# Patient Record
Sex: Female | Born: 1970
Health system: Southern US, Community
[De-identification: ages and names within clinical notes are randomized; demographics above are authoritative.]

## PROBLEM LIST (undated history)

## (undated) DIAGNOSIS — I2510 Atherosclerotic heart disease of native coronary artery without angina pectoris: Secondary | ICD-10-CM

## (undated) DIAGNOSIS — E119 Type 2 diabetes mellitus without complications: Secondary | ICD-10-CM

## (undated) DIAGNOSIS — K219 Gastro-esophageal reflux disease without esophagitis: Secondary | ICD-10-CM

## (undated) DIAGNOSIS — M5136 Other intervertebral disc degeneration, lumbar region: Secondary | ICD-10-CM

## (undated) DIAGNOSIS — I5032 Chronic diastolic (congestive) heart failure: Secondary | ICD-10-CM

## (undated) DIAGNOSIS — M51369 Other intervertebral disc degeneration, lumbar region without mention of lumbar back pain or lower extremity pain: Secondary | ICD-10-CM

## (undated) DIAGNOSIS — F329 Major depressive disorder, single episode, unspecified: Secondary | ICD-10-CM

## (undated) DIAGNOSIS — I10 Essential (primary) hypertension: Secondary | ICD-10-CM

## (undated) DIAGNOSIS — E039 Hypothyroidism, unspecified: Secondary | ICD-10-CM

## (undated) DIAGNOSIS — M5126 Other intervertebral disc displacement, lumbar region: Secondary | ICD-10-CM

## (undated) DIAGNOSIS — E785 Hyperlipidemia, unspecified: Secondary | ICD-10-CM

## (undated) HISTORY — DX: Gastro-esophageal reflux disease without esophagitis: K21.9

## (undated) HISTORY — DX: Atherosclerotic heart disease of native coronary artery without angina pectoris: I25.10

## (undated) HISTORY — DX: Chronic diastolic (congestive) heart failure: I50.32

## (undated) HISTORY — DX: Hypothyroidism, unspecified: E03.9

## (undated) HISTORY — DX: Hyperlipidemia, unspecified: E78.5

## (undated) HISTORY — PX: DILATION AND CURETTAGE OF UTERUS: SHX78

## (undated) HISTORY — DX: Major depressive disorder, single episode, unspecified: F32.9

## (undated) HISTORY — DX: Morbid (severe) obesity due to excess calories: E66.01

---

## 2014-06-21 ENCOUNTER — Encounter (HOSPITAL_COMMUNITY): Admission: EM | Disposition: A | Discharge status: Home / Self Care | Payer: Self-pay | Source: Home / Self Care

## 2014-06-21 ENCOUNTER — Other Ambulatory Visit (HOSPITAL_COMMUNITY): Payer: Self-pay

## 2014-06-21 ENCOUNTER — Emergency Department (HOSPITAL_COMMUNITY): Payer: Self-pay

## 2014-06-21 ENCOUNTER — Emergency Department (HOSPITAL_COMMUNITY): Payer: Self-pay | Admitting: Certified Registered"

## 2014-06-21 ENCOUNTER — Inpatient Hospital Stay (HOSPITAL_COMMUNITY)
Admission: EM | Admit: 2014-06-21 | Discharge: 2014-06-24 | DRG: 345 | Disposition: A | Discharge status: Home / Self Care | Payer: Self-pay | Attending: General Surgery | Admitting: General Surgery

## 2014-06-21 ENCOUNTER — Encounter (HOSPITAL_COMMUNITY): Payer: Self-pay | Admitting: Cardiology

## 2014-06-21 DIAGNOSIS — M726 Necrotizing fasciitis: Secondary | ICD-10-CM

## 2014-06-21 DIAGNOSIS — K611 Rectal abscess: Principal | ICD-10-CM | POA: Diagnosis present

## 2014-06-21 DIAGNOSIS — Z881 Allergy status to other antibiotic agents status: Secondary | ICD-10-CM

## 2014-06-21 DIAGNOSIS — E119 Type 2 diabetes mellitus without complications: Secondary | ICD-10-CM | POA: Diagnosis present

## 2014-06-21 DIAGNOSIS — E282 Polycystic ovarian syndrome: Secondary | ICD-10-CM | POA: Diagnosis present

## 2014-06-21 DIAGNOSIS — N920 Excessive and frequent menstruation with regular cycle: Secondary | ICD-10-CM | POA: Diagnosis present

## 2014-06-21 DIAGNOSIS — G629 Polyneuropathy, unspecified: Secondary | ICD-10-CM | POA: Diagnosis present

## 2014-06-21 DIAGNOSIS — Z91040 Latex allergy status: Secondary | ICD-10-CM

## 2014-06-21 DIAGNOSIS — L0231 Cutaneous abscess of buttock: Secondary | ICD-10-CM | POA: Diagnosis present

## 2014-06-21 DIAGNOSIS — B951 Streptococcus, group B, as the cause of diseases classified elsewhere: Secondary | ICD-10-CM | POA: Diagnosis present

## 2014-06-21 HISTORY — PX: INCISION AND DRAINAGE PERIRECTAL ABSCESS: SHX1804

## 2014-06-21 HISTORY — DX: Other intervertebral disc degeneration, lumbar region: M51.36

## 2014-06-21 HISTORY — DX: Other intervertebral disc degeneration, lumbar region without mention of lumbar back pain or lower extremity pain: M51.369

## 2014-06-21 HISTORY — DX: Essential (primary) hypertension: I10

## 2014-06-21 HISTORY — DX: Type 2 diabetes mellitus without complications: E11.9

## 2014-06-21 HISTORY — DX: Other intervertebral disc displacement, lumbar region: M51.26

## 2014-06-21 LAB — BASIC METABOLIC PANEL
Anion gap: 12 (ref 5–15)
BUN: 10 mg/dL (ref 6–20)
CALCIUM: 8.9 mg/dL (ref 8.9–10.3)
CO2: 22 mmol/L (ref 22–32)
Chloride: 99 mmol/L — ABNORMAL LOW (ref 101–111)
Creatinine, Ser: 0.64 mg/dL (ref 0.44–1.00)
GFR calc non Af Amer: >60 mL/min (ref >60)
Glucose, Bld: 350 mg/dL — ABNORMAL HIGH (ref 65–99)
POTASSIUM: 4.2 mmol/L (ref 3.5–5.1)
Sodium: 133 mmol/L — ABNORMAL LOW (ref 135–145)

## 2014-06-21 LAB — CBC WITH DIFFERENTIAL/PLATELET
BASOS PCT: 0 % (ref 0–1)
Basophils Absolute: 0 10*3/uL (ref 0.0–0.1)
EOS ABS: 0 10*3/uL (ref 0.0–0.7)
EOS PCT: 0 % (ref 0–5)
HEMATOCRIT: 39.6 % (ref 36.0–46.0)
Hemoglobin: 12.9 g/dL (ref 12.0–15.0)
Lymphocytes Relative: 10 % — ABNORMAL LOW (ref 12–46)
Lymphs Abs: 1.6 10*3/uL (ref 0.7–4.0)
MCH: 25.3 pg — ABNORMAL LOW (ref 26.0–34.0)
MCHC: 32.6 g/dL (ref 30.0–36.0)
MCV: 77.6 fL — AB (ref 78.0–100.0)
MONO ABS: 1.1 10*3/uL — AB (ref 0.1–1.0)
Monocytes Relative: 7 % (ref 3–12)
NEUTROS ABS: 12.6 10*3/uL — AB (ref 1.7–7.7)
NEUTROS PCT: 83 % — AB (ref 43–77)
Platelets: 234 10*3/uL (ref 150–400)
RBC: 5.1 MIL/uL (ref 3.87–5.11)
RDW: 14.7 % (ref 11.5–15.5)
WBC: 15.3 10*3/uL — ABNORMAL HIGH (ref 4.0–10.5)

## 2014-06-21 LAB — GLUCOSE, CAPILLARY: GLUCOSE-CAPILLARY: 354 mg/dL — AB (ref 65–99)

## 2014-06-21 LAB — CBG MONITORING, ED
GLUCOSE-CAPILLARY: 317 mg/dL — AB (ref 65–99)
GLUCOSE-CAPILLARY: 354 mg/dL — AB (ref 65–99)

## 2014-06-21 LAB — HCG, QUANTITATIVE, PREGNANCY: hCG, Beta Chain, Quant, S: <1 m[IU]/mL (ref <5)

## 2014-06-21 SURGERY — INCISION AND DRAINAGE, ABSCESS, PERIRECTAL
Anesthesia: General | Site: Buttocks | Laterality: Bilateral

## 2014-06-21 MED ORDER — LEVOTHYROXINE SODIUM 50 MCG PO TABS
50.0000 ug | ORAL_TABLET | Freq: Every day | ORAL | Status: DC
  Administered 2014-06-22 – 2014-06-24 (×3): 50 ug via ORAL
  Filled 2014-06-21 (×3): qty 1

## 2014-06-21 MED ORDER — INSULIN ASPART 100 UNIT/ML ~~LOC~~ SOLN
0.0000 [IU] | Freq: Three times a day (TID) | SUBCUTANEOUS | Status: DC
  Administered 2014-06-21: 20 [IU] via SUBCUTANEOUS
  Administered 2014-06-22: 11 [IU] via SUBCUTANEOUS
  Administered 2014-06-22: 6 [IU] via SUBCUTANEOUS
  Administered 2014-06-22: 7 [IU] via SUBCUTANEOUS
  Administered 2014-06-23: 11 [IU] via SUBCUTANEOUS
  Administered 2014-06-23: 7 [IU] via SUBCUTANEOUS
  Administered 2014-06-23: 15 [IU] via SUBCUTANEOUS
  Administered 2014-06-24: 4 [IU] via SUBCUTANEOUS

## 2014-06-21 MED ORDER — SODIUM CHLORIDE 0.9 % IV BOLUS (SEPSIS)
1000.0000 mL | Freq: Once | INTRAVENOUS | Status: AC
  Administered 2014-06-21: 1000 mL via INTRAVENOUS

## 2014-06-21 MED ORDER — MORPHINE SULFATE 4 MG/ML IJ SOLN
4.0000 mg | Freq: Once | INTRAMUSCULAR | Status: AC
  Administered 2014-06-21: 4 mg via INTRAVENOUS
  Filled 2014-06-21: qty 1

## 2014-06-21 MED ORDER — INSULIN ASPART 100 UNIT/ML ~~LOC~~ SOLN
SUBCUTANEOUS | Status: AC
  Filled 2014-06-21: qty 20

## 2014-06-21 MED ORDER — ONDANSETRON HCL 4 MG/2ML IJ SOLN
4.0000 mg | Freq: Once | INTRAMUSCULAR | Status: AC
  Administered 2014-06-21: 4 mg via INTRAVENOUS
  Filled 2014-06-21: qty 2

## 2014-06-21 MED ORDER — ACETAMINOPHEN 650 MG RE SUPP
650.0000 mg | Freq: Once | RECTAL | Status: AC
  Administered 2014-06-21: 650 mg via RECTAL

## 2014-06-21 MED ORDER — LIDOCAINE HCL (CARDIAC) 20 MG/ML IV SOLN
INTRAVENOUS | Status: DC | PRN
  Administered 2014-06-21: 60 mg via INTRAVENOUS

## 2014-06-21 MED ORDER — 0.9 % SODIUM CHLORIDE (POUR BTL) OPTIME
TOPICAL | Status: DC | PRN
  Administered 2014-06-21: 1000 mL

## 2014-06-21 MED ORDER — ATENOLOL 50 MG PO TABS
50.0000 mg | ORAL_TABLET | Freq: Every day | ORAL | Status: DC
  Administered 2014-06-22 – 2014-06-24 (×3): 50 mg via ORAL
  Filled 2014-06-21 (×3): qty 1

## 2014-06-21 MED ORDER — PIPERACILLIN-TAZOBACTAM 3.375 G IVPB
3.3750 g | Freq: Three times a day (TID) | INTRAVENOUS | Status: DC
  Administered 2014-06-21 – 2014-06-24 (×8): 3.375 g via INTRAVENOUS
  Filled 2014-06-21 (×9): qty 50

## 2014-06-21 MED ORDER — PROPOFOL 10 MG/ML IV BOLUS
INTRAVENOUS | Status: AC
Start: 2014-06-21 — End: 2014-06-21
  Filled 2014-06-21: qty 20

## 2014-06-21 MED ORDER — INSULIN ASPART 100 UNIT/ML ~~LOC~~ SOLN
0.0000 [IU] | Freq: Every day | SUBCUTANEOUS | Status: DC
  Administered 2014-06-22 – 2014-06-23 (×2): 2 [IU] via SUBCUTANEOUS

## 2014-06-21 MED ORDER — MORPHINE SULFATE 4 MG/ML IJ SOLN: 4.0000 mg | Freq: Once | INTRAMUSCULAR | Status: DC

## 2014-06-21 MED ORDER — SODIUM CHLORIDE 0.9 % IV SOLN
INTRAVENOUS | Status: DC
  Administered 2014-06-21 – 2014-06-22 (×3): via INTRAVENOUS

## 2014-06-21 MED ORDER — MORPHINE SULFATE 4 MG/ML IJ SOLN
8.0000 mg | Freq: Once | INTRAMUSCULAR | Status: AC
  Administered 2014-06-21: 8 mg via INTRAVENOUS
  Filled 2014-06-21: qty 2

## 2014-06-21 MED ORDER — PIPERACILLIN-TAZOBACTAM 3.375 G IVPB 30 MIN
3.3750 g | Freq: Once | INTRAVENOUS | Status: AC
  Administered 2014-06-21: 3.375 g via INTRAVENOUS
  Filled 2014-06-21: qty 50

## 2014-06-21 MED ORDER — ACETAMINOPHEN 650 MG RE SUPP
RECTAL | Status: AC
  Filled 2014-06-21: qty 1

## 2014-06-21 MED ORDER — FENTANYL CITRATE (PF) 100 MCG/2ML IJ SOLN: 25.0000 ug | INTRAMUSCULAR | Status: DC | PRN

## 2014-06-21 MED ORDER — SODIUM CHLORIDE 0.9 % IV SOLN
1000.0000 mL | Freq: Once | INTRAVENOUS | Status: AC
  Administered 2014-06-21: 1000 mL via INTRAVENOUS

## 2014-06-21 MED ORDER — VANCOMYCIN HCL IN DEXTROSE 1-5 GM/200ML-% IV SOLN
1000.0000 mg | Freq: Once | INTRAVENOUS | Status: AC
  Administered 2014-06-21: 1000 mg via INTRAVENOUS
  Filled 2014-06-21: qty 200

## 2014-06-21 MED ORDER — MIDAZOLAM HCL 2 MG/2ML IJ SOLN
INTRAMUSCULAR | Status: AC
  Filled 2014-06-21: qty 2

## 2014-06-21 MED ORDER — INSULIN ASPART 100 UNIT/ML ~~LOC~~ SOLN
10.0000 [IU] | Freq: Once | SUBCUTANEOUS | Status: AC
  Administered 2014-06-21: 10 [IU] via SUBCUTANEOUS
  Filled 2014-06-21: qty 0.1

## 2014-06-21 MED ORDER — SODIUM CHLORIDE 0.9 % IV SOLN
INTRAVENOUS | Status: DC | PRN
  Administered 2014-06-21 (×2): via INTRAVENOUS

## 2014-06-21 MED ORDER — FENTANYL CITRATE (PF) 250 MCG/5ML IJ SOLN
INTRAMUSCULAR | Status: AC
  Filled 2014-06-21: qty 5

## 2014-06-21 MED ORDER — ENOXAPARIN SODIUM 40 MG/0.4ML ~~LOC~~ SOLN
40.0000 mg | SUBCUTANEOUS | Status: DC
  Administered 2014-06-23: 40 mg via SUBCUTANEOUS
  Filled 2014-06-21 (×2): qty 0.4

## 2014-06-21 MED ORDER — ONDANSETRON HCL 4 MG/2ML IJ SOLN
4.0000 mg | Freq: Four times a day (QID) | INTRAMUSCULAR | Status: DC | PRN
  Administered 2014-06-22 – 2014-06-24 (×2): 4 mg via INTRAVENOUS
  Filled 2014-06-21 (×2): qty 2

## 2014-06-21 MED ORDER — OXYCODONE-ACETAMINOPHEN 5-325 MG PO TABS
1.0000 | ORAL_TABLET | ORAL | Status: DC | PRN
  Administered 2014-06-22: 2 via ORAL
  Filled 2014-06-21: qty 2

## 2014-06-21 MED ORDER — IOHEXOL 300 MG/ML  SOLN
100.0000 mL | Freq: Once | INTRAMUSCULAR | Status: AC | PRN
  Administered 2014-06-21: 100 mL via INTRAVENOUS

## 2014-06-21 MED ORDER — INSULIN ASPART 100 UNIT/ML ~~LOC~~ SOLN
6.0000 [IU] | Freq: Three times a day (TID) | SUBCUTANEOUS | Status: DC
  Administered 2014-06-22 – 2014-06-23 (×6): 6 [IU] via SUBCUTANEOUS

## 2014-06-21 MED ORDER — PROPOFOL 10 MG/ML IV BOLUS
INTRAVENOUS | Status: DC | PRN
  Administered 2014-06-21: 200 mg via INTRAVENOUS

## 2014-06-21 MED ORDER — GABAPENTIN 300 MG PO CAPS
600.0000 mg | ORAL_CAPSULE | Freq: Every day | ORAL | Status: DC
  Administered 2014-06-21 – 2014-06-23 (×3): 600 mg via ORAL
  Filled 2014-06-21 (×3): qty 2

## 2014-06-21 MED ORDER — HYDROMORPHONE HCL 1 MG/ML IJ SOLN
1.0000 mg | INTRAMUSCULAR | Status: DC | PRN
  Administered 2014-06-22 – 2014-06-24 (×6): 1 mg via INTRAVENOUS
  Filled 2014-06-21 (×6): qty 1

## 2014-06-21 MED ORDER — ONDANSETRON HCL 4 MG PO TABS: 4.0000 mg | ORAL_TABLET | Freq: Four times a day (QID) | ORAL | Status: DC | PRN

## 2014-06-21 MED ORDER — BUPIVACAINE HCL 0.5 % IJ SOLN
INTRAMUSCULAR | Status: DC | PRN
  Administered 2014-06-21: 20 mL

## 2014-06-21 MED ORDER — FENTANYL CITRATE (PF) 100 MCG/2ML IJ SOLN
INTRAMUSCULAR | Status: DC | PRN
  Administered 2014-06-21: 50 ug via INTRAVENOUS
  Administered 2014-06-21: 100 ug via INTRAVENOUS

## 2014-06-21 SURGICAL SUPPLY — 40 items
BARD SURE STEP FOLEY TRAY IMPLANT
BNDG GAUZE ELAST 4 BULKY (GAUZE/BANDAGES/DRESSINGS) ×3 IMPLANT
CANISTER SUCTION 2500CC (MISCELLANEOUS) ×3 IMPLANT
COVER SURGICAL LIGHT HANDLE (MISCELLANEOUS) ×3 IMPLANT
DRAPE UTILITY XL STRL (DRAPES) ×6 IMPLANT
DRSG PAD ABDOMINAL 8X10 ST (GAUZE/BANDAGES/DRESSINGS) ×3 IMPLANT
ELECT CAUTERY BLADE 6.4 (BLADE) ×3 IMPLANT
ELECT REM PT RETURN 9FT ADLT (ELECTROSURGICAL) ×3
ELECTRODE REM PT RTRN 9FT ADLT (ELECTROSURGICAL) ×1 IMPLANT
GAUZE PACKING IODOFORM 1 (PACKING) IMPLANT
GAUZE SPONGE 4X4 12PLY STRL (GAUZE/BANDAGES/DRESSINGS) ×3 IMPLANT
GLOVE BIOGEL PI IND STRL 6.5 (GLOVE) ×2 IMPLANT
GLOVE BIOGEL PI INDICATOR 6.5 (GLOVE) ×4
GLOVE SURG SIGNA 7.5 PF LTX (GLOVE) IMPLANT
GLOVE SURG SS PI 6.5 STRL IVOR (GLOVE) ×3 IMPLANT
GLOVE SURG SS PI 7.5 STRL IVOR (GLOVE) ×3 IMPLANT
GOWN STRL REUS W/ TWL LRG LVL3 (GOWN DISPOSABLE) ×1 IMPLANT
GOWN STRL REUS W/ TWL XL LVL3 (GOWN DISPOSABLE) ×1 IMPLANT
GOWN STRL REUS W/TWL LRG LVL3 (GOWN DISPOSABLE) ×2
GOWN STRL REUS W/TWL XL LVL3 (GOWN DISPOSABLE) ×2
KIT BASIN OR (CUSTOM PROCEDURE TRAY) ×3 IMPLANT
KIT ROOM TURNOVER OR (KITS) ×3 IMPLANT
NEEDLE HYPO 25GX1X1/2 BEV (NEEDLE) ×3 IMPLANT
NS IRRIG 1000ML POUR BTL (IV SOLUTION) ×3 IMPLANT
PACK LITHOTOMY IV (CUSTOM PROCEDURE TRAY) ×3 IMPLANT
PAD ARMBOARD 7.5X6 YLW CONV (MISCELLANEOUS) ×3 IMPLANT
PENCIL BUTTON HOLSTER BLD 10FT (ELECTRODE) ×3 IMPLANT
SPONGE LAP 18X18 X RAY DECT (DISPOSABLE) ×3 IMPLANT
SWAB COLLECTION DEVICE MRSA (MISCELLANEOUS) ×3 IMPLANT
SYR BULB 3OZ (MISCELLANEOUS) IMPLANT
SYR BULB IRRIGATION 50ML (SYRINGE) ×3 IMPLANT
SYR CONTROL 10ML LL (SYRINGE) ×3 IMPLANT
TOWEL OR 17X24 6PK STRL BLUE (TOWEL DISPOSABLE) ×3 IMPLANT
TOWEL OR 17X26 10 PK STRL BLUE (TOWEL DISPOSABLE) ×3 IMPLANT
TRAY FOLEY BAG SILVER LF 16FR (SET/KITS/TRAYS/PACK) ×3 IMPLANT
TUBE ANAEROBIC SPECIMEN COL (MISCELLANEOUS) ×3 IMPLANT
TUBE CONNECTING 12'X1/4 (SUCTIONS) ×1
TUBE CONNECTING 12X1/4 (SUCTIONS) ×2 IMPLANT
UNDERPAD 30X30 INCONTINENT (UNDERPADS AND DIAPERS) ×3 IMPLANT
YANKAUER SUCT BULB TIP NO VENT (SUCTIONS) ×3 IMPLANT

## 2014-06-21 NOTE — ED Notes (Signed)
Surgeon MD at bedside. 

## 2014-06-21 NOTE — Progress Notes (Signed)
After moving to bed from stretcher, pt c/o numbness in RLE. She has strong dorsi & plantar flexion. [facility], also says she has neuropathy. Pt encouraged to bend leg, rotate ankles.  When she did this for awile, she said numbness "felt a little better". Josephine RN will cont to monitor.

## 2014-06-21 NOTE — Progress Notes (Signed)
ANTIBIOTIC CONSULT NOTE  Pharmacy Consult for Zosyn Indication: pneumonia  Allergies  Allergen Reactions  . Latex Rash  . Ultram [Tramadol] Rash    Patient Measurements: Height: 5\' 7"  (170.2 cm) Weight: (!) 300 lb 12.8 oz (136.442 kg) IBW/kg (Calculated) : 61.6 Adjusted Body Weight:   Vital Signs: Temp: 99.4 F (37.4 C) (05/24 2209) Temp Source: Oral (05/24 2209) BP: 112/56 mmHg (05/24 2209) Pulse Rate: 98 (05/24 2209) Intake/Output from previous day:   Intake/Output from this shift: Total I/O In: 1460 [I.V.:1460] Out: 100 [Urine:100]  Labs:  Recent Labs  06/21/14 1438  WBC 15.3*  HGB 12.9  PLT 234  CREATININE 0.64   Estimated Creatinine Clearance: 131 mL/min (by C-G formula based on Cr of 0.64). No results for input(s): VANCOTROUGH, VANCOPEAK, VANCORANDOM, GENTTROUGH, GENTPEAK, GENTRANDOM, TOBRATROUGH, TOBRAPEAK, TOBRARND, AMIKACINPEAK, AMIKACINTROU, AMIKACIN in the last 72 hours.   Microbiology: No results found for this or any previous visit (from the past 720 hour(s)).  Medical History: Past Medical History  Diagnosis Date  . Diabetes mellitus without complication   . Lumbar herniated disc   . DDD (degenerative disc disease), lumbar     Medications:  Prescriptions prior to admission  Medication Sig Dispense Refill Last Dose  . atenolol (TENORMIN) 50 MG tablet Take 50 mg by mouth daily.  3 06/20/2014 at Unknown time  . gabapentin (NEURONTIN) 300 MG capsule Take 600 mg by mouth at bedtime.  3 06/20/2014 at Unknown time  . HUMALOG 100 UNIT/ML injection Inject 10 Units into the skin 3 (three) times daily before meals.  2 06/21/2014 at Unknown time  . LANTUS 100 UNIT/ML injection Inject 45-50 Units into the skin at bedtime. 45 units in the evening but may increase to 50 units if fasting sugar >200  8 06/20/2014 at Unknown time  . levothyroxine (SYNTHROID, LEVOTHROID) 50 MCG tablet Take 50 mcg by mouth daily.  3 06/20/2014 at Unknown time  . omeprazole  (PRILOSEC) 40 MG capsule Take 40 mg by mouth daily.  3 06/21/2014 at Unknown time  . sitaGLIPtin-metformin (JANUMET) 50-1000 MG per tablet Take 1 tablet by mouth 2 (two) times daily with a meal.   06/20/2014 at Unknown time   Assessment: 44 with perirectal abscess, s/p I&D, CT showing possible cellulitis, fascitis. WBC 15.3, Tmax/24h 101.1, BP wnl.  Goal of Therapy:  Resolution of infection  Plan:  Zosyn 3.375 g IV q8h F/u cultures from I&D   Agapito GamesAlison Carmencita Cusic, PharmD, BCPS Clinical Pharmacist Pager: [phone] 06/21/2014 10:26 PM

## 2014-06-21 NOTE — Op Note (Signed)
IRRIGATION AND DEBRIDEMENT PERIRECTAL ABSCESS  Procedure Note  Tarrie Jun 06/21/2014   Pre-op Diagnosis: buttock abscess     Post-op Diagnosis: left buttock/perirectal abscess  Procedure(s): IRRIGATION AND DEBRIDEMENT LEFT BUTTOCK /PERIRECTAL ABSCESS  Surgeon(s): Abigail Miyamotoouglas Traniyah Hallett, MD  Anesthesia: General  Staff:  Circulator: Simonne MaffucciAnita G Mason, RN; Delano MetzValerie D Gibson, RN Scrub Person: Delorse LimberAngelia F Banks, CST Circulator Assistant: Danton Claparlene L Wegner, RN  Estimated Blood Loss: Minimal               Specimens: CULTURES SENT          Danija Gosa A   Date: 06/21/2014  Time: 9:11 PM

## 2014-06-21 NOTE — Anesthesia Postprocedure Evaluation (Signed)
  Anesthesia Post-op Note  Patient: [name], [name]  Procedure(s) Performed: Procedure(s): IRRIGATION AND DEBRIDEMENT PERIRECTAL ABSCESS (Bilateral)  Patient Location: PACU  Anesthesia Type:General  Level of Consciousness: awake  Airway and Oxygen Therapy: Patient Spontanous Breathing  Post-op Pain: mild  Post-op Assessment: Post-op Vital signs reviewed  Post-op Vital Signs: Reviewed  Last Vitals:  Filed Vitals:   06/21/14 2209  BP: 112/56  Pulse: 98  Temp: 37.4 C  Resp: 18    Complications: No apparent anesthesia complications

## 2014-06-21 NOTE — Anesthesia Procedure Notes (Signed)
Procedure Name: Intubation Date/Time: 06/21/2014 8:36 PM Performed by: Arlice ColtMANESS, Ledora Delker B Pre-anesthesia Checklist: Patient identified, Emergency Drugs available, Suction available, Patient being monitored and Timeout performed Patient Re-evaluated:Patient Re-evaluated prior to inductionOxygen Delivery Method: Circle system utilized Preoxygenation: Pre-oxygenation with 100% oxygen Intubation Type: IV induction and Rapid sequence Laryngoscope Size: Mac and 3 Grade View: Grade I Tube type: Oral Tube size: 7.5 mm Number of attempts: 1 Airway Equipment and Method: Stylet Placement Confirmation: ETT inserted through vocal cords under direct vision,  positive ETCO2 and breath sounds checked- equal and bilateral Secured at: 21 cm Tube secured with: Tape Dental Injury: Teeth and Oropharynx as per pre-operative assessment

## 2014-06-21 NOTE — Transfer of Care (Signed)
Immediate Anesthesia Transfer of Care Note  Patient: [name]  Procedure(s) Performed: Procedure(s): IRRIGATION AND DEBRIDEMENT PERIRECTAL ABSCESS (Bilateral)  Patient Location: PACU  Anesthesia Type:General  Level of Consciousness: awake, alert  and oriented  Airway & Oxygen Therapy: Patient Spontanous Breathing  Post-op Assessment: Report given to RN and Post -op Vital signs reviewed and stable  Post vital signs: Reviewed  Last Vitals:  Filed Vitals:   06/21/14 1925  BP: 136/97  Pulse: 111  Temp: 36.9 C  Resp: 22    Complications: No apparent anesthesia complications

## 2014-06-21 NOTE — ED Provider Notes (Signed)
CSN: [id number]     Arrival date & time 06/21/14  1145 History   First MD Initiated Contact with Patient 06/21/14 1502     Chief Complaint  Patient presents with  . Abscess    Patient is a 44 y.o. female presenting with abscess. The history is provided by the patient.  Abscess Abscess location: buttock. Abscess quality: draining   Duration:  1 month Progression:  Worsening Chronicity:  New Context: diabetes   Relieved by:  Nothing Exacerbated by: movement and sitting down. Associated symptoms: fatigue, fever and nausea   Associated symptoms: no vomiting   Associated symptoms comment:  Fever up to 102.7 Pt reports buttock abscess for over a month.  She felt it had improved but over the past 2-3 days it has significant worsened.  She reports fever.  She reports drainage from the wound.    Past Medical History  Diagnosis Date  . Diabetes mellitus without complication   . Lumbar herniated disc   . DDD (degenerative disc disease), lumbar    Past Surgical History  Procedure Laterality Date  . Dilation and curettage of uterus     History reviewed. No pertinent family history. History  Substance Use Topics  . Smoking status: Never Smoker   . Smokeless tobacco: Not on file  . Alcohol Use: No   OB History    No data available     Review of Systems  Constitutional: Positive for fever and fatigue.  Respiratory: Negative for cough.   Gastrointestinal: Positive for nausea. Negative for vomiting and diarrhea.  Genitourinary: Positive for vaginal bleeding.       Menstrual cycle   All other systems reviewed and are negative.     Allergies  Latex and Ultram  Home Medications   Prior to Admission medications   Medication Sig Start Date End Date Taking? Authorizing Provider  atenolol (TENORMIN) 50 MG tablet Take 50 mg by mouth daily. 04/17/14  Yes Historical Provider, MD  gabapentin (NEURONTIN) 300 MG capsule Take 600 mg by mouth at bedtime. 04/25/14  Yes Historical Provider,  MD  HUMALOG 100 UNIT/ML injection Inject 10 Units into the skin 3 (three) times daily before meals. 04/25/14  Yes Historical Provider, MD  LANTUS 100 UNIT/ML injection Inject 45-50 Units into the skin at bedtime. 45 units in the evening but may increase to 50 units if fasting sugar >200 04/23/14  Yes Historical Provider, MD  levothyroxine (SYNTHROID, LEVOTHROID) 50 MCG tablet Take 50 mcg by mouth daily. 04/15/14  Yes Historical Provider, MD  omeprazole (PRILOSEC) 40 MG capsule Take 40 mg by mouth daily. 04/14/14  Yes Historical Provider, MD  sitaGLIPtin-metformin (JANUMET) 50-1000 MG per tablet Take 1 tablet by mouth 2 (two) times daily with a meal.   Yes Historical Provider, MD   BP 181/90 mmHg  Pulse 101  Temp(Src) 98.8 F (37.1 C) (Oral)  Resp 18  Ht  (1.702 m)  Wt 280 lb (127.007 kg)  BMI 43.84 kg/m2  SpO2 100%  LMP 06/21/2014 Physical Exam CONSTITUTIONAL: Well developed/well nourished HEAD: Normocephalic/atraumatic EYES: EOMI/PERRL ENMT: Mucous membranes moist NECK: supple no meningeal signs SPINE/BACK:entire spine nontender CV: S1/S2 noted, no murmurs/rubs/gallops noted LUNGS: Lungs are clear to auscultation bilaterally, no apparent distress ABDOMEN: soft, nontender, no rebound or guarding, bowel sounds noted throughout abdomen.  obese Rectal - evidence of candidal infection in gluteal folds.  She has draining L buttock abscess.  She also has significant tenderness/induration to buttock with ?crepitus.  Female chaperone present NEURO: Pt  is awake/alert/appropriate, moves all extremitiesx4.  No facial droop.   EXTREMITIES: pulses normal/equal, full ROM SKIN: warm, color normal PSYCH: no abnormalities of mood noted, alert and oriented to situation  ED Course  Procedures  CRITICAL CARE Performed by: Joya GaskinsWICKLINE,Zahara Rembert W Total critical care time: 35 Critical care time was exclusive of separately billable procedures and treating other patients. Critical care was necessary to  treat or prevent imminent or life-threatening deterioration. Critical care was time spent personally by me on the following activities: development of treatment plan with patient and/or surrogate as well as nursing, discussions with consultants, evaluation of patient's response to treatment, examination of patient, obtaining history from patient or surrogate, ordering and performing treatments and interventions, ordering and review of laboratory studies, ordering and review of radiographic studies, pulse oximetry and re-evaluation of patient's condition.   3:46 PM Pt with buttock abscess and reported fevers at home ?crepitus on exam.  Will need CT imaging IV antibiotics ordered Pt is clinically stable at this time 3:55 PM D/w CT tech Pt will have CT scan in next 20 minutes 5:08 PM Discussed ct findings with dr Loraine Lerichemark Lovell Sheehanjenkins Given concern for fascitis/subcutaneous air  he requests [facility] Pt informed She is stable at this time 5:35 PM D/w dr Gala Romneydoug blackman with gen surgery We reviewed case including CT findings and crepitus on exam He requests transfer to [place] [facility] I have spoken to ER physician Dr Effie ShyWentz, aware of  Patient has been given IV fluids and IV antibiotics  Labs Review Labs Reviewed  BASIC METABOLIC PANEL - Abnormal; Notable for the following:    Sodium 133 (*)    Chloride 99 (*)    Glucose, Bld 350 (*)    All other components within normal limits  CBC WITH DIFFERENTIAL/PLATELET - Abnormal; Notable for the following:    WBC 15.3 (*)    MCV 77.6 (*)    MCH 25.3 (*)    Neutrophils Relative % 83 (*)    Neutro Abs 12.6 (*)    Lymphocytes Relative 10 (*)    Monocytes Absolute 1.1 (*)    All other components within normal limits  CBG MONITORING, ED - Abnormal; Notable for the following:    Glucose-Capillary 317 (*)    All other components within normal limits  HCG, QUANTITATIVE, PREGNANCY    Imaging Review Ct Pelvis W Contrast  06/21/2014    CLINICAL DATA:  Left buttock pain with localized swelling  EXAM: CT PELVIS WITH CONTRAST  TECHNIQUE: Multidetector CT imaging of the pelvis was performed using the standard protocol following the bolus administration of intravenous contrast.  CONTRAST:  100mL OMNIPAQUE IOHEXOL 300 MG/ML  SOLN  COMPARISON:  None.  FINDINGS: The bladder, uterus and ovaries are within normal limits with the exception of a right ovarian cyst which measures 3.2 cm in greatest dimension. No free pelvic fluid is noted. The osseous structures are within normal limits.  In the posterior medial left gluteal region there is subcutaneous inflammatory change as well as a considerable amount of air throughout the subcutaneous fat medially. This extends inferiorly towards the anus but does not appear to approximate the anus. A small 3.2 cm subcutaneous abscess is noted along the medial aspect of the left buttock.  IMPRESSION: Changes of cellulitis/fasciitis in the medial left buttock with apparent extending inferiorly towards the anus. A small air-fluid collection is noted in the medial aspect of the left buttock which measures 3.2 by 1.8 cm consistent with a small  subcutaneous abscess.   Electronically Signed   By: Alcide Clever M.D.   On: 06/21/2014 16:49     Medications  vancomycin (VANCOCIN) IVPB 1000 mg/200 mL premix (not administered)  piperacillin-tazobactam (ZOSYN) IVPB 3.375 g (not administered)  morphine 4 MG/ML injection 8 mg (8 mg Intravenous Given 06/21/14 1535)  ondansetron (ZOFRAN) injection 4 mg (4 mg Intravenous Given 06/21/14 1535)  sodium chloride 0.9 % bolus 1,000 mL (1,000 mLs Intravenous New Bag/Given 06/21/14 1535)    MDM   Final diagnoses:  Necrotizing fasciitis    Nursing notes including past medical history and social history reviewed and considered in documentation Labs/vital reviewed myself and considered during evaluation     Zadie Rhine, MD 06/21/14 1737

## 2014-06-21 NOTE — ED Notes (Addendum)
Abscess to buttocks.  C/o high fever.

## 2014-06-21 NOTE — ED Provider Notes (Signed)
The patient appears reasonably stabilized for transfer considering the current resources, flow, and capabilities available in the ED at this time, and I doubt any other [facility] further screening and/or treatment in the ED prior to transfer.  BP - 111/57 at this time   Zadie Rhineonald Sanna Porcaro, MD 06/21/14 [phone]

## 2014-06-21 NOTE — H&P (Signed)
[name] is an 44 y.o. female.   Chief Complaint: Buttock/perirectal abscess HPI: She is transferred down from [facility] for a left buttock/perianal abscess. CT findings were worrisome for possible cellulitis/fasciitis. She reports having discomfort for several days which acutely worsened today. She has fever. She has no previous history of similar abscesses or infections. She denies shortness of breath. She complains of peripheral neuropathy which is chronic. She's been moving her bowels normally.  Past Medical History  Diagnosis Date  . Diabetes mellitus without complication   . Lumbar herniated disc   . DDD (degenerative disc disease), lumbar     Past Surgical History  Procedure Laterality Date  . Dilation and curettage of uterus      History reviewed. No pertinent family history. Social History:  reports that she has never smoked. She does not have any smokeless tobacco history on file. She reports that she does not drink alcohol or use illicit drugs.  Allergies:  Allergies  Allergen Reactions  . Latex Rash  . Ultram [Tramadol] Rash     (Not in a hospital admission)  Results for orders placed or performed during the hospital encounter of 06/21/14 (from the past 48 hour(s))  CBG monitoring, ED     Status: Abnormal   Collection Time: 06/21/14  1:44 PM  Result Value Ref Range   Glucose-Capillary 317 (H) 65 - 99 mg/dL  Basic metabolic panel     Status: Abnormal   Collection Time: 06/21/14  2:38 PM  Result Value Ref Range   Sodium 133 (L) 135 - 145 mmol/L   Potassium 4.2 3.5 - 5.1 mmol/L   Chloride 99 (L) 101 - 111 mmol/L   CO2 22 22 - 32 mmol/L   Glucose, Bld 350 (H) 65 - 99 mg/dL   BUN 10 6 - 20 mg/dL   Creatinine, Ser 0.64 0.44 - 1.00 mg/dL   Calcium 8.9 8.9 - 10.3 mg/dL   GFR calc non Af Amer >60 >60 mL/min   GFR calc Af Amer >60 >60 mL/min    Comment: (NOTE) The eGFR has been calculated using the CKD EPI equation. This calculation has not been  validated in all clinical situations. eGFR's persistently <60 mL/min signify possible Chronic Kidney Disease.    Anion gap 12 5 - 15  CBC with Differential/Platelet     Status: Abnormal   Collection Time: 06/21/14  2:38 PM  Result Value Ref Range   WBC 15.3 (H) 4.0 - 10.5 K/uL   RBC 5.10 3.87 - 5.11 MIL/uL   Hemoglobin 12.9 12.0 - 15.0 g/dL   HCT 39.6 36.0 - 46.0 %   MCV 77.6 (L) 78.0 - 100.0 fL   MCH 25.3 (L) 26.0 - 34.0 pg   MCHC 32.6 30.0 - 36.0 g/dL   RDW 14.7 11.5 - 15.5 %   Platelets 234 150 - 400 K/uL   Neutrophils Relative % 83 (H) 43 - 77 %   Neutro Abs 12.6 (H) 1.7 - 7.7 K/uL   Lymphocytes Relative 10 (L) 12 - 46 %   Lymphs Abs 1.6 0.7 - 4.0 K/uL   Monocytes Relative 7 3 - 12 %   Monocytes Absolute 1.1 (H) 0.1 - 1.0 K/uL   Eosinophils Relative 0 0 - 5 %   Eosinophils Absolute 0.0 0.0 - 0.7 K/uL   Basophils Relative 0 0 - 1 %   Basophils Absolute 0.0 0.0 - 0.1 K/uL  hCG, quantitative, pregnancy     Status: None   Collection  Time: 06/21/14  2:38 PM  Result Value Ref Range   hCG, Beta Chain, Quant, S <1 <5 mIU/mL    Comment:          GEST. AGE      CONC.  (mIU/mL)   <=1 WEEK        5 - 50     2 WEEKS       50 - 500     3 WEEKS       100 - 10,000     4 WEEKS     1,000 - 30,000     5 WEEKS     3,500 - 115,000   6-8 WEEKS     12,000 - 270,000    12 WEEKS     15,000 - 220,000        FEMALE AND NON-PREGNANT FEMALE:     LESS THAN 5 mIU/mL   CBG monitoring, ED     Status: Abnormal   Collection Time: 06/21/14  5:54 PM  Result Value Ref Range   Glucose-Capillary 354 (H) 65 - 99 mg/dL   Comment 1 Notify RN    Ct Pelvis W Contrast  06/21/2014   CLINICAL DATA:  Left buttock pain with localized swelling  EXAM: CT PELVIS WITH CONTRAST  TECHNIQUE: Multidetector CT imaging of the pelvis was performed using the standard protocol following the bolus administration of intravenous contrast.  CONTRAST:  156m OMNIPAQUE IOHEXOL 300 MG/ML  SOLN  COMPARISON:  None.  FINDINGS: The  bladder, uterus and ovaries are within normal limits with the exception of a right ovarian cyst which measures 3.2 cm in greatest dimension. No free pelvic fluid is noted. The osseous structures are within normal limits.  In the posterior medial left gluteal region there is subcutaneous inflammatory change as well as a considerable amount of air throughout the subcutaneous fat medially. This extends inferiorly towards the anus but does not appear to approximate the anus. A small 3.2 cm subcutaneous abscess is noted along the medial aspect of the left buttock.  IMPRESSION: Changes of cellulitis/fasciitis in the medial left buttock with apparent extending inferiorly towards the anus. A small air-fluid collection is noted in the medial aspect of the left buttock which measures 3.2 by 1.8 cm consistent with a small subcutaneous abscess.   Electronically Signed   By: MInez CatalinaM.D.   On: 06/21/2014 16:49    ROS  Blood pressure 136/97, pulse 111, temperature 98.5 F (36.9 C), temperature source Oral, resp. rate 22, height _0  (1.702 m), weight 127.007 kg (280 lb), last menstrual period 06/21/2014, SpO2 94 %. Physical Exam  Constitutional: She is oriented to person, place, and time. She appears distressed.  Morbidly obese  HENT:  Head: Normocephalic and atraumatic.  Right Ear: External ear normal.  Left Ear: External ear normal.  Eyes: Pupils are equal, round, and reactive to light.  Neck: Normal range of motion. Neck supple. No tracheal deviation present.  Cardiovascular: Normal rate, regular rhythm, normal heart sounds and intact distal pulses.   No murmur heard. Respiratory: Effort normal and breath sounds normal. No respiratory distress. She has no wheezes.  GI: Soft. Bowel sounds are normal. She exhibits no distension. There is no tenderness. There is no rebound.  Genitourinary:  There is an area of induration and cellulitis at the left medial buttock near the anus. There is also a tiny area  on the right side which may have some purulence as well.  Musculoskeletal: Normal range of  motion. She exhibits edema and tenderness.  Neurological: She is alert and oriented to person, place, and time.  Skin: Skin is warm. She is diaphoretic. There is erythema.  Psychiatric: Her behavior is normal.     Assessment/Plan Perirectal/buttock abscess  The CT scan is definitely more impressive than a physical examination. Nonetheless, she needs emergent incision, drainage, and possible debridement of the buttock/perirectal abscess. I explained the diagnosis to the patient and her boyfriend.  I explained the potential severity of the situation. If this is larger than expected, it may need significant debridement and multiple trips to the operating room. I discussed the risk of surgery which includes but is not limited to bleeding, infection, injury to surrounding structures, need for further surgery, cardiopulmonary issues, DVT, prolonged intubation, etc. They understand and she wishes to proceed emergently.  Bow Buntyn A 06/21/2014, 7:29 PM

## 2014-06-21 NOTE — Anesthesia Preprocedure Evaluation (Addendum)
Anesthesia Evaluation  Patient identified by MRN, date of birth, ID band Patient awake    Reviewed: Allergy & Precautions, NPO status , Patient's Chart, lab work & pertinent test results  History of Anesthesia Complications Negative for: history of anesthetic complications  Airway Mallampati: II  TM Distance: >3 FB Neck ROM: Full    Dental  (+) Teeth Intact, Dental Advisory Given   Pulmonary neg pulmonary ROS,  breath sounds clear to auscultation        Cardiovascular negative cardio ROS  Rhythm:Regular Rate:Normal     Neuro/Psych    GI/Hepatic Neg liver ROS, GERD-  ,  Endo/Other  diabetes, Type 2, Insulin DependentHypothyroidism Morbid obesity  Renal/GU negative Renal ROS     Musculoskeletal  (+) Arthritis -,   Abdominal   Peds  Hematology   Anesthesia Other Findings   Reproductive/Obstetrics                            Anesthesia Physical Anesthesia Plan  ASA: III and emergent  Anesthesia Plan: General   Post-op Pain Management:    Induction: Intravenous, Rapid sequence and Cricoid pressure planned  Airway Management Planned: Oral ETT  Additional Equipment:   Intra-op Plan:   Post-operative Plan: Extubation in OR  Informed Consent: I have reviewed the patients History and Physical, chart, labs and discussed the procedure including the risks, benefits and alternatives for the proposed anesthesia with the patient or authorized representative who has indicated his/her understanding and acceptance.   Dental advisory given  Plan Discussed with: Anesthesiologist, Surgeon and CRNA  Anesthesia Plan Comments:        Anesthesia Quick Evaluation

## 2014-06-22 ENCOUNTER — Encounter (HOSPITAL_COMMUNITY): Payer: Self-pay | Admitting: General Practice

## 2014-06-22 LAB — GLUCOSE, CAPILLARY
GLUCOSE-CAPILLARY: 236 mg/dL — AB (ref 65–99)
GLUCOSE-CAPILLARY: 232 mg/dL — AB (ref 65–99)
Glucose-Capillary: 323 mg/dL — ABNORMAL HIGH (ref 65–99)
Glucose-Capillary: 248 mg/dL — ABNORMAL HIGH (ref 65–99)
Glucose-Capillary: 295 mg/dL — ABNORMAL HIGH (ref 65–99)

## 2014-06-22 LAB — BASIC METABOLIC PANEL
Anion gap: 10 (ref 5–15)
BUN: 7 mg/dL (ref 6–20)
CALCIUM: 8.1 mg/dL — AB (ref 8.9–10.3)
CO2: 21 mmol/L — AB (ref 22–32)
CREATININE: 0.65 mg/dL (ref 0.44–1.00)
Chloride: 104 mmol/L (ref 101–111)
GFR calc non Af Amer: >60 mL/min (ref >60)
GLUCOSE: 272 mg/dL — AB (ref 65–99)
Potassium: 3.8 mmol/L (ref 3.5–5.1)
SODIUM: 135 mmol/L (ref 135–145)

## 2014-06-22 LAB — CBC
HCT: 32.9 % — ABNORMAL LOW (ref 36.0–46.0)
Hemoglobin: 10.8 g/dL — ABNORMAL LOW (ref 12.0–15.0)
MCH: 25.5 pg — AB (ref 26.0–34.0)
MCHC: 32.8 g/dL (ref 30.0–36.0)
MCV: 77.8 fL — AB (ref 78.0–100.0)
Platelets: 186 10*3/uL (ref 150–400)
RBC: 4.23 MIL/uL (ref 3.87–5.11)
RDW: 15.1 % (ref 11.5–15.5)
WBC: 12.2 10*3/uL — ABNORMAL HIGH (ref 4.0–10.5)

## 2014-06-22 MED ORDER — OXYCODONE-ACETAMINOPHEN 5-325 MG PO TABS
1.0000 | ORAL_TABLET | Freq: Four times a day (QID) | ORAL | Status: DC | PRN
  Administered 2014-06-23 – 2014-06-24 (×3): 2 via ORAL
  Filled 2014-06-22 (×3): qty 2

## 2014-06-22 MED ORDER — INSULIN GLARGINE 100 UNIT/ML ~~LOC~~ SOLN
45.0000 [IU] | Freq: Every day | SUBCUTANEOUS | Status: DC
  Administered 2014-06-22 – 2014-06-23 (×2): 45 [IU] via SUBCUTANEOUS
  Filled 2014-06-22 (×3): qty 0.45

## 2014-06-22 MED ORDER — DIPHENHYDRAMINE HCL 25 MG PO CAPS
50.0000 mg | ORAL_CAPSULE | Freq: Four times a day (QID) | ORAL | Status: DC | PRN
  Administered 2014-06-22: 50 mg via ORAL
  Filled 2014-06-22: qty 2

## 2014-06-22 MED ORDER — HYDROCODONE-ACETAMINOPHEN 5-325 MG PO TABS: 1.0000 | ORAL_TABLET | ORAL | Status: DC | PRN

## 2014-06-22 NOTE — Op Note (Signed)
NAMDebera Lat:  Olden, [name]             ACCOUNT NO.:  [id number]  MEDICAL RECORD NO.:  [id number]  LOCATION:  6N07C                        FACILITY:  [facility]  PHYSICIAN:  Abigail Miyamotoouglas Andersyn Fragoso, M.D. DATE OF BIRTH:  [birth year]  DATE OF PROCEDURE:  06/21/2014 DATE OF DISCHARGE:                              OPERATIVE REPORT   PREOPERATIVE DIAGNOSIS:  Left buttock/perirectal abscess.  POSTOPERATIVE DIAGNOSIS:  Left buttock/perirectal abscess.  PROCEDURE:  Incision and drainage of left buttock/perirectal abscess.  SURGEON:  Abigail Miyamotoouglas Labresha Mellor, M.D.  ANESTHESIA:  General and 0.5% Marcaine.  ESTIMATED BLOOD LOSS:  Minimal.  INDICATIONS:  This is a 44 year old female, who presented to the [facility] with a draining perirectal/left buttock abscess.  A CAT scan was performed, which showed a moderate amount of cellulitis and air in the abscess and findings worrisome for possible fasciitis, so she was [facility].  FINDINGS:  The patient was found to have a large medial left buttock/perirectal abscess.  Cultures were obtained of the fluid.  This did not appear to be consistent with necrotizing fasciitis.  PROCEDURE IN DETAIL:  The patient was brought to the operating room, identified as [name], she was placed supine on the operating room table, and general anesthesia was induced.  The patient was then placed in lithotomy position, and a Foley catheter was inserted.  Her perianal area was then prepped and draped in usual sterile fashion. There was an area of fluctuance in the left perianal area.  I opened this area up with a scalpel where I found a large abscess cavity.  I then probed extensively medially, laterally, and posteriorly and found no evidence of necrotizing fasciitis.  There was no necrotic tissue in the wound.  I then anesthetized the wound circumferentially with Marcaine.  I irrigated with saline and then packed it with wet-to-dry saline gauze.  Dry  gauze was then placed over top of this.  The patient tolerated the procedure well.  All the counts were correct at the end of the procedure.  The patient was then extubated in the operating room and taken in stable condition to the recovery room.     Abigail Miyamotoouglas [name], M.D.    DB/MEDQ  D:  06/21/2014  T:  06/22/2014  Job:  [id number]

## 2014-06-22 NOTE — Progress Notes (Signed)
Inpatient Diabetes Program Recommendations  AACE/ADA: New Consensus Statement on Inpatient Glycemic Control (2013)  Target Ranges:  Prepandial:   less than 140 mg/dL      Peak postprandial:   less than 180 mg/dL (1-2 hours)      Critically ill patients:  140 - 180 mg/dL   Reason for Visit: Hyperglycemia  Diabetes history: DM2 Outpatient Diabetes medications: Lantus 45-50 units QHS, Humalog 10 units tidwc, Janumet 50/1000 mg bid Current orders for Inpatient glycemic control: Lantus 45 units QHS, Novolog resistant tidwc and hs+ 6 units tidwc,   Results for [name], [name] (MRN [id number]) as of 06/22/2014 10:48  Ref. Range 06/21/2014 13:44 06/21/2014 17:54 06/21/2014 21:22 06/22/2014 08:06  Glucose-Capillary Latest Ref Range: 65-99 mg/dL 409317 (H) 811354 (H) 914354 (H) 248 (H)   Inpatient Diabetes Program Recommendations Insulin - Basal: To begin Lantus 45 units QHS tonight. No basal insulin ordered last night. Correction (SSI): Novolog resistant tidwc and hs Insulin - Meal Coverage: If post-prandial blood sugars > 180 mg/dL, increase Novolog to 8 units tidwc HgbA1C: Check HgbA1C to assess glycemic control prior to hospitalization  Note: Will follow while inpatient.  Thank you. Ailene Ardshonda Mariaceleste Herrera, RD, LDN, CDE Inpatient Diabetes Coordinator [phone]

## 2014-06-22 NOTE — Progress Notes (Signed)
Pt still complaining of right foot numbness; Bilateral dorsalis pedis pulse are strong, skin warm to touch equally, and pt able to do full ROM of the foot. Pt stated symptom started before surgery; Pt with hx of neuropathy. Dr. Magnus IvanBlackman made aware.

## 2014-06-22 NOTE — Progress Notes (Signed)
Patient ID: [name], female   DOB: Mar 09, 1970, 44 y.o.   MRN: [id number]     [place]      [address] [place], [place] [address]    Phone: [phone] FAX: [phone]     Subjective: Sore, but better.  Dizzy when she turned.    Objective:  Vital signs:  Filed Vitals:   06/21/14 2207 06/21/14 2209 06/22/14 0229 06/22/14 0620  BP:  112/56 116/61 117/69  Pulse:  98 93 95  Temp:  99.4 F (37.4 C) 98.5 F (36.9 C) 99.3 F (37.4 C)  TempSrc:  Oral Oral Oral  Resp:  '18 18 18  ' Height: '5\' 7"'  (1.702 m)     Weight: 136.442 kg (300 lb 12.8 oz)     SpO2:  95% 99% 97%    Last BM Date: 06/22/14  Intake/Output   Yesterday:  05/24 0701 - 05/25 0700 In: 2735.4 [P.O.:240; I.V.:2445.4; IV Piggyback:50] Out: 1100 [Urine:1100] This shift:    I/O last 3 completed shifts: In: 2735.4 [P.O.:240; I.V.:2445.4; IV Piggyback:50] Out: 1100 [Urine:1100]    Physical Exam: General: Pt awake/alert/oriented x4 in no acute distress Abdomen: Soft.  Nondistended.  *non tender. No evidence of peritonitis.  No incarcerated hernias. Skin:dressing is dry, menorrhagia.     Problem List:   Active Problems:   Perirectal abscess    Results:   Labs: Results for orders placed or performed during the hospital encounter of 06/21/14 (from the past 48 hour(s))  CBG monitoring, ED     Status: Abnormal   Collection Time: 06/21/14  1:44 PM  Result Value Ref Range   Glucose-Capillary 317 (H) 65 - 99 mg/dL  Basic metabolic panel     Status: Abnormal   Collection Time: 06/21/14  2:38 PM  Result Value Ref Range   Sodium 133 (L) 135 - 145 mmol/L   Potassium 4.2 3.5 - 5.1 mmol/L   Chloride 99 (L) 101 - 111 mmol/L   CO2 22 22 - 32 mmol/L   Glucose, Bld 350 (H) 65 - 99 mg/dL   BUN 10 6 - 20 mg/dL   Creatinine, Ser 0.64 0.44 - 1.00 mg/dL   Calcium 8.9 8.9 - 10.3 mg/dL   GFR calc non Af Amer >60 >60 mL/min   GFR calc Af Amer >60 >60 mL/min   Comment: (NOTE) The eGFR has been calculated using the CKD EPI equation. This calculation has not been validated in all clinical situations. eGFR's persistently <60 mL/min signify possible Chronic Kidney Disease.    Anion gap 12 5 - 15  CBC with Differential/Platelet     Status: Abnormal   Collection Time: 06/21/14  2:38 PM  Result Value Ref Range   WBC 15.3 (H) 4.0 - 10.5 K/uL   RBC 5.10 3.87 - 5.11 MIL/uL   Hemoglobin 12.9 12.0 - 15.0 g/dL   HCT 39.6 36.0 - 46.0 %   MCV 77.6 (L) 78.0 - 100.0 fL   MCH 25.3 (L) 26.0 - 34.0 pg   MCHC 32.6 30.0 - 36.0 g/dL   RDW 14.7 11.5 - 15.5 %   Platelets 234 150 - 400 K/uL   Neutrophils Relative % 83 (H) 43 - 77 %   Neutro Abs 12.6 (H) 1.7 - 7.7 K/uL   Lymphocytes Relative 10 (L) 12 - 46 %   Lymphs Abs 1.6 0.7 - 4.0 K/uL   Monocytes Relative 7 3 - 12 %   Monocytes Absolute 1.1 (H) 0.1 -  1.0 K/uL   Eosinophils Relative 0 0 - 5 %   Eosinophils Absolute 0.0 0.0 - 0.7 K/uL   Basophils Relative 0 0 - 1 %   Basophils Absolute 0.0 0.0 - 0.1 K/uL  hCG, quantitative, pregnancy     Status: None   Collection Time: 06/21/14  2:38 PM  Result Value Ref Range   hCG, Beta Chain, Quant, S <1 <5 mIU/mL    Comment:          GEST. AGE      CONC.  (mIU/mL)   <=1 WEEK        5 - 50     2 WEEKS       50 - 500     3 WEEKS       100 - 10,000     4 WEEKS     1,000 - 30,000     5 WEEKS     3,500 - 115,000   6-8 WEEKS     12,000 - 270,000    12 WEEKS     15,000 - 220,000        FEMALE AND NON-PREGNANT FEMALE:     LESS THAN 5 mIU/mL   CBG monitoring, ED     Status: Abnormal   Collection Time: 06/21/14  5:54 PM  Result Value Ref Range   Glucose-Capillary 354 (H) 65 - 99 mg/dL   Comment 1 Notify RN   Glucose, capillary     Status: Abnormal   Collection Time: 06/21/14  9:22 PM  Result Value Ref Range   Glucose-Capillary 354 (H) 65 - 99 mg/dL  CBC     Status: Abnormal   Collection Time: 06/22/14  5:17 AM  Result Value Ref Range   WBC 12.2 (H) 4.0 - 10.5  K/uL   RBC 4.23 3.87 - 5.11 MIL/uL   Hemoglobin 10.8 (L) 12.0 - 15.0 g/dL   HCT 32.9 (L) 36.0 - 46.0 %   MCV 77.8 (L) 78.0 - 100.0 fL   MCH 25.5 (L) 26.0 - 34.0 pg   MCHC 32.8 30.0 - 36.0 g/dL   RDW 15.1 11.5 - 15.5 %   Platelets 186 150 - 400 K/uL  Basic metabolic panel     Status: Abnormal   Collection Time: 06/22/14  5:17 AM  Result Value Ref Range   Sodium 135 135 - 145 mmol/L   Potassium 3.8 3.5 - 5.1 mmol/L   Chloride 104 101 - 111 mmol/L   CO2 21 (L) 22 - 32 mmol/L   Glucose, Bld 272 (H) 65 - 99 mg/dL   BUN 7 6 - 20 mg/dL   Creatinine, Ser 0.65 0.44 - 1.00 mg/dL   Calcium 8.1 (L) 8.9 - 10.3 mg/dL   GFR calc non Af Amer >60 >60 mL/min   GFR calc Af Amer >60 >60 mL/min    Comment: (NOTE) The eGFR has been calculated using the CKD EPI equation. This calculation has not been validated in all clinical situations. eGFR's persistently <60 mL/min signify possible Chronic Kidney Disease.    Anion gap 10 5 - 15  Glucose, capillary     Status: Abnormal   Collection Time: 06/22/14  8:06 AM  Result Value Ref Range   Glucose-Capillary 248 (H) 65 - 99 mg/dL   Comment 1 Repeat Test     Imaging / Studies: Ct Pelvis W Contrast  06/21/2014   CLINICAL DATA:  Left buttock pain with localized swelling  EXAM: CT PELVIS WITH CONTRAST  TECHNIQUE: Multidetector  CT imaging of the pelvis was performed using the standard protocol following the bolus administration of intravenous contrast.  CONTRAST:  140m OMNIPAQUE IOHEXOL 300 MG/ML  SOLN  COMPARISON:  None.  FINDINGS: The bladder, uterus and ovaries are within normal limits with the exception of a right ovarian cyst which measures 3.2 cm in greatest dimension. No free pelvic fluid is noted. The osseous structures are within normal limits.  In the posterior medial left gluteal region there is subcutaneous inflammatory change as well as a considerable amount of air throughout the subcutaneous fat medially. This extends inferiorly towards the anus but  does not appear to approximate the anus. A small 3.2 cm subcutaneous abscess is noted along the medial aspect of the left buttock.  IMPRESSION: Changes of cellulitis/fasciitis in the medial left buttock with apparent extending inferiorly towards the anus. A small air-fluid collection is noted in the medial aspect of the left buttock which measures 3.2 by 1.8 cm consistent with a small subcutaneous abscess.   Electronically Signed   By: MInez CatalinaM.D.   On: 06/21/2014 16:49    Medications / Allergies:  Scheduled Meds: . atenolol  50 mg Oral Daily  . enoxaparin (LOVENOX) injection  40 mg Subcutaneous Q24H  . gabapentin  600 mg Oral QHS  . insulin aspart      . insulin aspart  0-20 Units Subcutaneous TID WC  . insulin aspart  0-5 Units Subcutaneous QHS  . insulin aspart  6 Units Subcutaneous TID WC  . levothyroxine  50 mcg Oral Daily  .  morphine injection  4 mg Intravenous Once  . piperacillin-tazobactam (ZOSYN)  IV  3.375 g Intravenous 3 times per day   Continuous Infusions: . sodium chloride 125 mL/hr at 06/22/14 0612   PRN Meds:.HYDROmorphone (DILAUDID) injection, ondansetron **OR** ondansetron (ZOFRAN) IV, oxyCODONE-acetaminophen  Antibiotics: Anti-infectives    Start     Dose/Rate Route Frequency Ordered Stop   06/22/14 0030  piperacillin-tazobactam (ZOSYN) IVPB 3.375 g     3.375 g 12.5 mL/hr over 240 Minutes Intravenous 3 times per day 06/21/14 2225     06/21/14 1600  vancomycin (VANCOCIN) IVPB 1000 mg/200 mL premix     1,000 mg 200 mL/hr over 60 Minutes Intravenous  Once 06/21/14 1546 06/21/14 1928   06/21/14 1600  piperacillin-tazobactam (ZOSYN) IVPB 3.375 g     3.375 g 100 mL/hr over 30 Minutes Intravenous  Once 06/21/14 1546 06/21/14 1711        Assessment/Plan POD#1 I&D perirectal abscess--Dr. BNinfa Linden-will start dressing changes tomorrow -follow cultures -zosyn D#1 -mobilize, IS DM II-elevated, CBGs/SSI.  Resume latus, hold janumet. PCOS/menorrhagia-hgb may  be down some VTE prophylaxis-SCD/lovenox  EErby Pian [facility] Pager [phone] For consults and floor pages call [phone](7A-4:30P)  06/22/2014 8:36 AM

## 2014-06-23 LAB — BASIC METABOLIC PANEL
Anion gap: 7 (ref 5–15)
BUN: 7 mg/dL (ref 6–20)
CALCIUM: 8 mg/dL — AB (ref 8.9–10.3)
CHLORIDE: 105 mmol/L (ref 101–111)
CO2: 23 mmol/L (ref 22–32)
CREATININE: 0.53 mg/dL (ref 0.44–1.00)
GFR calc non Af Amer: >60 mL/min (ref >60)
GLUCOSE: 248 mg/dL — AB (ref 65–99)
POTASSIUM: 3.8 mmol/L (ref 3.5–5.1)
SODIUM: 135 mmol/L (ref 135–145)

## 2014-06-23 LAB — CBC
HEMATOCRIT: 33.2 % — AB (ref 36.0–46.0)
HEMOGLOBIN: 10.6 g/dL — AB (ref 12.0–15.0)
MCH: 24.6 pg — AB (ref 26.0–34.0)
MCHC: 31.9 g/dL (ref 30.0–36.0)
MCV: 77 fL — ABNORMAL LOW (ref 78.0–100.0)
Platelets: 196 10*3/uL (ref 150–400)
RBC: 4.31 MIL/uL (ref 3.87–5.11)
RDW: 14.8 % (ref 11.5–15.5)
WBC: 7.2 10*3/uL (ref 4.0–10.5)

## 2014-06-23 LAB — GLUCOSE, CAPILLARY
GLUCOSE-CAPILLARY: 262 mg/dL — AB (ref 65–99)
Glucose-Capillary: 230 mg/dL — ABNORMAL HIGH (ref 65–99)
Glucose-Capillary: 314 mg/dL — ABNORMAL HIGH (ref 65–99)
Glucose-Capillary: 238 mg/dL — ABNORMAL HIGH (ref 65–99)

## 2014-06-23 NOTE — Care Management Note (Addendum)
Case Management Note  Patient Details  Name: Orland MustardCrystal Williamson MRN: [id number] Date of Birth: 05/05/1970  Subjective/Objective:                    Action/Plan: Confirmed face sheet information with patient , states her boyfriend can also assit with dressing changes at home . Referral given to [facility]   Expected Discharge Date:     06-24-14              Expected Discharge Plan:  Home with home health   In-House Referral:     Discharge planning Services  CM Consult  Post Acute Care Choice:  Home Health Choice offered to:     DME Arranged:    DME Agency:     HH Arranged:  RN HH Agency:  [facility]  Status of Service:     Medicare Important Message Given:    Date Medicare IM Given:    Medicare IM give by:    Date Additional Medicare IM Given:    Additional Medicare Important Message give by:     If discussed at Long Length of Stay Meetings, dates discussed:    Additional Comments:  Kingsley PlanWile, Cyndee Giammarco Marie, RN 06/23/2014, 12:35 PM

## 2014-06-23 NOTE — Progress Notes (Signed)
Patient ID: [name], female   DOB: 11/22/1970, 44 y.o.   MRN: [id number] 2 Days Post-Op  Subjective: Sore, but feels better than before.  Objective: Vital signs in last 24 hours: Temp:  [97.8 F (36.6 C)-98.9 F (37.2 C)] 98.9 F (37.2 C) (05/26 0701) Pulse Rate:  [80-95] 86 (05/26 0701) Resp:  [18] 18 (05/26 0701) BP: (98-122)/(56-66) 122/66 mmHg (05/26 0701) SpO2:  [95 %-99 %] 95 % (05/26 0701) Last BM Date: 06/22/14 (prior to surgery)  Intake/Output from previous day: 05/25 0701 - 05/26 0700 In: 1448.8 [P.O.:600; I.V.:798.8; IV Piggyback:50] Out: 1000 [Urine:1000] Intake/Output this shift:    PE: Skin: some mild induration around wound, but no significant cellulitis, wound is clean with no purulent drainage noted  Lab Results:   Recent Labs  06/22/14 0517 06/23/14 0357  WBC 12.2* 7.2  HGB 10.8* 10.6*  HCT 32.9* 33.2*  PLT 186 196   BMET  Recent Labs  06/22/14 0517 06/23/14 0357  NA 135 135  K 3.8 3.8  CL 104 105  CO2 21* 23  GLUCOSE 272* 248*  BUN 7 7  CREATININE 0.65 0.53  CALCIUM 8.1* 8.0*   PT/INR No results for input(s): LABPROT, INR in the last 72 hours. CMP     Component Value Date/Time   NA 135 06/23/2014 0357   K 3.8 06/23/2014 0357   CL 105 06/23/2014 0357   CO2 23 06/23/2014 0357   GLUCOSE 248* 06/23/2014 0357   BUN 7 06/23/2014 0357   CREATININE 0.53 06/23/2014 0357   CALCIUM 8.0* 06/23/2014 0357   GFRNONAA >60 06/23/2014 0357   GFRAA >60 06/23/2014 0357   Lipase  No results found for: LIPASE     Studies/Results: Ct Pelvis W Contrast  06/21/2014   CLINICAL DATA:  Left buttock pain with localized swelling  EXAM: CT PELVIS WITH CONTRAST  TECHNIQUE: Multidetector CT imaging of the pelvis was performed using the standard protocol following the bolus administration of intravenous contrast.  CONTRAST:  100mL OMNIPAQUE IOHEXOL 300 MG/ML  SOLN  COMPARISON:  None.  FINDINGS: The bladder, uterus and ovaries are within normal limits  with the exception of a right ovarian cyst which measures 3.2 cm in greatest dimension. No free pelvic fluid is noted. The osseous structures are within normal limits.  In the posterior medial left gluteal region there is subcutaneous inflammatory change as well as a considerable amount of air throughout the subcutaneous fat medially. This extends inferiorly towards the anus but does not appear to approximate the anus. A small 3.2 cm subcutaneous abscess is noted along the medial aspect of the left buttock.  IMPRESSION: Changes of cellulitis/fasciitis in the medial left buttock with apparent extending inferiorly towards the anus. A small air-fluid collection is noted in the medial aspect of the left buttock which measures 3.2 by 1.8 cm consistent with a small subcutaneous abscess.   Electronically Signed   By: Alcide CleverMark  Lukens M.D.   On: 06/21/2014 16:49    Anti-infectives: Anti-infectives    Start     Dose/Rate Route Frequency Ordered Stop   06/22/14 0030  piperacillin-tazobactam (ZOSYN) IVPB 3.375 g     3.375 g 12.5 mL/hr over 240 Minutes Intravenous 3 times per day 06/21/14 2225     06/21/14 1600  vancomycin (VANCOCIN) IVPB 1000 mg/200 mL premix     1,000 mg 200 mL/hr over 60 Minutes Intravenous  Once 06/21/14 1546 06/21/14 1928   06/21/14 1600  piperacillin-tazobactam (ZOSYN) IVPB 3.375 g  3.375 g 100 mL/hr over 30 Minutes Intravenous  Once 06/21/14 1546 06/21/14 1711       Assessment/Plan  POD 1, s/p Incision and drainage of perirectal abscess -- Dr. Magnus Ivan -will start BID dressing changes today.   -cont IV abx therapy -arrange [facility] -maybe home tomorrow depending on how well she is doing with her dressing changes.  -Cx pending  LOS: 1 day    Mindel Friscia E 06/23/2014, 8:59 AM Pager: [phone]

## 2014-06-23 NOTE — Progress Notes (Signed)
Inpatient Diabetes Program Recommendations  AACE/ADA: New Consensus Statement on Inpatient Glycemic Control (2013)  Target Ranges:  Prepandial:   less than 140 mg/dL      Peak postprandial:   less than 180 mg/dL (1-2 hours)      Critically ill patients:  140 - 180 mg/dL   Review of Glycemic Control: Results for [name], [name] (MRN [id number]) as of 06/23/2014 12:51  Ref. Range 06/22/2014 13:07 06/22/2014 17:22 06/22/2014 22:16 06/23/2014 08:03 06/23/2014 12:05  Glucose-Capillary Latest Ref Range: 65-99 mg/dL 782295 (H) 956236 (H) 213232 (H) 230 (H) 262 (H)    Note CBG's continue to be greater than goal.  Consider increasing Lantus to 55 units q HS.  Also, consider increasing Novolog meal coverage to 10 units tid with meals.  Thanks, Beryl MeagerJenny Tiya Schrupp, RN, BC-ADM Inpatient Diabetes Coordinator Pager [phone] (8a-5p)

## 2014-06-24 LAB — GLUCOSE, CAPILLARY: Glucose-Capillary: 182 mg/dL — ABNORMAL HIGH (ref 65–99)

## 2014-06-24 LAB — CULTURE, ROUTINE-ABSCESS

## 2014-06-24 MED ORDER — DOXYCYCLINE HYCLATE 50 MG PO CAPS: 100.0000 mg | ORAL_CAPSULE | Freq: Two times a day (BID) | ORAL | Status: DC

## 2014-06-24 MED ORDER — OXYCODONE-ACETAMINOPHEN 5-325 MG PO TABS: 1.0000 | ORAL_TABLET | ORAL | Status: DC | PRN

## 2014-06-24 MED ORDER — PROMETHAZINE HCL 12.5 MG PO TABS: 12.5000 mg | ORAL_TABLET | Freq: Four times a day (QID) | ORAL | Status: DC | PRN

## 2014-06-24 NOTE — Discharge Instructions (Signed)
Dressing Change °A dressing is a material placed over wounds. It keeps the wound clean, dry, and protected from further injury. This provides an environment that favors wound healing.  °BEFORE YOU BEGIN °· Get your supplies together. Things you may need include: °¨ Saline solution. °¨ Flexible gauze dressing. °¨ Medicated cream. °¨ Tape. °¨ Gloves. °¨ Abdominal dressing pads. °¨ Gauze squares. °¨ Plastic bags. °· Take pain medicine 30 minutes before the dressing change if you need it. °· Take a shower before you do the first dressing change of the day. Use plastic wrap or a plastic bag to prevent the dressing from getting wet. °REMOVING YOUR OLD DRESSING  °· Wash your hands with soap and water. Dry your hands with a clean towel. °· Put on your gloves. °· Remove any tape. °· Carefully remove the old dressing. If the dressing sticks, you may dampen it with warm water to loosen it, or follow your caregiver's specific directions. °· Remove any gauze or packing tape that is in your wound. °· Take off your gloves. °· Put the gloves, tape, gauze, or any packing tape into a plastic bag. °CHANGING YOUR DRESSING °· Open the supplies. °· Take the cap off the saline solution. °· Open the gauze package so that the gauze remains on the inside of the package. °· Put on your gloves. °· Clean your wound as told by your caregiver. °· If you have been told to keep your wound dry, follow those instructions. °· Your caregiver may tell you to do one or more of the following: °¨ Pick up the gauze. Pour the saline solution over the gauze. Squeeze out the extra saline solution. °¨ Put medicated cream or other medicine on your wound if you have been told to do so. °¨ Put the solution soaked gauze only in your wound, not on the skin around it. °¨ Pack your wound loosely or as told by your caregiver. °¨ Put dry gauze on your wound. °¨ Put abdominal dressing pads over the dry gauze if your wet gauze soaks through. °· Tape the abdominal dressing  pads in place so they will not fall off. Do not wrap the tape completely around the affected part (arm, leg, abdomen). °· Wrap the dressing pads with a flexible gauze dressing to secure it in place. °· Take off your gloves. Put them in the plastic bag with the old dressing. Tie the bag shut and throw it away. °· Keep the dressing clean and dry until your next dressing change. °· Wash your hands. °SEEK MEDICAL CARE IF: °· Your skin around the wound looks red. °· Your wound feels more tender or sore. °· You see pus in the wound. °· Your wound smells bad. °· You have a fever. °· Your skin around the wound has a rash that itches and burns. °· You see black or yellow skin in your wound that was not there before. °· You feel nauseous, throw up, and feel very tired. °Document Released: 02/22/2004 Document Revised: 04/08/2011 Document Reviewed: 11/26/2010 °ExitCare® Patient Information ©2015 [employer]. This information is not intended to replace advice given to you by your health care provider. Make sure you discuss any questions you have with your health care provider. ° °

## 2014-06-24 NOTE — Care Management Note (Signed)
Case Management Note  Patient Details  Name: [name] MRN: [id number] Date of Birth: 08/16/1970  Subjective/Objective:                    Action/Plan:  MATCH letter given and explained . Expected Discharge Date:                  Expected Discharge Plan:  Home w Home Health Services  In-House Referral:     Discharge planning Services  CM Consult, MATCH Program, Medication Assistance  Post Acute Care Choice:  Home Health Choice offered to:     DME Arranged:    DME Agency:     HH Arranged:  RN HH Agency:  [facility]  Status of Service:     Medicare Important Message Given:    Date Medicare IM Given:    Medicare IM give by:    Date Additional Medicare IM Given:    Additional Medicare Important Message give by:     If discussed at Long Length of Stay Meetings, dates discussed:    Additional Comments:  Kingsley PlanWile, Waqas Bruhl Marie, RN 06/24/2014, 10:50 AM

## 2014-06-24 NOTE — Discharge Summary (Signed)
Patient ID: Caitlin MustardCrystal Caridi MRN: [id number] DOB/AGE: 43/09/1970 43 y.o.  Admit date: 06/21/2014 Discharge date: 06/24/2014  Procedures: incision and drainage of left buttock/perirectal abscess by Dr. Magnus IvanBlackman  Consults: None  Reason for Admission: She is transferred down from [facility] for a left buttock/perianal abscess. CT findings were worrisome for possible cellulitis/fasciitis. She reports having discomfort for several days which acutely worsened today. She has fever. She has no previous history of similar abscesses or infections. She denies shortness of breath. She complains of peripheral neuropathy which is chronic. She's been moving her bowels normally.  Admission Diagnoses:  1. Perirectal abscess 2. DM  Hospital Course: The patient was admitted and placed on Zosyn.  She was taken to the OR where she underwent the above procedure.  She tolerated it well.  Since it was so early in the morning, her dressing was not changed on POD 0.  Her dressing changes were started the following day.  Her cultures revealed Group B Strep.  Her WBC normalized and she was tolerating her dressing changes with oral pain medications.  She was stable on POD 2, for dc home.  PE: Skin: buttock wound is clean, no erythema or induration noted.  Discharge Diagnoses:  Active Problems:   Perirectal abscess s/p I&D  Discharge Medications:   Medication List    TAKE these medications        atenolol 50 MG tablet  Commonly known as:  TENORMIN  Take 50 mg by mouth daily.     doxycycline 50 MG capsule  Commonly known as:  VIBRAMYCIN  Take 2 capsules (100 mg total) by mouth 2 (two) times daily.     gabapentin 300 MG capsule  Commonly known as:  NEURONTIN  Take 600 mg by mouth at bedtime.     HUMALOG 100 UNIT/ML injection  Generic drug:  insulin lispro  Inject 10 Units into the skin 3 (three) times daily before meals.     LANTUS 100 UNIT/ML injection  Generic drug:  insulin glargine  Inject  45-50 Units into the skin at bedtime. 45 units in the evening but may increase to 50 units if fasting sugar >200     levothyroxine 50 MCG tablet  Commonly known as:  SYNTHROID, LEVOTHROID  Take 50 mcg by mouth daily.     omeprazole 40 MG capsule  Commonly known as:  PRILOSEC  Take 40 mg by mouth daily.     oxyCODONE-acetaminophen 5-325 MG per tablet  Commonly known as:  PERCOCET/ROXICET  Take 1-2 tablets by mouth every 4 (four) hours as needed for moderate pain or severe pain.     promethazine 12.5 MG tablet  Commonly known as:  PHENERGAN  Take 1-2 tablets (12.5-25 mg total) by mouth every 6 (six) hours as needed for nausea or vomiting.     sitaGLIPtin-metformin 50-1000 MG per tablet  Commonly known as:  JANUMET  Take 1 tablet by mouth 2 (two) times daily with a meal.        Discharge Instructions:     Follow-up Information    Follow up with [facility] On 07/12/2014.   Specialty:  General Surgery   Why:  [facility] the Week [facility], 3:00pm, arrive no later than 2:30pm for paperwork   Contact information:   [address] [place] [address] [phone]       Signed: Letha CapeOSBORNE,Lawrnce Reyez E 06/24/2014, 8:53 AM

## 2014-06-26 LAB — ANAEROBIC CULTURE

## 2020-03-28 ENCOUNTER — Emergency Department (HOSPITAL_COMMUNITY): Payer: 59

## 2020-03-28 ENCOUNTER — Other Ambulatory Visit: Payer: Self-pay

## 2020-03-28 ENCOUNTER — Encounter (HOSPITAL_COMMUNITY): Payer: Self-pay | Admitting: Emergency Medicine

## 2020-03-28 ENCOUNTER — Inpatient Hospital Stay (HOSPITAL_COMMUNITY)
Admission: EM | Disposition: A | Discharge status: Home / Self Care | Payer: Self-pay | Source: Home / Self Care | Attending: Internal Medicine

## 2020-03-28 ENCOUNTER — Inpatient Hospital Stay (HOSPITAL_COMMUNITY)
Admission: EM | Admit: 2020-03-28 | Discharge: 2020-04-03 | DRG: 246 | Disposition: A | Discharge status: Home / Self Care | Payer: 59 | Attending: Internal Medicine | Admitting: Internal Medicine

## 2020-03-28 ENCOUNTER — Observation Stay (HOSPITAL_BASED_OUTPATIENT_CLINIC_OR_DEPARTMENT_OTHER): Payer: 59

## 2020-03-28 DIAGNOSIS — Z20822 Contact with and (suspected) exposure to covid-19: Secondary | ICD-10-CM | POA: Diagnosis present

## 2020-03-28 DIAGNOSIS — I2510 Atherosclerotic heart disease of native coronary artery without angina pectoris: Secondary | ICD-10-CM | POA: Diagnosis present

## 2020-03-28 DIAGNOSIS — E1169 Type 2 diabetes mellitus with other specified complication: Secondary | ICD-10-CM | POA: Diagnosis present

## 2020-03-28 DIAGNOSIS — E1165 Type 2 diabetes mellitus with hyperglycemia: Secondary | ICD-10-CM | POA: Diagnosis present

## 2020-03-28 DIAGNOSIS — R079 Chest pain, unspecified: Secondary | ICD-10-CM | POA: Diagnosis not present

## 2020-03-28 DIAGNOSIS — I509 Heart failure, unspecified: Secondary | ICD-10-CM

## 2020-03-28 DIAGNOSIS — I10 Essential (primary) hypertension: Secondary | ICD-10-CM | POA: Diagnosis present

## 2020-03-28 DIAGNOSIS — I214 Non-ST elevation (NSTEMI) myocardial infarction: Principal | ICD-10-CM | POA: Diagnosis present

## 2020-03-28 DIAGNOSIS — R0602 Shortness of breath: Secondary | ICD-10-CM | POA: Diagnosis not present

## 2020-03-28 DIAGNOSIS — J9611 Chronic respiratory failure with hypoxia: Secondary | ICD-10-CM

## 2020-03-28 DIAGNOSIS — Z7982 Long term (current) use of aspirin: Secondary | ICD-10-CM

## 2020-03-28 DIAGNOSIS — R0600 Dyspnea, unspecified: Secondary | ICD-10-CM

## 2020-03-28 DIAGNOSIS — G4733 Obstructive sleep apnea (adult) (pediatric): Secondary | ICD-10-CM | POA: Diagnosis present

## 2020-03-28 DIAGNOSIS — I110 Hypertensive heart disease with heart failure: Secondary | ICD-10-CM | POA: Diagnosis present

## 2020-03-28 DIAGNOSIS — R0609 Other forms of dyspnea: Secondary | ICD-10-CM

## 2020-03-28 DIAGNOSIS — F32A Depression, unspecified: Secondary | ICD-10-CM | POA: Diagnosis present

## 2020-03-28 DIAGNOSIS — I5033 Acute on chronic diastolic (congestive) heart failure: Secondary | ICD-10-CM | POA: Diagnosis not present

## 2020-03-28 DIAGNOSIS — Z955 Presence of coronary angioplasty implant and graft: Secondary | ICD-10-CM

## 2020-03-28 DIAGNOSIS — Z79899 Other long term (current) drug therapy: Secondary | ICD-10-CM

## 2020-03-28 DIAGNOSIS — K219 Gastro-esophageal reflux disease without esophagitis: Secondary | ICD-10-CM | POA: Diagnosis present

## 2020-03-28 DIAGNOSIS — I5041 Acute combined systolic (congestive) and diastolic (congestive) heart failure: Secondary | ICD-10-CM | POA: Diagnosis present

## 2020-03-28 DIAGNOSIS — K5900 Constipation, unspecified: Secondary | ICD-10-CM | POA: Diagnosis present

## 2020-03-28 DIAGNOSIS — Z794 Long term (current) use of insulin: Secondary | ICD-10-CM

## 2020-03-28 DIAGNOSIS — Z888 Allergy status to other drugs, medicaments and biological substances status: Secondary | ICD-10-CM

## 2020-03-28 DIAGNOSIS — Z6841 Body Mass Index (BMI) 40.0 and over, adult: Secondary | ICD-10-CM

## 2020-03-28 DIAGNOSIS — E782 Mixed hyperlipidemia: Secondary | ICD-10-CM | POA: Diagnosis present

## 2020-03-28 DIAGNOSIS — J9601 Acute respiratory failure with hypoxia: Secondary | ICD-10-CM | POA: Diagnosis present

## 2020-03-28 DIAGNOSIS — E039 Hypothyroidism, unspecified: Secondary | ICD-10-CM | POA: Diagnosis present

## 2020-03-28 DIAGNOSIS — I959 Hypotension, unspecified: Secondary | ICD-10-CM | POA: Diagnosis not present

## 2020-03-28 DIAGNOSIS — F329 Major depressive disorder, single episode, unspecified: Secondary | ICD-10-CM | POA: Diagnosis present

## 2020-03-28 DIAGNOSIS — Z91040 Latex allergy status: Secondary | ICD-10-CM

## 2020-03-28 HISTORY — PX: CORONARY STENT INTERVENTION: CATH118234

## 2020-03-28 HISTORY — PX: LEFT HEART CATH AND CORONARY ANGIOGRAPHY: CATH118249

## 2020-03-28 LAB — ECHOCARDIOGRAM COMPLETE
AR max vel: 1.84 cm2
AV Area VTI: 1.94 cm2
AV Area mean vel: 2.03 cm2
AV Mean grad: 4 mmHg
AV Peak grad: 7.6 mmHg
Ao pk vel: 1.38 m/s
Area-P 1/2: 7.66 cm2
Height: 66 in
S' Lateral: 3.7 cm
Weight: 5680 oz

## 2020-03-28 LAB — CBG MONITORING, ED
Glucose-Capillary: 251 mg/dL — ABNORMAL HIGH (ref 70–99)
Glucose-Capillary: 217 mg/dL — ABNORMAL HIGH (ref 70–99)

## 2020-03-28 LAB — HEMOGLOBIN A1C
Hgb A1c MFr Bld: 7.7 % — ABNORMAL HIGH (ref 4.8–5.6)
Mean Plasma Glucose: 174.29 mg/dL

## 2020-03-28 LAB — TROPONIN I (HIGH SENSITIVITY)
Troponin I (High Sensitivity): 514 ng/L (ref <18)
Troponin I (High Sensitivity): 539 ng/L (ref <18)
Troponin I (High Sensitivity): 1302 ng/L (ref <18)
Troponin I (High Sensitivity): 1643 ng/L (ref <18)

## 2020-03-28 LAB — LIPID PANEL
Cholesterol: 106 mg/dL (ref 0–200)
HDL: 27 mg/dL — ABNORMAL LOW (ref >40)
LDL Cholesterol: 57 mg/dL (ref 0–99)
Total CHOL/HDL Ratio: 3.9 RATIO
Triglycerides: 110 mg/dL (ref <150)
VLDL: 22 mg/dL (ref 0–40)

## 2020-03-28 LAB — CBC
HCT: 40.1 % (ref 36.0–46.0)
HCT: 37.5 % (ref 36.0–46.0)
Hemoglobin: 11.5 g/dL — ABNORMAL LOW (ref 12.0–15.0)
Hemoglobin: 11.1 g/dL — ABNORMAL LOW (ref 12.0–15.0)
MCH: 23.8 pg — ABNORMAL LOW (ref 26.0–34.0)
MCH: 24 pg — ABNORMAL LOW (ref 26.0–34.0)
MCHC: 28.7 g/dL — ABNORMAL LOW (ref 30.0–36.0)
MCHC: 29.6 g/dL — ABNORMAL LOW (ref 30.0–36.0)
MCV: 82.9 fL (ref 80.0–100.0)
MCV: 81.2 fL (ref 80.0–100.0)
Platelets: 251 10*3/uL (ref 150–400)
Platelets: 234 10*3/uL (ref 150–400)
RBC: 4.84 MIL/uL (ref 3.87–5.11)
RBC: 4.62 MIL/uL (ref 3.87–5.11)
RDW: 17.2 % — ABNORMAL HIGH (ref 11.5–15.5)
RDW: 17.3 % — ABNORMAL HIGH (ref 11.5–15.5)
WBC: 10 10*3/uL (ref 4.0–10.5)
WBC: 9.4 10*3/uL (ref 4.0–10.5)
nRBC: 0 % (ref 0.0–0.2)
nRBC: 0 % (ref 0.0–0.2)

## 2020-03-28 LAB — BASIC METABOLIC PANEL
Anion gap: 11 (ref 5–15)
BUN: 19 mg/dL (ref 6–20)
CO2: 29 mmol/L (ref 22–32)
Calcium: 8.6 mg/dL — ABNORMAL LOW (ref 8.9–10.3)
Chloride: 99 mmol/L (ref 98–111)
Creatinine, Ser: 1.17 mg/dL — ABNORMAL HIGH (ref 0.44–1.00)
GFR, Estimated: 58 mL/min — ABNORMAL LOW (ref >60)
Glucose, Bld: 279 mg/dL — ABNORMAL HIGH (ref 70–99)
Potassium: 4.4 mmol/L (ref 3.5–5.1)
Sodium: 139 mmol/L (ref 135–145)

## 2020-03-28 LAB — BRAIN NATRIURETIC PEPTIDE: B Natriuretic Peptide: 92.3 pg/mL (ref 0.0–100.0)

## 2020-03-28 LAB — RESP PANEL BY RT-PCR (FLU A&B, COVID) ARPGX2
Influenza A by PCR: NEGATIVE
Influenza B by PCR: NEGATIVE
SARS Coronavirus 2 by RT PCR: NEGATIVE

## 2020-03-28 LAB — TSH: TSH: 5.423 u[IU]/mL — ABNORMAL HIGH (ref 0.350–4.500)

## 2020-03-28 LAB — I-STAT BETA HCG BLOOD, ED (MC, WL, AP ONLY): I-stat hCG, quantitative: <5 m[IU]/mL (ref <5)

## 2020-03-28 LAB — POCT ACTIVATED CLOTTING TIME: Activated Clotting Time: 321 seconds

## 2020-03-28 LAB — HEPARIN LEVEL (UNFRACTIONATED): Heparin Unfractionated: <0.1 IU/mL — ABNORMAL LOW (ref 0.30–0.70)

## 2020-03-28 LAB — GLUCOSE, CAPILLARY: Glucose-Capillary: 248 mg/dL — ABNORMAL HIGH (ref 70–99)

## 2020-03-28 LAB — CREATININE, SERUM
Creatinine, Ser: 1.13 mg/dL — ABNORMAL HIGH (ref 0.44–1.00)
GFR, Estimated: >60 mL/min (ref >60)

## 2020-03-28 LAB — HIV ANTIBODY (ROUTINE TESTING W REFLEX): HIV Screen 4th Generation wRfx: NONREACTIVE

## 2020-03-28 LAB — T4, FREE: Free T4: 1.02 ng/dL (ref 0.61–1.12)

## 2020-03-28 SURGERY — LEFT HEART CATH AND CORONARY ANGIOGRAPHY
Anesthesia: LOCAL

## 2020-03-28 MED ORDER — INSULIN ASPART 100 UNIT/ML ~~LOC~~ SOLN
0.0000 [IU] | Freq: Three times a day (TID) | SUBCUTANEOUS | Status: DC
  Administered 2020-03-29: 15 [IU] via SUBCUTANEOUS
  Administered 2020-03-29: 7 [IU] via SUBCUTANEOUS
  Administered 2020-03-29: 11 [IU] via SUBCUTANEOUS
  Administered 2020-03-30: 7 [IU] via SUBCUTANEOUS
  Administered 2020-03-30: 4 [IU] via SUBCUTANEOUS
  Administered 2020-03-31: 2 [IU] via SUBCUTANEOUS
  Administered 2020-03-31: 4 [IU] via SUBCUTANEOUS
  Administered 2020-03-31: 11 [IU] via SUBCUTANEOUS
  Administered 2020-04-01 (×2): 7 [IU] via SUBCUTANEOUS
  Administered 2020-04-02: 11 [IU] via SUBCUTANEOUS
  Administered 2020-04-02: 4 [IU] via SUBCUTANEOUS
  Administered 2020-04-03: 3 [IU] via SUBCUTANEOUS

## 2020-03-28 MED ORDER — ACETAMINOPHEN 325 MG PO TABS: 650.0000 mg | ORAL_TABLET | ORAL | Status: DC | PRN

## 2020-03-28 MED ORDER — HYDRALAZINE HCL 20 MG/ML IJ SOLN: 10.0000 mg | INTRAMUSCULAR | Status: AC | PRN

## 2020-03-28 MED ORDER — ONDANSETRON HCL 4 MG/2ML IJ SOLN: 4.0000 mg | Freq: Four times a day (QID) | INTRAMUSCULAR | Status: DC | PRN

## 2020-03-28 MED ORDER — SODIUM CHLORIDE 0.9 % IV SOLN: INTRAVENOUS | Status: DC

## 2020-03-28 MED ORDER — HEPARIN BOLUS VIA INFUSION
3000.0000 [IU] | Freq: Once | INTRAVENOUS | Status: DC
  Filled 2020-03-28: qty 3000

## 2020-03-28 MED ORDER — PANTOPRAZOLE SODIUM 40 MG PO TBEC
40.0000 mg | DELAYED_RELEASE_TABLET | Freq: Every day | ORAL | Status: DC
  Administered 2020-03-28 – 2020-04-03 (×7): 40 mg via ORAL
  Filled 2020-03-28 (×7): qty 1

## 2020-03-28 MED ORDER — ONDANSETRON HCL 4 MG/2ML IJ SOLN
4.0000 mg | Freq: Four times a day (QID) | INTRAMUSCULAR | Status: DC | PRN
  Administered 2020-03-30 – 2020-04-02 (×3): 4 mg via INTRAVENOUS
  Filled 2020-03-28 (×4): qty 2

## 2020-03-28 MED ORDER — FUROSEMIDE 10 MG/ML IJ SOLN
40.0000 mg | Freq: Two times a day (BID) | INTRAMUSCULAR | Status: DC
  Administered 2020-03-28 – 2020-03-29 (×3): 40 mg via INTRAVENOUS
  Filled 2020-03-28 (×3): qty 4

## 2020-03-28 MED ORDER — INSULIN ASPART 100 UNIT/ML ~~LOC~~ SOLN: 0.0000 [IU] | Freq: Three times a day (TID) | SUBCUTANEOUS | Status: DC

## 2020-03-28 MED ORDER — TICAGRELOR 90 MG PO TABS
ORAL_TABLET | ORAL | Status: DC | PRN
  Administered 2020-03-28: 180 mg via ORAL

## 2020-03-28 MED ORDER — LIDOCAINE HCL (PF) 1 % IJ SOLN
INTRAMUSCULAR | Status: DC | PRN
  Administered 2020-03-28: 2 mL

## 2020-03-28 MED ORDER — LEVOTHYROXINE SODIUM 50 MCG PO TABS
50.0000 ug | ORAL_TABLET | Freq: Every day | ORAL | Status: DC
  Administered 2020-03-28 – 2020-04-03 (×7): 50 ug via ORAL
  Filled 2020-03-28 (×7): qty 1

## 2020-03-28 MED ORDER — ACETAMINOPHEN 325 MG PO TABS
650.0000 mg | ORAL_TABLET | ORAL | Status: DC | PRN
  Administered 2020-03-29 – 2020-04-02 (×8): 650 mg via ORAL
  Filled 2020-03-28 (×9): qty 2

## 2020-03-28 MED ORDER — LOSARTAN POTASSIUM 50 MG PO TABS
50.0000 mg | ORAL_TABLET | Freq: Every day | ORAL | Status: DC
  Administered 2020-03-29 – 2020-03-30 (×2): 50 mg via ORAL
  Filled 2020-03-28 (×2): qty 1

## 2020-03-28 MED ORDER — TIROFIBAN (AGGRASTAT) BOLUS VIA INFUSION
INTRAVENOUS | Status: DC | PRN
Start: 2020-03-28 — End: 2020-03-28
  Administered 2020-03-28: 4310 ug via INTRAVENOUS

## 2020-03-28 MED ORDER — FENTANYL CITRATE (PF) 100 MCG/2ML IJ SOLN
INTRAMUSCULAR | Status: AC
  Filled 2020-03-28: qty 2

## 2020-03-28 MED ORDER — NITROGLYCERIN 0.4 MG SL SUBL: 0.4000 mg | SUBLINGUAL_TABLET | SUBLINGUAL | Status: DC | PRN

## 2020-03-28 MED ORDER — TIROFIBAN HCL IN NACL 5-0.9 MG/100ML-% IV SOLN
INTRAVENOUS | Status: AC
  Filled 2020-03-28: qty 100

## 2020-03-28 MED ORDER — MIDAZOLAM HCL 2 MG/2ML IJ SOLN
INTRAMUSCULAR | Status: DC | PRN
  Administered 2020-03-28: 1 mg via INTRAVENOUS

## 2020-03-28 MED ORDER — MIDAZOLAM HCL 2 MG/2ML IJ SOLN
INTRAMUSCULAR | Status: AC
  Filled 2020-03-28: qty 2

## 2020-03-28 MED ORDER — NITROGLYCERIN 1 MG/10 ML FOR IR/CATH LAB
INTRA_ARTERIAL | Status: DC | PRN
  Administered 2020-03-28 (×2): 200 ug via INTRACORONARY

## 2020-03-28 MED ORDER — VERAPAMIL HCL 2.5 MG/ML IV SOLN
INTRAVENOUS | Status: AC
  Filled 2020-03-28: qty 2

## 2020-03-28 MED ORDER — SODIUM CHLORIDE 0.9 % IV SOLN
INTRAVENOUS | Status: DC | PRN
  Administered 2020-03-28: 10 mL/h via INTRAVENOUS

## 2020-03-28 MED ORDER — TICAGRELOR 90 MG PO TABS
90.0000 mg | ORAL_TABLET | Freq: Two times a day (BID) | ORAL | Status: DC
  Administered 2020-03-29 – 2020-04-03 (×11): 90 mg via ORAL
  Filled 2020-03-28 (×11): qty 1

## 2020-03-28 MED ORDER — ATORVASTATIN CALCIUM 10 MG PO TABS
20.0000 mg | ORAL_TABLET | Freq: Every day | ORAL | Status: DC
  Administered 2020-03-28: 20 mg via ORAL
  Filled 2020-03-28: qty 2

## 2020-03-28 MED ORDER — DULOXETINE HCL 60 MG PO CPEP: 60.0000 mg | ORAL_CAPSULE | Freq: Every day | ORAL | Status: DC

## 2020-03-28 MED ORDER — INSULIN ASPART 100 UNIT/ML ~~LOC~~ SOLN
0.0000 [IU] | SUBCUTANEOUS | Status: DC
  Administered 2020-03-28: 7 [IU] via SUBCUTANEOUS
  Administered 2020-03-28: 11 [IU] via SUBCUTANEOUS

## 2020-03-28 MED ORDER — TICAGRELOR 90 MG PO TABS
ORAL_TABLET | ORAL | Status: AC
  Filled 2020-03-28: qty 2

## 2020-03-28 MED ORDER — HEPARIN SODIUM (PORCINE) 5000 UNIT/ML IJ SOLN
5000.0000 [IU] | Freq: Three times a day (TID) | INTRAMUSCULAR | Status: DC
  Administered 2020-03-28 – 2020-04-03 (×17): 5000 [IU] via SUBCUTANEOUS
  Filled 2020-03-28 (×17): qty 1

## 2020-03-28 MED ORDER — HEPARIN BOLUS VIA INFUSION
3000.0000 [IU] | Freq: Once | INTRAVENOUS | Status: AC
  Administered 2020-03-28: 3000 [IU] via INTRAVENOUS
  Filled 2020-03-28: qty 3000

## 2020-03-28 MED ORDER — FUROSEMIDE 10 MG/ML IJ SOLN
40.0000 mg | Freq: Once | INTRAMUSCULAR | Status: AC
  Administered 2020-03-28: 40 mg via INTRAVENOUS
  Filled 2020-03-28: qty 4

## 2020-03-28 MED ORDER — HEPARIN BOLUS VIA INFUSION
4000.0000 [IU] | Freq: Once | INTRAVENOUS | Status: AC
  Administered 2020-03-28: 4000 [IU] via INTRAVENOUS
  Filled 2020-03-28: qty 4000

## 2020-03-28 MED ORDER — SODIUM CHLORIDE 0.9% FLUSH
3.0000 mL | Freq: Two times a day (BID) | INTRAVENOUS | Status: DC
  Administered 2020-03-29 – 2020-04-02 (×10): 3 mL via INTRAVENOUS

## 2020-03-28 MED ORDER — OXYBUTYNIN CHLORIDE ER 10 MG PO TB24
10.0000 mg | ORAL_TABLET | Freq: Every day | ORAL | Status: DC
  Filled 2020-03-28: qty 1

## 2020-03-28 MED ORDER — INSULIN ASPART 100 UNIT/ML ~~LOC~~ SOLN
0.0000 [IU] | Freq: Every day | SUBCUTANEOUS | Status: DC
  Administered 2020-03-29: 4 [IU] via SUBCUTANEOUS
  Administered 2020-03-29: 2 [IU] via SUBCUTANEOUS

## 2020-03-28 MED ORDER — HEPARIN (PORCINE) IN NACL 1000-0.9 UT/500ML-% IV SOLN
INTRAVENOUS | Status: AC
  Filled 2020-03-28: qty 1000

## 2020-03-28 MED ORDER — ASPIRIN 81 MG PO CHEW
81.0000 mg | CHEWABLE_TABLET | Freq: Every day | ORAL | Status: DC
  Administered 2020-03-29 – 2020-04-03 (×6): 81 mg via ORAL
  Filled 2020-03-28 (×6): qty 1

## 2020-03-28 MED ORDER — FENTANYL CITRATE (PF) 100 MCG/2ML IJ SOLN
INTRAMUSCULAR | Status: DC | PRN
  Administered 2020-03-28: 25 ug via INTRAVENOUS

## 2020-03-28 MED ORDER — HEPARIN SODIUM (PORCINE) 1000 UNIT/ML IJ SOLN
INTRAMUSCULAR | Status: DC | PRN
  Administered 2020-03-28: 5000 [IU] via INTRAVENOUS
  Administered 2020-03-28: 12000 [IU] via INTRAVENOUS

## 2020-03-28 MED ORDER — LIDOCAINE HCL (PF) 1 % IJ SOLN
INTRAMUSCULAR | Status: AC
  Filled 2020-03-28: qty 30

## 2020-03-28 MED ORDER — HEPARIN SODIUM (PORCINE) 1000 UNIT/ML IJ SOLN
INTRAMUSCULAR | Status: AC
  Filled 2020-03-28: qty 1

## 2020-03-28 MED ORDER — ASPIRIN EC 81 MG PO TBEC: 81.0000 mg | DELAYED_RELEASE_TABLET | Freq: Every day | ORAL | Status: DC

## 2020-03-28 MED ORDER — SODIUM CHLORIDE 0.9% FLUSH: 3.0000 mL | INTRAVENOUS | Status: DC | PRN

## 2020-03-28 MED ORDER — METOPROLOL TARTRATE 25 MG PO TABS
25.0000 mg | ORAL_TABLET | Freq: Two times a day (BID) | ORAL | Status: DC
  Administered 2020-03-28 – 2020-04-03 (×13): 25 mg via ORAL
  Filled 2020-03-28 (×14): qty 1

## 2020-03-28 MED ORDER — INSULIN ASPART PROT & ASPART (70-30 MIX) 100 UNIT/ML ~~LOC~~ SUSP: 100.0000 [IU] | Freq: Two times a day (BID) | SUBCUTANEOUS | Status: DC

## 2020-03-28 MED ORDER — PERFLUTREN LIPID MICROSPHERE
1.0000 mL | INTRAVENOUS | Status: AC | PRN
  Administered 2020-03-28: 2 mL via INTRAVENOUS
  Filled 2020-03-28: qty 10

## 2020-03-28 MED ORDER — ATORVASTATIN CALCIUM 80 MG PO TABS
80.0000 mg | ORAL_TABLET | Freq: Every day | ORAL | Status: DC
  Administered 2020-03-29 – 2020-03-31 (×3): 80 mg via ORAL
  Filled 2020-03-28 (×3): qty 1

## 2020-03-28 MED ORDER — INFLUENZA VAC SPLIT QUAD 0.5 ML IM SUSY: 0.5000 mL | PREFILLED_SYRINGE | INTRAMUSCULAR | Status: DC

## 2020-03-28 MED ORDER — ASPIRIN 81 MG PO CHEW
324.0000 mg | CHEWABLE_TABLET | Freq: Once | ORAL | Status: AC
  Administered 2020-03-28: 324 mg via ORAL
  Filled 2020-03-28: qty 4

## 2020-03-28 MED ORDER — SODIUM CHLORIDE 0.9 % IV SOLN: 250.0000 mL | INTRAVENOUS | Status: DC | PRN

## 2020-03-28 MED ORDER — LABETALOL HCL 5 MG/ML IV SOLN: 10.0000 mg | INTRAVENOUS | Status: AC | PRN

## 2020-03-28 MED ORDER — HEPARIN (PORCINE) IN NACL 1000-0.9 UT/500ML-% IV SOLN
INTRAVENOUS | Status: DC | PRN
  Administered 2020-03-28 (×2): 500 mL

## 2020-03-28 MED ORDER — NITROGLYCERIN IN D5W 200-5 MCG/ML-% IV SOLN
0.0000 ug/min | INTRAVENOUS | Status: DC
  Administered 2020-03-28: 5 ug/min via INTRAVENOUS
  Administered 2020-03-29: 38.333 ug/min via INTRAVENOUS
  Filled 2020-03-28 (×2): qty 250

## 2020-03-28 MED ORDER — SODIUM CHLORIDE 0.9% FLUSH
3.0000 mL | INTRAVENOUS | Status: DC | PRN
  Administered 2020-03-31: 3 mL via INTRAVENOUS
  Administered 2020-04-02: 6 mL via INTRAVENOUS

## 2020-03-28 MED ORDER — SODIUM CHLORIDE 0.9% FLUSH: 3.0000 mL | Freq: Two times a day (BID) | INTRAVENOUS | Status: DC

## 2020-03-28 MED ORDER — POTASSIUM CHLORIDE CRYS ER 20 MEQ PO TBCR
20.0000 meq | EXTENDED_RELEASE_TABLET | Freq: Once | ORAL | Status: AC
  Administered 2020-03-28: 20 meq via ORAL
  Filled 2020-03-28: qty 1

## 2020-03-28 MED ORDER — INSULIN GLARGINE 100 UNIT/ML ~~LOC~~ SOLN
25.0000 [IU] | Freq: Every day | SUBCUTANEOUS | Status: DC
  Administered 2020-03-28 – 2020-03-29 (×2): 25 [IU] via SUBCUTANEOUS
  Filled 2020-03-28 (×2): qty 0.25

## 2020-03-28 MED ORDER — HEPARIN (PORCINE) 25000 UT/250ML-% IV SOLN
1700.0000 [IU]/h | INTRAVENOUS | Status: DC
  Administered 2020-03-28: 1400 [IU]/h via INTRAVENOUS
  Filled 2020-03-28: qty 250

## 2020-03-28 MED ORDER — NITROGLYCERIN 1 MG/10 ML FOR IR/CATH LAB
INTRA_ARTERIAL | Status: AC
  Filled 2020-03-28: qty 10

## 2020-03-28 MED ORDER — IOHEXOL 350 MG/ML SOLN
INTRAVENOUS | Status: DC | PRN
  Administered 2020-03-28: 140 mL via INTRA_ARTERIAL

## 2020-03-28 MED ORDER — VERAPAMIL HCL 2.5 MG/ML IV SOLN
INTRAVENOUS | Status: DC | PRN
  Administered 2020-03-28: 10 mL via INTRA_ARTERIAL

## 2020-03-28 SURGICAL SUPPLY — 20 items
BALLN SAPPHIRE 2.5X15 (BALLOONS) ×4
BALLN SAPPHIRE ~~LOC~~ 3.75X12 (BALLOONS) ×2 IMPLANT
BALLOON SAPPHIRE 2.5X15 (BALLOONS) ×2 IMPLANT
CATH 5FR JL3.5 JR4 ANG PIG MP (CATHETERS) ×2 IMPLANT
CATH LAUNCHER 6FR EBU3.5 (CATHETERS) ×2 IMPLANT
DEVICE RAD COMP TR BAND LRG (VASCULAR PRODUCTS) ×2 IMPLANT
GLIDESHEATH SLEND SS 6F .021 (SHEATH) ×2 IMPLANT
GUIDEWIRE INQWIRE 1.5J.035X260 (WIRE) ×1 IMPLANT
INQWIRE 1.5J .035X260CM (WIRE) ×2
KIT ENCORE 26 ADVANTAGE (KITS) ×2 IMPLANT
KIT HEART LEFT (KITS) ×2 IMPLANT
KIT HEMO VALVE WATCHDOG (MISCELLANEOUS) ×2 IMPLANT
MAT PREVALON FULL STRYKER (MISCELLANEOUS) ×2 IMPLANT
PACK CARDIAC CATHETERIZATION (CUSTOM PROCEDURE TRAY) ×2 IMPLANT
SHEATH PROBE COVER 6X72 (BAG) ×2 IMPLANT
STENT RESOLUTE ONYX 2.75X22 (Permanent Stent) ×2 IMPLANT
TRANSDUCER W/STOPCOCK (MISCELLANEOUS) ×2 IMPLANT
TUBING CIL FLEX 10 FLL-RA (TUBING) ×2 IMPLANT
WIRE ASAHI PROWATER 180CM (WIRE) ×2 IMPLANT
WIRE HI TORQ BMW 190CM (WIRE) ×2 IMPLANT

## 2020-03-28 NOTE — ED Notes (Signed)
Pt placed on 2L [place] - O2 levels fluctuating, pt states she feels SOB and would benefit from O2

## 2020-03-28 NOTE — Progress Notes (Addendum)
Progress Note  Patient Name: [name] Date of Encounter: 03/28/2020  Primary Cardiologist: Lenward Chancellor, MD  Subjective   Breathing a little better, edema started about a year ago or more.  She is feeling a little better since getting IV Lasix, but not back to baseline  Inpatient Medications    Scheduled Meds: . [START ON 03/29/2020] aspirin EC  81 mg Oral Daily  . atorvastatin  20 mg Oral Daily  . furosemide  40 mg Intravenous BID  . insulin aspart  0-20 Units Subcutaneous Q4H  . insulin glargine  25 Units Subcutaneous Daily  . levothyroxine  50 mcg Oral Q0600  . losartan  50 mg Oral Daily  . metoprolol tartrate  25 mg Oral BID  . pantoprazole  40 mg Oral Daily   Continuous Infusions: . heparin 1,400 Units/hr (03/28/20 0616)  . nitroGLYCERIN 16.667 mcg/min (03/28/20 0604)   PRN Meds: acetaminophen, ondansetron (ZOFRAN) IV   Vital Signs    Vitals:   03/28/20 0730 03/28/20 0745 03/28/20 0800 03/28/20 0815  BP: 119/70 110/68 114/61 117/77  Pulse: 83 84 81 84  Resp: (!) 29 (!) 28  18  Temp:      TempSrc:      SpO2: 98% 95% 95% 98%  Weight:      Height:       No intake or output data in the 24 hours ending 03/28/20 0834 Filed Weights   03/28/20 0402  Weight: (!) 161 kg   Last Weight  Most recent update: 03/28/2020  4:03 AM   Weight  161 kg (355 lb)             Weight change:    Telemetry    SR, ST - Personally Reviewed  ECG    SR, HR 88, LAFB  - Personally Reviewed  Physical Exam   General: Well developed, morbidly obese, female appearing in no acute distress. Head: Normocephalic, atraumatic.  Neck: Supple without bruits, JVD not seen. Lungs:  Resp regular and unlabored, rales noted. Heart: RRR, S1, S2, no S3, S4, or murmur; no rub. Abdomen: Obese, Soft, non-tender, non-distended with normoactive bowel sounds. No hepatomegaly. No rebound/guarding. No obvious abdominal masses. Extremities: No clubbing, cyanosis, 2+ LE edema. Distal  radial pulses are 2+ bilaterally. Neuro: Alert and oriented X 3. Moves all extremities spontaneously. Psych: Normal affect.  Labs    Hematology Recent Labs  Lab 03/28/20 0347  WBC 10.0  RBC 4.84  HGB 11.5*  HCT 40.1  MCV 82.9  MCH 23.8*  MCHC 28.7*  RDW 17.2*  PLT 251    Chemistry Recent Labs  Lab 03/28/20 0347  NA 139  K 4.4  CL 99  CO2 29  GLUCOSE 279*  BUN 19  CREATININE 1.17*  CALCIUM 8.6*  GFRNONAA 58*  ANIONGAP 11     High Sensitivity Troponin:   Recent Labs  Lab 03/28/20 0347 03/28/20 0606  TROPONINIHS 514* 539*      BNP Recent Labs  Lab 03/28/20 0506  BNP 92.3    No results found for: HGBA1C No results found for: TSH No results found for: CHOL, HDL, LDLCALC, LDLDIRECT, TRIG, CHOLHDL   DDimer No results for input(s): DDIMER in the last 168 hours.   Radiology    DG Chest 2 View  Result Date: 03/28/2020 CLINICAL DATA:  Shortness of breath EXAM: CHEST - 2 VIEW COMPARISON:  None available FINDINGS: Cardiomegaly and central vascular congestion. There is no edema, consolidation, effusion, or pneumothorax. IMPRESSION:  Cardiomegaly and vascular congestion. Electronically Signed   By: Marnee Spring M.D.   On: 03/28/2020 04:54     Cardiac Studies   ECHO:  ordered  Patient Profile     50 y.o. female w/ hx morbid obesity, uncontrolled DM, GERD, hypothyroid, depression, HTN, dyslipidemia, OSA not on CPAP, was admitted 02/28 with CHF (new dx) and elevated troponin.  Assessment & Plan    1. CHF - Sx started more than a year ago (c/o LE edema 11/2018 UC visit) - good UOP after Lasix 40 mg IV, continue this bid as ordered - f/u on echo - monitor daily wts, I/O, daily BMET - if resp difficult to assess, may need RHC once volume improved  2. NSTEMI - no hx CP - SOB not suddenly worse, has been coming on a long time so may not be anginal equivalent - f/u on echo - although trop elevated, it is not that high and no marked upward trend - if EF  nl and no WMA, discuss plan w/ MD - if EF abnl or +WMA>> needs cath  3. OSA - Says she was told she had it, but has never been tested - multiple episodes of apnea overnight - encouraged her to get sleep study as outpt, we can arrange  Otherwise, per IM Principal Problem:   NSTEMI (non-ST elevated myocardial infarction) (HCC) Active Problems:   Acute on chronic congestive heart failure (HCC)   Essential hypertension   GERD without esophagitis   Uncontrolled type 2 diabetes mellitus with hyperglycemia, with long-term current use of insulin (HCC)   Mixed hyperlipidemia due to type 2 diabetes mellitus (HCC)   Hypothyroidism   Major depressive disorder    Melida Quitter , PA-C 8:34 AM 03/28/2020 Pager: [phone]

## 2020-03-28 NOTE — Progress Notes (Signed)
Called to pt room for chest tightness, SOB worse than normal and headache. Paged on call Hospitalist and Cardiologist about this; Gave scheduled Lasix, SSI, Nitro gtt still infusing at 23ml/hr, adjusted Heparin gtt and bolus per Pharmacy. Cardiology came to bedside to discuss options; MD decided at this time heart cath would be appropriate. Prep completed and consent signed. Transported by two cath lab RN.   1800: Notified that pt would not be returning to the unit and would be transferred to 6E. Pt boyfriend still in room and updated about transfer; Belongings packed and boyfriend taken to [facility] waiting room.

## 2020-03-28 NOTE — Progress Notes (Signed)
Evaluated at RN's request for breathlessness post PCI of LAD.  Breathlessness, abruptly starting after ticagrelor.  Feels like she can't catch her breath with shallow breathing.  Counseled that this may resolve after subsequent doses and would rather not immediately switch immediately post-PCI.  LVEDP high at [facility], will give adjunctive lasix.  Repeat ECG to exclude IST/anginal equivalent.  Luanna Salk, MD Cardiology Fellow [facility]

## 2020-03-28 NOTE — ED Notes (Signed)
Horton, MD notified of critical troponin.

## 2020-03-28 NOTE — ED Notes (Signed)
Pt appears significantly short of breath upon minimal exertion, O2 saturations maintaining in the high 90% on RA. Pt reports increasing swelling in her legs over the last 5 months, now swelling in her stomach. Lower extremities taught with pitting edema noted. Pt reports that she does take a diuretic, but does not believe it is helping her anymore. Does not have CHF diagnosis at this time.

## 2020-03-28 NOTE — ED Triage Notes (Signed)
Pt reports being very SOB for "a while".  Pt states she has been retaining fluid in her legs but now feels "like I'm drowning.... And I can't lay down at all."  Pt reports substernal chest pressure that moves to her back.

## 2020-03-28 NOTE — Interval H&P Note (Signed)
Cath Lab Visit (complete for each Cath Lab visit)  Clinical Evaluation Leading to the Procedure:   ACS: Yes.    Non-ACS:    Anginal Classification: CCS IV  Anti-ischemic medical therapy: Minimal Therapy (1 class of medications)  Non-Invasive Test Results: No non-invasive testing performed  Prior CABG: No previous CABG      History and Physical Interval Note:  03/28/2020 5:27 PM  [name]  has presented today for surgery, with the diagnosis of nstemi - chest pain.  The various methods of treatment have been discussed with the patient and family. After consideration of risks, benefits and other options for treatment, the patient has consented to  Procedure(s): LEFT HEART CATH AND CORONARY ANGIOGRAPHY (N/A) as a surgical intervention.  The patient's history has been reviewed, patient examined, no change in status, stable for surgery.  I have reviewed the patient's chart and labs.  Questions were answered to the patient's satisfaction.     Lance Muss

## 2020-03-28 NOTE — ED Notes (Signed)
US at bedside

## 2020-03-28 NOTE — Progress Notes (Signed)
ANTICOAGULATION CONSULT NOTE - Follow Up Consult  Pharmacy Consult for IV Heparin  Indication: chest pain/ACS  Allergies  Allergen Reactions  . Clindamycin/Lincomycin Other (See Comments) and Nausea And Vomiting    Stomach pain   . Latex Rash  . Ultram [Tramadol] Rash    Patient Measurements: Height: 5\' 6"  (167.6 cm) Weight: (!) 172.4 kg (380 lb) (scale b) IBW/kg (Calculated) : 59.3  Heparin Dosing Weight: 103.6 kg  Vital Signs: Temp: 98.8 F (37.1 C) (03/01 1411) Temp Source: Oral (03/01 1411) BP: 129/73 (03/01 1410) Pulse Rate: 77 (03/01 1410)  Labs: Recent Labs    03/28/20 0347 03/28/20 0606 03/28/20 1433  HGB 11.5*  --   --   HCT 40.1  --   --   PLT 251  --   --   HEPARINUNFRC  --   --  <0.10*  CREATININE 1.17*  --   --   TROPONINIHS 514* 539*  --     Estimated Creatinine Clearance: 97 mL/min (A) (by C-G formula based on SCr of 1.17 mg/dL (H)).   Medical History: Past Medical History:  Diagnosis Date  . DDD (degenerative disc disease), lumbar   . Diabetes mellitus without complication (HCC)   . Hypertension   . Lumbar herniated disc    Assessment: 50 yr old female with chest pain and shortness of breath, elevated troponin, starting IV heparin. Pt was no on anticoagulant PTA.  S/P Echo today, LVEF 50-55%  Initial heparin level ~8 hrs after heparin 4000 units IV bolus X 1, followed by heparin infusion at 1400 units/hr was <0.10 units/ml (undetectable). H/H 11.5/40.1, plt 251. Per RN, no issues with IV or bleeding observed.  Goal of Therapy:  Heparin level 0.3-0.7 units/ml Monitor platelets by anticoagulation protocol: Yes   Plan:  Heparin 3000 units IV bolus X 1 Increase heparin infusion to 1700 units/hr Check heparin level in 6 hrs Monitor daily heparin level, CBC Monitor for bleeding  04-18-1994, PharmD, BCPS, [facility] Clinical Pharmacist

## 2020-03-28 NOTE — Progress Notes (Signed)
ANTICOAGULATION CONSULT NOTE - Initial Consult  Pharmacy Consult for Heparin  Indication: chest pain/ACS  Allergies  Allergen Reactions  . Latex Rash  . Ultram [Tramadol] Rash    Patient Measurements: Height: 5\' 6"  (167.6 cm) Weight: (!) 161 kg (355 lb) IBW/kg (Calculated) : 59.3  Vital Signs: Temp: 98.8 F (37.1 C) (03/01 0430) Temp Source: Oral (03/01 0430) BP: 142/68 (03/01 0500) Pulse Rate: 80 (03/01 0500)  Labs: Recent Labs    03/28/20 0347  HGB 11.5*  HCT 40.1  PLT 251  CREATININE 1.17*  TROPONINIHS 514*    Estimated Creatinine Clearance: 92.8 mL/min (A) (by C-G formula based on SCr of 1.17 mg/dL (H)).   Medical History: Past Medical History:  Diagnosis Date  . DDD (degenerative disc disease), lumbar   . Diabetes mellitus without complication (HCC)   . Hypertension   . Lumbar herniated disc      Assessment: 50 y/o F with chest pain and shortness of breath, found to have elevated troponin, starting heparin, Hgb 11.5, renal function ok, PTA meds reviewed.   Goal of Therapy:  Heparin level 0.3-0.7 units/ml Monitor platelets by anticoagulation protocol: Yes   Plan:  Heparin 4000 units BOLUS Start heparin drip at 1400 units/hr 1400 Heparin level Daily CBC/Heparin level Monitor for bleeding  Abran Duke, PharmD, BCPS Clinical Pharmacist Phone: [phone]

## 2020-03-28 NOTE — Progress Notes (Addendum)
  Echocardiogram 2D Echocardiogram has been performed with contrast.  [name] 03/28/2020, 9:08 AM

## 2020-03-28 NOTE — ED Notes (Signed)
Changed pt purewick and performed full linen change

## 2020-03-28 NOTE — ED Notes (Signed)
Needs met. NAD present.

## 2020-03-28 NOTE — Progress Notes (Addendum)
Heart Failure Nurse Navigator Progress Note  PCP: Patient, No Pcp Per [facility] per chart review last seen 12/21 for DM management PCP-Cardiologist: [facility] (new) Admission Diagnosis: NSTEMI Admitted from: home with sig other  Presentation:   [name] presented with difficulty breathing and chest pain.  ECHO/ LVEF: 50-55%  Clinical Course:  Past Medical History:  Diagnosis Date   DDD (degenerative disc disease), lumbar    Diabetes mellitus without complication (HCC)    Hypertension    Lumbar herniated disc    Social History   Socioeconomic History   Marital status: Single    Spouse name: Not on file   Number of children: Not on file   Years of education: Not on file   Highest education level: Not on file  Occupational History   Not on file  Tobacco Use   Smoking status: Never Smoker   Smokeless tobacco: Never Used  Vaping Use   Vaping Use: Never used  Substance and Sexual Activity   Alcohol use: No   Drug use: No   Sexual activity: Not on file  Other Topics Concern   Not on file  Social History Narrative   Not on file   Social Determinants of Health   Financial Resource Strain: Medium Risk   Difficulty of Paying Living Expenses: Somewhat hard  Food Insecurity: No Food Insecurity   Worried About Running Out of Food in the Last Year: Never true   Ran Out of Food in the Last Year: Never true  Transportation Needs: No Transportation Needs   Lack of Transportation (Medical): No   Lack of Transportation (Non-Medical): No  Physical Activity: Inactive   Days of Exercise per Week: 0 days   Minutes of Exercise per Session: 0 min  Stress: Not on file  Social Connections: Not on file   High Risk Criteria for Readmission and/or Poor Patient Outcomes:  Heart failure hospital admissions (last 6 months): 1  No Show rate: N/A  Difficult social situation: yes  Demonstrates medication adherence: no  Primary Language:  English  Literacy level: able to read/write and comprehend  Barriers of Care:   -financial issues -medication cost -education  Considerations/Referrals:   Referral made to Heart Failure Pharmacist Stewardship: yes, appreciated/will see at [facility] TOC visit. Referral made to Heart & Vascular TOC clinic: yes, will follow closer to DC. Pt still getting IV lasix currently.  Items for Follow-up on DC/TOC: -medication optimization -med cost -financial: pt has hard time affording new rent and meds -increase ADL/self care as able.   Ozella Rocks, RN, BSN Heart Failure Nurse Navigator [phone]

## 2020-03-28 NOTE — H&P (Signed)
History and Physical    [name] RSW:[id number] DOB: 10/21/1970 DOA: 03/28/2020  PCP: Patient, No Pcp Per  Patient coming from: Home   Chief Complaint:  Chief Complaint  Patient presents with  . Chest Pain  . Shortness of Breath     HPI:    50 year old female with past medical history of insulin-dependent diabetes mellitus type 2, hypertension, hypothyroidism, morbid obesity, gastroesophageal reflux disease, major depressive disorder, hyperlipidemia who presents to [facility] emergency department with complaints of shortness of breath and chest discomfort.  Patient explains that for the past several months she has been experiencing gradually worsening bilateral lower extremity with swelling, weight gain and a dyspnea on exertion.  Patient reports that in the past 2 weeks, her symptoms have accelerated, associated with severe shortness of breath.  Patient describes shortness of breath as severe in intensity, worse with minimal exertion and improved with rest.  Patient complains of associated pillow orthopnea and bouts of nocturnal dyspnea over the span of time.  States that her leg swelling is continued to progressively worsen as well.  Over the past 2 weeks the patient has also began to develop associated chest discomfort.  Patient describes chest discomfort as nearly constant but waxing and waning in quality.  Chest discomfort is moderate to severe in intensity, worse with exertion and improved with rest.  Chest discomfort is midsternal in location and nonradiating.  Due to patient's progressively worsening shortness of breath and chest discomfort patient presented to [facility] emergency department for evaluation  Upon evaluation in the emergency department patient was clinically found to be in respiratory distress and complaining of active chest discomfort.  While BNP was found to be 92.3, clinically patient was felt to be in acute congestive heart failure.   Furthermore, troponin was found to be 514 concerning for NSTEMI.  Patient was administered a nitroglycerin infusion and heparin infusion.  Case was discussed with Dr. Kandice Moos who promptly came to evaluate the patient at the bedside.   The hospitalist group was then called to assess the patient for admission the hospital.  Review of Systems:   Review of Systems  Respiratory: Positive for cough.   Cardiovascular: Positive for chest pain, orthopnea, leg swelling and PND.  All other systems reviewed and are negative.   Past Medical History:  Diagnosis Date  . DDD (degenerative disc disease), lumbar   . Diabetes mellitus without complication (HCC)   . Hypertension   . Lumbar herniated disc     Past Surgical History:  Procedure Laterality Date  . DILATION AND CURETTAGE OF UTERUS    . INCISION AND DRAINAGE PERIRECTAL ABSCESS  06/21/2014  . INCISION AND DRAINAGE PERIRECTAL ABSCESS Bilateral 06/21/2014   Procedure: IRRIGATION AND DEBRIDEMENT PERIRECTAL ABSCESS;  Surgeon: Abigail Miyamoto, MD;  Location: [facility] OR;  Service: General;  Laterality: Bilateral;     reports that she has never smoked. She has never used smokeless tobacco. She reports that she does not drink alcohol and does not use drugs.  Allergies  Allergen Reactions  . Latex Rash  . Ultram [Tramadol] Rash    Family History  Problem Relation Age of Onset  . Heart disease Neg Hx      Prior to Admission medications   Medication Sig Start Date End Date Taking? Authorizing Provider  atenolol (TENORMIN) 50 MG tablet Take 50 mg by mouth daily. 04/17/14   [provider]  doxycycline (VIBRAMYCIN) 50 MG capsule Take 2 capsules (100 mg total) by mouth 2 (  two) times daily. 06/24/14   Barnetta Chapelsborne, Kelly, PA-C  gabapentin (NEURONTIN) 300 MG capsule Take 600 mg by mouth at bedtime. 04/25/14   [provider]  HUMALOG 100 UNIT/ML injection Inject 10 Units into the skin 3 (three) times daily before meals. 04/25/14   [provider]  LANTUS 100 UNIT/ML injection Inject 45-50 Units into the skin at bedtime. 45 units in the evening but may increase to 50 units if fasting sugar >200 04/23/14   [provider]  levothyroxine (SYNTHROID, LEVOTHROID) 50 MCG tablet Take 50 mcg by mouth daily. 04/15/14   [provider]  omeprazole (PRILOSEC) 40 MG capsule Take 40 mg by mouth daily. 04/14/14   [provider]  oxyCODONE-acetaminophen (PERCOCET/ROXICET) 5-325 MG per tablet Take 1-2 tablets by mouth every 4 (four) hours as needed for moderate pain or severe pain. 06/24/14   Barnetta Chapelsborne, Kelly, PA-C  promethazine (PHENERGAN) 12.5 MG tablet Take 1-2 tablets (12.5-25 mg total) by mouth every 6 (six) hours as needed for nausea or vomiting. 06/24/14   Barnetta Chapelsborne, Kelly, PA-C  sitaGLIPtin-metformin (JANUMET) 50-1000 MG per tablet Take 1 tablet by mouth 2 (two) times daily with a meal.    [provider]    Physical Exam: Vitals:   03/28/20 0500 03/28/20 0535 03/28/20 0630 03/28/20 0645  BP: (!) 142/68  122/76 127/73  Pulse: 80 87 84 90  Resp: (!) 22 10 (!) 23 (!) 25  Temp:      TempSrc:      SpO2: (!) 86% 96% 100% 95%  Weight:      Height:        Constitutional: Acute alert and oriented x3, patient is in respiratory distress.  Patient is obese.   Skin: no rashes, no lesions, good skin turgor noted. Eyes: Pupils are equally reactive to light.  No evidence of scleral icterus or conjunctival pallor.  ENMT: Moist mucous membranes noted.  Posterior pharynx clear of any exudate or lesions.  Neck: normal, supple, no masses, no thyromegaly.  No evidence of jugular venous distension.   Respiratory: Scattered rhonchi bilaterally with bibasilar rales.  Minimal expiratory wheezing noted.  Increased respiratory effort noted. Cardiovascular: Regular rate and rhythm, no murmurs / rubs / gallops.  Extensive bilateral lower extremity pitting edema that tracks up to the lower abdominal wall.  2+ pedal pulses. No  carotid bruits.  Chest:   Nontender without crepitus or deformity.   Back:   Nontender without crepitus or deformity. Abdomen: Abdomen is protuberant but soft and nontender.  No evidence of intra-abdominal masses.  Positive bowel sounds noted in all quadrants.   Musculoskeletal: No joint deformity upper and lower extremities. Good ROM, no contractures. Normal muscle tone.  Neurologic: CN 2-12 grossly intact. Sensation intact.  Patient moving all 4 extremities spontaneously.  Patient is following all commands.  Patient is responsive to verbal stimuli.   Psychiatric: Patient exhibits anxious mood with labile affect.  Patient seems to possess insight as to their current situation.     Labs on Admission: I have personally reviewed following labs and imaging studies -   CBC: Recent Labs  Lab 03/28/20 0347  WBC 10.0  HGB 11.5*  HCT 40.1  MCV 82.9  PLT 251   Basic Metabolic Panel: Recent Labs  Lab 03/28/20 0347  NA 139  K 4.4  CL 99  CO2 29  GLUCOSE 279*  BUN 19  CREATININE 1.17*  CALCIUM 8.6*   GFR: Estimated Creatinine Clearance: 92.8 mL/min (A) (by C-G formula  based on SCr of 1.17 mg/dL (H)). Liver Function Tests: No results for input(s): AST, ALT, ALKPHOS, BILITOT, PROT, ALBUMIN in the last 168 hours. No results for input(s): LIPASE, AMYLASE in the last 168 hours. No results for input(s): AMMONIA in the last 168 hours. Coagulation Profile: No results for input(s): INR, PROTIME in the last 168 hours. Cardiac Enzymes: No results for input(s): CKTOTAL, CKMB, CKMBINDEX, TROPONINI in the last 168 hours. BNP (last 3 results) No results for input(s): PROBNP in the last 8760 hours. HbA1C: No results for input(s): HGBA1C in the last 72 hours. CBG: No results for input(s): GLUCAP in the last 168 hours. Lipid Profile: No results for input(s): CHOL, HDL, LDLCALC, TRIG, CHOLHDL, LDLDIRECT in the last 72 hours. Thyroid Function Tests: No results for input(s): TSH, T4TOTAL, FREET4,  T3FREE, THYROIDAB in the last 72 hours. Anemia Panel: No results for input(s): VITAMINB12, FOLATE, FERRITIN, TIBC, IRON, RETICCTPCT in the last 72 hours. Urine analysis: No results found for: COLORURINE, APPEARANCEUR, LABSPEC, PHURINE, GLUCOSEU, HGBUR, BILIRUBINUR, KETONESUR, PROTEINUR, UROBILINOGEN, NITRITE, LEUKOCYTESUR  Radiological Exams on Admission - Personally Reviewed: DG Chest 2 View  Result Date: 03/28/2020 CLINICAL DATA:  Shortness of breath EXAM: CHEST - 2 VIEW COMPARISON:  None available FINDINGS: Cardiomegaly and central vascular congestion. There is no edema, consolidation, effusion, or pneumothorax. IMPRESSION: Cardiomegaly and vascular congestion. Electronically Signed   By: Marnee Spring M.D.   On: 03/28/2020 04:54    EKG: Personally reviewed.  Rhythm is normal sinus rhythm with heart rate of 88 bpm.  No dynamic ST segment changes appreciated.  Assessment/Plan Principal Problem:   NSTEMI (non-ST elevated myocardial infarction) [facility])   Patient presenting with 2-week history of progressively worsening exertional chest discomfort  Troponin found to be 514 concerning for NSTEMI  Treating patient with intravenous heparin  Treating patient with intravenous nitroglycerin which is being titrated to achieve resolution of chest discomfort  Continuing home regimen of daily statin therapy  Initiating metoprolol 25 mg twice daily  Cycling cardiac enzymes  Monitoring patient on telemetry  Echocardiogram ordered for the morning  ER provider discussed case with Dr. Gilmore Laroche with cardiology overnight who has graciously evaluated the patient at the bedside, recommendations are appreciated.  Keeping patient n.p.o. for now awaiting further cardiology input today as to if and when patient will undergo cardiac catheterization.  Active Problems:   Acute on chronic congestive heart failure [facility])  Patient presenting with several month history of progressively worsening pedal edema  pillow orthopnea paroxysmal nocturnal dyspnea that has become particularly severe in the past 2 weeks  Clinically, patient appears to be in acute congestive heart failure despite deceptively normal BNP  Placing patient on Lasix 40 mg IV every 12 for now  Strict input and output monitoring  Monitoring Daily weights  Monitoring renal function and electrolytes with serial chemistries  Obtaining echocardiogram in the morning    Essential hypertension   Continuing home regimen of losartan 50 mg daily    Uncontrolled type 2 diabetes mellitus with hyperglycemia, with long-term current use of insulin (HCC)   Patient presenting with marked hyperglycemia suggestive of poorly controlled diabetes mellitus  Since patient is currently n.p.o., will place patient on low-dose Lantus with high intensity sliding scale insulin  Once patient is tolerating a diet again, motioning to 70/30 and titrating to achieve excellent glycemic control while hospitalized may be an effective additive strategy    Mixed hyperlipidemia due to type 2 diabetes mellitus (HCC)   Continuing home regimen of statin therapy  Lipid panel pending    Hypothyroidism  . Resume home regimen of Synthroid . TSH ordered and pending    GERD without esophagitis .  Continuing home regimen of daily PPI therapy.     Code Status:  Full code Family Communication: Deferred  Status is: Observation  The patient remains OBS appropriate and will d/c before 2 midnights.  Dispo: The patient is from: Home              Anticipated d/c is to: Home              Patient currently is not medically stable to d/c.   Difficult to place patient No        Marinda Elk MD [facility] Pager [phone]  If 7PM-7AM, please contact night-coverage [email or web address] Use universal [place] password for that web site. If you do not have the password, please call the hospital operator.  03/28/2020, 6:59 AM

## 2020-03-28 NOTE — Plan of Care (Signed)
Patient seen and rounded on today. See full H&P as well.  50 yo female with PMH morbid obesity, IDDM, HTN, hypothyroidism, MDD, GERD who presented with SOB and CP. Her SOB has appeared to get progressively worse with minimal exertion and improves with rest. She also reported orthopnea and PND with an ROS on admission.  Troponins were elevated and EKG showed poor R wave progression but no significant ST/T wave changes.  Cardiology was consulted, patient was placed on heparin drip.   Plan: - leading differentials include ACS (possibly very early given rapidly uptrending trops with CP but no WMA on echo) vs CHF given vascular congestion on CXR and LE edema along with progressive SOB/orthopnea/PND vs OSA/pulm HTN - follow up echo: poor image quality, EF estimated 50-55% but "low normal function"?? No LV or RV WMA. No LVH - continue trending trops to peak; continue heparin drip  - follow up further cardiology rec's; patient remains on NTG drip with intermittent CP and trops are uptrending  - A1c 7.7%, continue SSI and CBGs and Lantus - TSH 5.423, possibly reactive; check FT4, TT3 - LDL 57  Lewie Chamber, MD [facility] 03/28/2020, 4:35 PM

## 2020-03-28 NOTE — ED Provider Notes (Signed)
[facility]   CSN: [id number] Arrival date & time: 03/28/20  [id number]     History Chief Complaint  Patient presents with  . Chest Pain  . Shortness of Breath    [name] is a 50 y.o. female.  HPI     This a 50 year old female with a history of morbid obesity, diabetes, hypertension who presents with shortness of breath, lower extremity edema and chest pressure.  When asked when her symptoms started, she states "I have had them for a long time."  However, she states that she has had worsening swelling that is now "in my abdomen."  She does take a diuretic twice daily but has not had any recent adjustments and states that she is not peeing much.  She states now that she feels like she is "drowning."  She reports significant dyspnea on exertion and orthopnea.  She also describes chest pressure.  She rates her pressure at 7 out of 10.  It is nonradiating.  She states she has had the pressure for several days but significantly worsened with exertion.  No known history of heart disease but has multiple risk factors.  I have reviewed the patient's chart.  [facility]  I do not see any recent echocardiograms or prior cardiology evaluation.  Past Medical History:  Diagnosis Date  . DDD (degenerative disc disease), lumbar   . Diabetes mellitus without complication (HCC)   . Hypertension   . Lumbar herniated disc     Patient Active Problem List   Diagnosis Date Noted  . Perirectal abscess 06/21/2014    Past Surgical History:  Procedure Laterality Date  . DILATION AND CURETTAGE OF UTERUS    . INCISION AND DRAINAGE PERIRECTAL ABSCESS  06/21/2014  . INCISION AND DRAINAGE PERIRECTAL ABSCESS Bilateral 06/21/2014   Procedure: IRRIGATION AND DEBRIDEMENT PERIRECTAL ABSCESS;  Surgeon: Abigail Miyamotoouglas Blackman, MD;  Location: [facility] OR;  Service: General;  Laterality: Bilateral;     OB History   No obstetric history on  file.     No family history on file.  Social History   Tobacco Use  . Smoking status: Never Smoker  . Smokeless tobacco: Never Used  Substance Use Topics  . Alcohol use: No  . Drug use: No    Home Medications Prior to Admission medications   Medication Sig Start Date End Date Taking? Authorizing Provider  atenolol (TENORMIN) 50 MG tablet Take 50 mg by mouth daily. 04/17/14   [provider]  doxycycline (VIBRAMYCIN) 50 MG capsule Take 2 capsules (100 mg total) by mouth 2 (two) times daily. 06/24/14   Barnetta Chapelsborne, Kelly, PA-C  gabapentin (NEURONTIN) 300 MG capsule Take 600 mg by mouth at bedtime. 04/25/14   [provider]  HUMALOG 100 UNIT/ML injection Inject 10 Units into the skin 3 (three) times daily before meals. 04/25/14   [provider]  LANTUS 100 UNIT/ML injection Inject 45-50 Units into the skin at bedtime. 45 units in the evening but may increase to 50 units if fasting sugar >200 04/23/14   [provider]  levothyroxine (SYNTHROID, LEVOTHROID) 50 MCG tablet Take 50 mcg by mouth daily. 04/15/14   [provider]  omeprazole (PRILOSEC) 40 MG capsule Take 40 mg by mouth daily. 04/14/14   [provider]  oxyCODONE-acetaminophen (PERCOCET/ROXICET) 5-325 MG per tablet Take 1-2 tablets by mouth every 4 (four) hours as needed for moderate pain or severe pain. 06/24/14   Barnetta Chapelsborne, Kelly, PA-C  promethazine (PHENERGAN) 12.5 MG tablet Take 1-2 tablets (12.5-25 mg total) by mouth every 6 (six) hours as needed for nausea or vomiting. 06/24/14   Barnetta Chapel, PA-C  sitaGLIPtin-metformin (JANUMET) 50-1000 MG per tablet Take 1 tablet by mouth 2 (two) times daily with a meal.    [provider]    Allergies    Latex and Ultram [tramadol]  Review of Systems   Review of Systems  Constitutional: Negative for fever.  Respiratory: Positive for shortness of breath.   Cardiovascular: Positive for chest pain and leg swelling.   Gastrointestinal: Negative for abdominal pain, nausea and vomiting.  Genitourinary: Negative for dysuria.  All other systems reviewed and are negative.   Physical Exam Updated Vital Signs BP 130/67   Pulse 85   Resp (!) 24   Ht 1.676 m (5\' 6" )   Wt (!) 161 kg   SpO2 98%   BMI 57.30 kg/m   Physical Exam Vitals and nursing note reviewed.  Constitutional:      Appearance: She is well-developed and well-nourished.     Comments: Morbidly obese, chronically ill-appearing  HENT:     Head: Normocephalic and atraumatic.  Eyes:     Pupils: Pupils are equal, round, and reactive to light.  Cardiovascular:     Rate and Rhythm: Normal rate and regular rhythm.     Heart sounds: Normal heart sounds.  Pulmonary:     Effort: Pulmonary effort is normal. No respiratory distress.     Breath sounds: No wheezing.     Comments: Limited secondary to body habitus, mildly tachypneic, distant breath sounds Abdominal:     General: Bowel sounds are normal.     Palpations: Abdomen is soft.  Musculoskeletal:     Cervical back: Neck supple.     Right lower leg: Edema present.     Left lower leg: Edema present.     Comments: 2+ pitting bilateral lower extremity edema to the thighs, some pitting edema noted in the lower abdomen  Skin:    General: Skin is warm and dry.  Neurological:     Mental Status: She is alert and oriented to person, place, and time.  Psychiatric:        Mood and Affect: Mood and affect and mood normal.     ED Results / Procedures / Treatments   Labs (all labs ordered are listed, but only abnormal results are displayed) Labs Reviewed  BASIC METABOLIC PANEL - Abnormal; Notable for the following components:      Result Value   Glucose, Bld 279 (*)    Creatinine, Ser 1.17 (*)    Calcium 8.6 (*)    GFR, Estimated 58 (*)    All other components within normal limits  CBC - Abnormal; Notable for the following components:   Hemoglobin 11.5 (*)    MCH 23.8 (*)    MCHC 28.7  (*)    RDW 17.2 (*)    All other components within normal limits  TROPONIN I (HIGH SENSITIVITY) - Abnormal; Notable for the following components:   Troponin I (High Sensitivity) 514 (*)    All other components within normal limits  RESP PANEL BY RT-PCR (FLU A&B, COVID) ARPGX2  BRAIN NATRIURETIC PEPTIDE  I-STAT BETA HCG BLOOD, ED ([facility], [facility], [facility] ONLY)    EKG EKG Interpretation  Date/Time:  Tuesday March 28 2020 03:41:52 EST Ventricular Rate:  88 PR Interval:  152 QRS Duration: 96 QT Interval:  388 QTC Calculation: 469 R Axis:   -  51 Text Interpretation: Normal sinus rhythm Left anterior fascicular block Minimal voltage criteria for LVH, may be normal variant ( Cornell product ) Possible Anterolateral infarct , age undetermined Abnormal ECG No stemi Reconfirmed by Ross Marcus ([id number]) on 03/28/2020 4:59:23 AM   Radiology DG Chest 2 View  Result Date: 03/28/2020 CLINICAL DATA:  Shortness of breath EXAM: CHEST - 2 VIEW COMPARISON:  None available FINDINGS: Cardiomegaly and central vascular congestion. There is no edema, consolidation, effusion, or pneumothorax. IMPRESSION: Cardiomegaly and vascular congestion. Electronically Signed   By: Marnee Spring M.D.   On: 03/28/2020 04:54    Procedures .Critical Care Performed by: Shon Baton, MD Authorized by: Shon Baton, MD   Critical care provider statement:    Critical care time (minutes):  45   Critical care was necessary to treat or prevent imminent or life-threatening deterioration of the following conditions:  Cardiac failure   Critical care was time spent personally by me on the following activities:  Discussions with consultants, evaluation of patient's response to treatment, examination of patient, ordering and performing treatments and interventions, ordering and review of laboratory studies, ordering and review of radiographic studies, pulse oximetry, re-evaluation of patient's condition, obtaining history from  patient or surrogate and review of old charts     Medications Ordered in ED Medications  nitroGLYCERIN 50 mg in dextrose 5 % 250 mL (0.2 mg/mL) infusion (has no administration in time range)  furosemide (LASIX) injection 40 mg (has no administration in time range)  aspirin chewable tablet 324 mg (324 mg Oral Given 03/28/20 0459)    ED Course  I have reviewed the triage vital signs and the nursing notes.  Pertinent labs & imaging results that were available during my care of the patient were reviewed by me and considered in my medical decision making (see chart for details).  Clinical Course as of 03/28/20 0507  Tue Mar 28, 2020  0506 Spoke to cardiology, Dr. Deforest Hoyles.  He is reviewed the chart.  He will see the patient place consult note.  Agrees with diuresis.  Request hospitalist admission given patient's morbid obesity and hyperglycemia. [CH]    Clinical Course User Index [CH] Patty Lopezgarcia, Mayer Masker, MD   MDM Rules/Calculators/A&P                          Patient presents with shortness of breath, chest pressure, and lower extremity edema.  She is morbidly obese but nontoxic-appearing.  Mildly tachypneic but O2 sats are 98%.  Denies any recent illnesses.  Clinically speaking, appears to be volume overloaded with pitting lower extremity edema and some anasarca of the abdomen.  She has significant risk factors for ACS.  EKG is nonspecific with some LVH changes but no evidence of ST elevation.  Chest x-ray shows cardiomegaly with vascular congestion.  No overt pulmonary edema or pleural effusions.  Patient's initial troponin V 14.  Unclear whether this is acute or subacute given patient's history.  However, with her risk factors, feel this likely represents a true NSTEMI.  Patient was given aspirin.  She was started on heparin and nitroglycerin.  BNP is pending.  Patient was discussed with cardiology who has reviewed the chart and will place a consult note.  40 mg of IV Lasix was ordered.  At  baseline she takes 20 to 40 mg p.o. daily based on chart review.  We will plan for admission to the hospitalist.  Covid testing is pending. Final Clinical  Impression(s) / ED Diagnoses Final diagnoses:  NSTEMI (non-ST elevated myocardial infarction) (HCC)  Congestive heart failure, unspecified HF chronicity, unspecified heart failure type (HCC)  Dyspnea on exertion    Rx / DC Orders ED Discharge Orders    None       Shon Baton, MD 03/28/20 [phone]

## 2020-03-28 NOTE — Consult Note (Signed)
[facility] Consult Note   Primary Physician: None listed Primary Cardiologist:  None listed  Reason for Consultation: Shortness of breath and chronic leg swelling  HPI:    [name] is a 50 year old female with a past medical history significant for type 2 diabetes mellitus, morbid obesity, hypertension, hypothyroidism and degenerative disc disease, who presents to the hospital with complaints of worsening shortness of breath.  Patient states that she has been noticing dyspnea on minimal exertion that has gotten worse in the past few months. She has also had increasing leg swelling for the past 6 months.  She also complains of nonexertional chest pain that is nonradiating for the past few days.  No diaphoresis, nausea or vomiting.  She has orthopnea and PND. She is unvaccinated. Denies any sick contacts.  She has gained close to 100 lbs in the past 2 years.  In the ED the vital signs were: blood pressure 130/67, heart rate 85 bpm and respiratory rate 24. Labs were as follows: Potassium 4.4, glucose 279, creatinine 1.17, WBC 10, hemoglobin 11.5, hematocrit 40.1 and platelets 251.  Initial high-sensitivity troponin was 514.  Chest x-ray showed some mild cardiomegaly and central vascular congestion.  The ECG from 03/28/2020 (at 0341), that I reviewed personally, poor R progression in the anterior leads and LVH.   Home Medications Prior to Admission medications   Medication Sig Start Date End Date Taking? Authorizing Provider  atenolol (TENORMIN) 50 MG tablet Take 50 mg by mouth daily. 04/17/14   [provider]  doxycycline (VIBRAMYCIN) 50 MG capsule Take 2 capsules (100 mg total) by mouth 2 (two) times daily. 06/24/14   Barnetta Chapel, PA-C  gabapentin (NEURONTIN) 300 MG capsule Take 600 mg by mouth at bedtime. 04/25/14   [provider]  HUMALOG 100 UNIT/ML injection Inject 10 Units into the skin 3 (three) times daily before meals. 04/25/14   [provider]  LANTUS 100 UNIT/ML injection Inject 45-50 Units into the skin at bedtime. 45 units in the evening but may increase to 50 units if fasting sugar >200 04/23/14   [provider]  levothyroxine (SYNTHROID, LEVOTHROID) 50 MCG tablet Take 50 mcg by mouth daily. 04/15/14   [provider]  omeprazole (PRILOSEC) 40 MG capsule Take 40 mg by mouth daily. 04/14/14   [provider]  oxyCODONE-acetaminophen (PERCOCET/ROXICET) 5-325 MG per tablet Take 1-2 tablets by mouth every 4 (four) hours as needed for moderate pain or severe pain. 06/24/14   Barnetta Chapel, PA-C  promethazine (PHENERGAN) 12.5 MG tablet Take 1-2 tablets (12.5-25 mg total) by mouth every 6 (six) hours as needed for nausea or vomiting. 06/24/14   Barnetta Chapel, PA-C  sitaGLIPtin-metformin (JANUMET) 50-1000 MG per tablet Take 1 tablet by mouth 2 (two) times daily with a meal.    [provider]    Past Medical History: Past Medical History:  Diagnosis Date  . DDD (degenerative disc disease), lumbar   . Diabetes mellitus without complication (HCC)   . Hypertension   . Lumbar herniated disc     Past Surgical History: Past Surgical History:  Procedure Laterality Date  . DILATION AND CURETTAGE OF UTERUS    . INCISION AND DRAINAGE PERIRECTAL ABSCESS  06/21/2014  . INCISION AND DRAINAGE PERIRECTAL ABSCESS Bilateral 06/21/2014   Procedure: IRRIGATION AND DEBRIDEMENT PERIRECTAL ABSCESS;  Surgeon: Abigail Miyamoto, MD;  Location: [facility] OR;  Service: General;  Laterality: Bilateral;    Family History: No family history on file.  Social History:  Social History   Socioeconomic History  . Marital status: Single    Spouse name: Not on file  . Number of children: Not on file  . Years of education: Not on file  . Highest education level: Not on file  Occupational History  . Not on file  Tobacco Use  . Smoking status: Never Smoker  . Smokeless tobacco: Never Used  Substance and Sexual Activity  .  Alcohol use: No  . Drug use: No  . Sexual activity: Not on file  Other Topics Concern  . Not on file  Social History Narrative  . Not on file   Social Determinants of Health   Financial Resource Strain: Not on file  Food Insecurity: Not on file  Transportation Needs: Not on file  Physical Activity: Not on file  Stress: Not on file  Social Connections: Not on file    Allergies:  Allergies  Allergen Reactions  . Latex Rash  . Ultram [Tramadol] Rash     Review of Systems: [y] = yes, [ ]  = no   . General: Weight gain [ ] ; Weight loss [ ] ; Anorexia [ ] ; Fatigue [ ] ; Fever [ ] ; Chills [ ] ; Weakness [ ]   . Cardiac: Chest pain/pressure [ ] ; Resting SOB [Y]; Exertional SOB [Y]; Orthopnea [Y]; Pedal Edema [Y]; Palpitations [ ] ; Syncope [ ] ; Presyncope [ ] ; Paroxysmal nocturnal dyspnea[ ]   . Pulmonary: Cough [ ] ; Wheezing[ ] ; Hemoptysis[ ] ; Sputum [ ] ; Snoring [ ]   . GI: Vomiting[ ] ; Dysphagia[ ] ; Melena[ ] ; Hematochezia [ ] ; Heartburn[ ] ; Abdominal pain [ ] ; Constipation [ ] ; Diarrhea [ ] ; BRBPR [ ]   . GU: Hematuria[ ] ; Dysuria [ ] ; Nocturia[ ]   . Vascular: Pain in legs with walking [ ] ; Pain in feet with lying flat [ ] ; Non-healing sores [ ] ; Stroke [ ] ; TIA [ ] ; Slurred speech [ ] ;  . Neuro: Headaches[ ] ; Vertigo[ ] ; Seizures[ ] ; Paresthesias[ ] ;Blurred vision [ ] ; Diplopia [ ] ; Vision changes [ ]   . Ortho/Skin: Arthritis [ ] ; Joint pain [ ] ; Muscle pain [ ] ; Joint swelling [ ] ; Back Pain [ ] ; Rash [ ]   . Psych: Depression[ ] ; Anxiety[ ]   . Heme: Bleeding problems [ ] ; Clotting disorders [ ] ; Anemia [ ]   . Endocrine: Diabetes [ ] ; Thyroid dysfunction[ ]      Objective:    Vital Signs:   Pulse Rate:  [85] 85 (03/01 0415) Resp:  [24] 24 (03/01 0415) BP: (130)/(67) 130/67 (03/01 0415) SpO2:  [98 %] 98 % (03/01 0415) Weight:  [161 kg] 161 kg (03/01 0402)    Weight change: Filed Weights   03/28/20 0402  Weight: (!) 161 kg    Intake/Output:  No intake or output data in the  24 hours ending 03/28/20 0506    Physical Exam    General: Morbidly obese, ill appearing, tachypneic HEENT: normal Neck: elevated JVP . Carotids 2+ bilat; no bruits. No lymphadenopathy or thyromegaly appreciated. Cor: PMI nondisplaced. Regular rate & rhythm. No rubs, gallops or murmurs. Lungs: Distant breath sounds Abdomen: soft, nontender, nondistended. No hepatosplenomegaly. No bruits or masses. Good bowel sounds. Extremities: 2+ pitting edema bilateral lower extremities, no cyanosis, clubbing, rash Neuro: alert & orientedx3, cranial nerves grossly intact. moves all 4 extremities w/o difficulty. Affect pleasant     Labs   Basic Metabolic Panel: Recent Labs  Lab 03/28/20 0347  NA 139  K 4.4  CL 99  CO2 29  GLUCOSE 279*  BUN  19  CREATININE 1.17*  CALCIUM 8.6*    Liver Function Tests: No results for input(s): AST, ALT, ALKPHOS, BILITOT, PROT, ALBUMIN in the last 168 hours. No results for input(s): LIPASE, AMYLASE in the last 168 hours. No results for input(s): AMMONIA in the last 168 hours.  CBC: Recent Labs  Lab 03/28/20 0347  WBC 10.0  HGB 11.5*  HCT 40.1  MCV 82.9  PLT 251    Cardiac Enzymes: No results for input(s): CKTOTAL, CKMB, CKMBINDEX, TROPONINI in the last 168 hours.  BNP: BNP (last 3 results) No results for input(s): BNP in the last 8760 hours.  ProBNP (last 3 results) No results for input(s): PROBNP in the last 8760 hours.   CBG: No results for input(s): GLUCAP in the last 168 hours.  Coagulation Studies: No results for input(s): LABPROT, INR in the last 72 hours.   Imaging   DG Chest 2 View  Result Date: 03/28/2020 CLINICAL DATA:  Shortness of breath EXAM: CHEST - 2 VIEW COMPARISON:  None available FINDINGS: Cardiomegaly and central vascular congestion. There is no edema, consolidation, effusion, or pneumothorax. IMPRESSION: Cardiomegaly and vascular congestion. Electronically Signed   By: Marnee Spring M.D.   On: 03/28/2020 04:54       Medications:     Current Medications:   Infusions: . nitroGLYCERIN         Assessment/Plan   1. Worsening shortness of breath / Elevated troponin Patient presents with complaints of chronic leg swelling and recently worsening dyspnea on exertion.  She also has complaints of resting chest discomfort.  The ECG shows LVH.  Chest x-ray is suggestive of vascular congestion.  The initial high-sensitivity troponin was 514.  -Check a BNP level -Serial ECGs -Continue to trend cardiac enzymes -Aspirin 81 mg daily -Nitroglycerin as needed for chest pain -IV heparin infusion -Check lipid panel -High intensity statin -Daily weights -Strict I and Os -Lasix 40 mg IV x1 -Transthoracic echocardiogram to evaluate LV systolic and diastolic function      Lonie Peak, MD  03/28/2020, 5:06 AM  Cardiology Overnight Team Please contact [facility] for night-coverage after hours (4p -7a ) and weekends on [email or web address]

## 2020-03-28 NOTE — H&P (View-Only) (Signed)
Called to see patient for recurrent chest pain.  She is a 50-year-old female with past medical history of diabetes mellitus for greater than 20 years, hypertension, hyperlipidemia, obstructive sleep apnea, morbid obesity admitted with CHF and chest pain.  She has been diuresed.  She was transferred from the emergency room to the floor and I was called to see for recurrent chest pain.  She describes chest tightness with some increased with inspiration.  On physical exam she appears uncomfortable.  She is morbidly obese.  Her chest is clear to auscultation anteriorly.  Cardiovascular reveals a regular rate and rhythm.  Extremities show trace to 1+ edema. Creatinine 1.17.  Troponin 514, 539 and 1302.  Electrocardiogram shows no acute ST changes.  I have personally reviewed the patient's echocardiogram and she has an apical wall motion abnormality with low normal LV function.  1 non-ST elevation myocardial infarction-patient's troponin has continued to trend up now at 1302.  She is having recurrent chest pain despite medical therapy including aspirin, heparin, nitroglycerin, metoprolol.  Her echocardiogram shows wall motion abnormality.  Her risk factors include greater than 20 years of diabetes mellitus.  Given recurrent pain on medical therapy feel we should proceed with urgent cardiac catheterization.  The risks and benefits including myocardial infarction, CVA and death discussed and she agrees to proceed.  Note we will also check D-dimer.  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  Follow renal function.  3 hypertension-continue present blood pressure medications.  4 hyperlipidemia-continue statin.  5 diabetes mellitus-Per primary care.  CRITICAL CARE Performed by: Brian Crenshaw   Total critical care time: 30-45 minutes  Critical care time was exclusive of separately billable procedures and treating other patients.  Critical care was necessary  to treat or prevent imminent or life-threatening deterioration.  Critical care was time spent personally by me on the following activities: development of treatment plan with patient and/or surrogate as well as nursing, discussions with consultants, evaluation of patient's response to treatment, examination of patient, obtaining history from patient or surrogate, ordering and performing treatments and interventions, ordering and review of laboratory studies, ordering and review of radiographic studies, pulse oximetry and re-evaluation of patient's condition.  Brian Crenshaw, MD  

## 2020-03-28 NOTE — ED Notes (Signed)
Report obtained. Care assumed at this time. Pt resting quietly in bed; pt repositioned. NAD present. No other needs identified.

## 2020-03-28 NOTE — Progress Notes (Signed)
Called to see patient for recurrent chest pain.  She is a 50 year old female with past medical history of diabetes mellitus for greater than 20 years, hypertension, hyperlipidemia, obstructive sleep apnea, morbid obesity admitted with CHF and chest pain.  She has been diuresed.  She was transferred from the emergency room to the floor and I was called to see for recurrent chest pain.  She describes chest tightness with some increased with inspiration.  On physical exam she appears uncomfortable.  She is morbidly obese.  Her chest is clear to auscultation anteriorly.  Cardiovascular reveals a regular rate and rhythm.  Extremities show trace to 1+ edema. Creatinine 1.17.  Troponin 514, 539 and 1302.  Electrocardiogram shows no acute ST changes.  I have personally reviewed the patient's echocardiogram and she has an apical wall motion abnormality with low normal LV function.  1 non-ST elevation myocardial infarction-patient's troponin has continued to trend up now at 1302.  She is having recurrent chest pain despite medical therapy including aspirin, heparin, nitroglycerin, metoprolol.  Her echocardiogram shows wall motion abnormality.  Her risk factors include greater than 20 years of diabetes mellitus.  Given recurrent pain on medical therapy feel we should proceed with urgent cardiac catheterization.  The risks and benefits including myocardial infarction, CVA and death discussed and she agrees to proceed.  Note we will also check D-dimer.  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  Follow renal function.  3 hypertension-continue present blood pressure medications.  4 hyperlipidemia-continue statin.  5 diabetes mellitus-Per primary care.  CRITICAL CARE Performed by: [name]   Total critical care time: 30-45 minutes  Critical care time was exclusive of separately billable procedures and treating other patients.  Critical care was necessary  to treat or prevent imminent or life-threatening deterioration.  Critical care was time spent personally by me on the following activities: development of treatment plan with patient and/or surrogate as well as nursing, discussions with consultants, evaluation of patient's response to treatment, examination of patient, obtaining history from patient or surrogate, ordering and performing treatments and interventions, ordering and review of laboratory studies, ordering and review of radiographic studies, pulse oximetry and re-evaluation of patient's condition.  [name], MD

## 2020-03-28 NOTE — Progress Notes (Signed)
Inpatient Diabetes Program Recommendations  AACE/ADA: New Consensus Statement on Inpatient Glycemic Control (2015)  Target Ranges:  Prepandial:   less than 140 mg/dL      Peak postprandial:   less than 180 mg/dL (1-2 hours)      Critically ill patients:  140 - 180 mg/dL   Lab Results  Component Value Date   GLUCAP 251 (H) 03/28/2020    Review of Glycemic Control NSTEMI/CHF Diabetes history: DM 2 Outpatient Diabetes medications: 70/30 125 units qam, 135 units qpm, metformin 1000 mg bid Current orders for Inpatient glycemic control:  Lantus 25 units Daily Novolog 0-20 units Q4 hours  Inpatient Diabetes Program Recommendations:    Watch on current insulin regimen. Will need to convert to 70/30 after procedures are completed.   Per PCP at visit on 12/29/2019, Dr. Elizabeth Palau, pt has exhausted all methods she knows to control DM. Based on her financial limitations and compliance issues. The ppt reported she understood that failure to treat diabetes aggressively will result in continued long term complications and general feeling of unwell. She is once again resistant to adjusting insulin and intolerant to sulfonylurea, GLP1. Dr. Dareen Piano said she is not a good candidate for SGLT 2 b/o hygiene issues as well as the cost of higher copays. Couldn't afford basal/bolus insulin due to cost as well.  Thanks,  Christena Deem RN, MSN, BC-ADM Inpatient Diabetes Coordinator Team Pager [phone] (8a-5p)

## 2020-03-29 ENCOUNTER — Encounter (HOSPITAL_COMMUNITY): Payer: Self-pay | Admitting: Internal Medicine

## 2020-03-29 DIAGNOSIS — E1165 Type 2 diabetes mellitus with hyperglycemia: Secondary | ICD-10-CM | POA: Diagnosis present

## 2020-03-29 DIAGNOSIS — Z79899 Other long term (current) drug therapy: Secondary | ICD-10-CM | POA: Diagnosis not present

## 2020-03-29 DIAGNOSIS — I25119 Atherosclerotic heart disease of native coronary artery with unspecified angina pectoris: Secondary | ICD-10-CM | POA: Diagnosis not present

## 2020-03-29 DIAGNOSIS — I5033 Acute on chronic diastolic (congestive) heart failure: Secondary | ICD-10-CM | POA: Diagnosis not present

## 2020-03-29 DIAGNOSIS — I5041 Acute combined systolic (congestive) and diastolic (congestive) heart failure: Secondary | ICD-10-CM | POA: Diagnosis present

## 2020-03-29 DIAGNOSIS — J9601 Acute respiratory failure with hypoxia: Secondary | ICD-10-CM | POA: Diagnosis present

## 2020-03-29 DIAGNOSIS — Z794 Long term (current) use of insulin: Secondary | ICD-10-CM | POA: Diagnosis not present

## 2020-03-29 DIAGNOSIS — I10 Essential (primary) hypertension: Secondary | ICD-10-CM | POA: Diagnosis not present

## 2020-03-29 DIAGNOSIS — I214 Non-ST elevation (NSTEMI) myocardial infarction: Secondary | ICD-10-CM | POA: Diagnosis present

## 2020-03-29 DIAGNOSIS — Z6841 Body Mass Index (BMI) 40.0 and over, adult: Secondary | ICD-10-CM | POA: Diagnosis not present

## 2020-03-29 DIAGNOSIS — Z7982 Long term (current) use of aspirin: Secondary | ICD-10-CM | POA: Diagnosis not present

## 2020-03-29 DIAGNOSIS — Z91040 Latex allergy status: Secondary | ICD-10-CM | POA: Diagnosis not present

## 2020-03-29 DIAGNOSIS — I509 Heart failure, unspecified: Secondary | ICD-10-CM | POA: Diagnosis not present

## 2020-03-29 DIAGNOSIS — I110 Hypertensive heart disease with heart failure: Secondary | ICD-10-CM | POA: Diagnosis present

## 2020-03-29 DIAGNOSIS — E1169 Type 2 diabetes mellitus with other specified complication: Secondary | ICD-10-CM | POA: Diagnosis present

## 2020-03-29 DIAGNOSIS — E039 Hypothyroidism, unspecified: Secondary | ICD-10-CM | POA: Diagnosis present

## 2020-03-29 DIAGNOSIS — R0602 Shortness of breath: Secondary | ICD-10-CM | POA: Diagnosis present

## 2020-03-29 DIAGNOSIS — Z20822 Contact with and (suspected) exposure to covid-19: Secondary | ICD-10-CM | POA: Diagnosis present

## 2020-03-29 DIAGNOSIS — F329 Major depressive disorder, single episode, unspecified: Secondary | ICD-10-CM | POA: Diagnosis present

## 2020-03-29 DIAGNOSIS — K219 Gastro-esophageal reflux disease without esophagitis: Secondary | ICD-10-CM | POA: Diagnosis present

## 2020-03-29 DIAGNOSIS — Z888 Allergy status to other drugs, medicaments and biological substances status: Secondary | ICD-10-CM | POA: Diagnosis not present

## 2020-03-29 DIAGNOSIS — K5900 Constipation, unspecified: Secondary | ICD-10-CM | POA: Diagnosis present

## 2020-03-29 DIAGNOSIS — E782 Mixed hyperlipidemia: Secondary | ICD-10-CM | POA: Diagnosis present

## 2020-03-29 DIAGNOSIS — I2510 Atherosclerotic heart disease of native coronary artery without angina pectoris: Secondary | ICD-10-CM | POA: Diagnosis present

## 2020-03-29 DIAGNOSIS — G4733 Obstructive sleep apnea (adult) (pediatric): Secondary | ICD-10-CM | POA: Diagnosis present

## 2020-03-29 DIAGNOSIS — I959 Hypotension, unspecified: Secondary | ICD-10-CM | POA: Diagnosis not present

## 2020-03-29 LAB — BASIC METABOLIC PANEL
Anion gap: 10 (ref 5–15)
BUN: 20 mg/dL (ref 6–20)
CO2: 31 mmol/L (ref 22–32)
Calcium: 9.1 mg/dL (ref 8.9–10.3)
Chloride: 97 mmol/L — ABNORMAL LOW (ref 98–111)
Creatinine, Ser: 1.15 mg/dL — ABNORMAL HIGH (ref 0.44–1.00)
GFR, Estimated: 59 mL/min — ABNORMAL LOW (ref >60)
Glucose, Bld: 298 mg/dL — ABNORMAL HIGH (ref 70–99)
Potassium: 4.3 mmol/L (ref 3.5–5.1)
Sodium: 138 mmol/L (ref 135–145)

## 2020-03-29 LAB — CBC
HCT: 38.5 % (ref 36.0–46.0)
Hemoglobin: 11.4 g/dL — ABNORMAL LOW (ref 12.0–15.0)
MCH: 23.9 pg — ABNORMAL LOW (ref 26.0–34.0)
MCHC: 29.6 g/dL — ABNORMAL LOW (ref 30.0–36.0)
MCV: 80.9 fL (ref 80.0–100.0)
Platelets: 257 10*3/uL (ref 150–400)
RBC: 4.76 MIL/uL (ref 3.87–5.11)
RDW: 17.1 % — ABNORMAL HIGH (ref 11.5–15.5)
WBC: 9.2 10*3/uL (ref 4.0–10.5)
nRBC: 0 % (ref 0.0–0.2)

## 2020-03-29 LAB — GLUCOSE, CAPILLARY
Glucose-Capillary: 229 mg/dL — ABNORMAL HIGH (ref 70–99)
Glucose-Capillary: 232 mg/dL — ABNORMAL HIGH (ref 70–99)
Glucose-Capillary: 249 mg/dL — ABNORMAL HIGH (ref 70–99)
Glucose-Capillary: 299 mg/dL — ABNORMAL HIGH (ref 70–99)
Glucose-Capillary: 314 mg/dL — ABNORMAL HIGH (ref 70–99)
Glucose-Capillary: 334 mg/dL — ABNORMAL HIGH (ref 70–99)

## 2020-03-29 LAB — T3: T3, Total: 168 ng/dL (ref 71–180)

## 2020-03-29 LAB — TROPONIN I (HIGH SENSITIVITY): Troponin I (High Sensitivity): 2226 ng/L (ref <18)

## 2020-03-29 LAB — MAGNESIUM: Magnesium: 1.9 mg/dL (ref 1.7–2.4)

## 2020-03-29 MED ORDER — INSULIN ASPART PROT & ASPART (70-30 MIX) 100 UNIT/ML ~~LOC~~ SUSP
100.0000 [IU] | Freq: Two times a day (BID) | SUBCUTANEOUS | Status: DC
  Administered 2020-03-29 – 2020-04-01 (×6): 100 [IU] via SUBCUTANEOUS
  Filled 2020-03-29: qty 10

## 2020-03-29 MED ORDER — LORAZEPAM 1 MG PO TABS
1.0000 mg | ORAL_TABLET | Freq: Three times a day (TID) | ORAL | Status: DC | PRN
  Administered 2020-03-29 – 2020-04-02 (×8): 1 mg via ORAL
  Filled 2020-03-29 (×9): qty 1

## 2020-03-29 MED FILL — Tirofiban HCl in NaCl 0.9% IV Soln 5 MG/100ML (Base Equiv): INTRAVENOUS | Qty: 100 | Status: AC

## 2020-03-29 NOTE — Plan of Care (Signed)

## 2020-03-29 NOTE — Progress Notes (Signed)
Progress Note  Patient Name: [name] Date of Encounter: 03/29/2020  [facility] Cardiologist: [name], MD   Subjective   No CP; still dyspneic  Inpatient Medications    Scheduled Meds: . aspirin  81 mg Oral Daily  . atorvastatin  80 mg Oral Daily  . furosemide  40 mg Intravenous BID  . heparin  5,000 Units Subcutaneous Q8H  . influenza vac split quadrivalent PF  0.5 mL Intramuscular Tomorrow-1000  . insulin aspart  0-20 Units Subcutaneous TID WC  . insulin aspart  0-5 Units Subcutaneous QHS  . insulin glargine  25 Units Subcutaneous Daily  . levothyroxine  50 mcg Oral Q0600  . losartan  50 mg Oral Daily  . metoprolol tartrate  25 mg Oral BID  . pantoprazole  40 mg Oral Daily  . sodium chloride flush  3 mL Intravenous Q12H  . ticagrelor  90 mg Oral BID   Continuous Infusions: . sodium chloride    . nitroGLYCERIN 38.333 mcg/min (03/29/20 0639)   PRN Meds: sodium chloride, acetaminophen, LORazepam, ondansetron (ZOFRAN) IV, sodium chloride flush   Vital Signs    Vitals:   03/28/20 2045 03/29/20 0122 03/29/20 0301 03/29/20 0500  BP: (!) 148/91 111/60 (!) 104/56   Pulse: 76 79 79   Resp: 20     Temp: 98 F (36.7 C) 98.4 F (36.9 C) (!) 97.3 F (36.3 C)   TempSrc: Oral Oral Oral   SpO2: 98% 97% 95%   Weight:    (!) 164.8 kg  Height:        Intake/Output Summary (Last 24 hours) at 03/29/2020 0809 Last data filed at 03/29/2020 0746 Gross per 24 hour  Intake 381.14 ml  Output 3100 ml  Net -2718.86 ml   Last 3 Weights 03/29/2020 03/28/2020 03/28/2020  Weight (lbs) 363 lb 5.1 oz 380 lb 355 lb  Weight (kg) 164.8 kg 172.367 kg 161.027 kg      Telemetry    Sinus - Personally Reviewed   Physical Exam   GEN: No acute distress.  Obese  Neck: No JVD Cardiac: RRR Respiratory: Clear to auscultation bilaterally. GI: Soft, nontender, non-distended  MS: 1+ edema; Radial cath site with no hematoma Neuro:  Nonfocal  Psych: Normal affect   Labs     High Sensitivity Troponin:   Recent Labs  Lab 03/28/20 0347 03/28/20 0606 03/28/20 1433 03/28/20 2016 03/29/20 0036  TROPONINIHS 514* 539* 1,302* 1,643* 2,226*      Chemistry Recent Labs  Lab 03/28/20 0347 03/28/20 2016 03/29/20 0036  NA 139  --  138  K 4.4  --  4.3  CL 99  --  97*  CO2 29  --  31  GLUCOSE 279*  --  298*  BUN 19  --  20  CREATININE 1.17* 1.13* 1.15*  CALCIUM 8.6*  --  9.1  GFRNONAA 58* >60 59*  ANIONGAP 11  --  10     Hematology Recent Labs  Lab 03/28/20 0347 03/28/20 2016 03/29/20 0036  WBC 10.0 9.4 9.2  RBC 4.84 4.62 4.76  HGB 11.5* 11.1* 11.4*  HCT 40.1 37.5 38.5  MCV 82.9 81.2 80.9  MCH 23.8* 24.0* 23.9*  MCHC 28.7* 29.6* 29.6*  RDW 17.2* 17.3* 17.1*  PLT 251 234 257    BNP Recent Labs  Lab 03/28/20 0506  BNP 92.3     Radiology    DG Chest 2 View  Result Date: 03/28/2020 CLINICAL DATA:  Shortness of breath EXAM: CHEST - 2  VIEW COMPARISON:  None available FINDINGS: Cardiomegaly and central vascular congestion. There is no edema, consolidation, effusion, or pneumothorax. IMPRESSION: Cardiomegaly and vascular congestion. Electronically Signed   By: Marnee Spring M.D.   On: 03/28/2020 04:54   CARDIAC CATHETERIZATION  Result Date: 03/28/2020  Dist RCA lesion is 25% stenosed.  Mid LAD-1 lesion is 95% stenosed.  A drug-eluting stent was successfully placed using a STENT RESOLUTE ONYX [id number]. The proximal stent was postdilated with a 3.75 mm [place] baloon.  Post intervention, there is a 0% residual stenosis.  Mid LAD-2 lesion is 50% stenosed.  LV end diastolic pressure is moderately elevated. LVEDP 27 mm Hg.  There is no aortic valve stenosis.  Continue dual antiplatelet therapy for 12 months. Consider clopidogrel monotherapy after 12 months, along with aggressive secondary prevention.  Increase statin to high potency.  Continue with plans for diuresis.  D/w Dr. Jens Som.   ECHOCARDIOGRAM COMPLETE  Result Date: 03/28/2020     ECHOCARDIOGRAM REPORT   Patient Name:   [name] Date of Exam: 03/28/2020 Medical Rec #:  [id number]       Height:       66.0 in Accession #:    [id number]      Weight:       355.0 lb Date of Birth:  Nov 22, 1970        BSA:          2.552 m Patient Age:    49 years        BP:           107/60 mmHg Patient Gender: F               HR:           88 bpm. Exam Location:  Inpatient Procedure: 2D Echo, Cardiac Doppler, Color Doppler and Intracardiac            Opacification Agent Indications:    I50.9* Heart failure (unspecified)  History:        Patient has no prior history of Echocardiogram examinations.                 Risk Factors:Morbid obesity. Fluid overload.  Sonographer:    Roosvelt Maser Referring Phys: [id number] Deno Lunger SHALHOUB IMPRESSIONS  1. Overall poor image quality even with definity Septal and apical hypokinesis . Left ventricular ejection fraction, by estimation, is 50 to 55%. The left ventricle has low normal function. The left ventricle has no regional wall motion abnormalities. Left ventricular diastolic parameters were normal.  2. Right ventricular systolic function is normal. The right ventricular size is normal.  3. Left atrial size was mildly dilated.  4. The mitral valve is normal in structure. No evidence of mitral valve regurgitation. No evidence of mitral stenosis.  5. The aortic valve was not well visualized. Aortic valve regurgitation is not visualized. No aortic stenosis is present.  6. The inferior vena cava is normal in size with greater than 50% respiratory variability, suggesting right atrial pressure of 3 mmHg. FINDINGS  Left Ventricle: Overall poor image quality even with definity Septal and apical hypokinesis. Left ventricular ejection fraction, by estimation, is 50 to 55%. The left ventricle has low normal function. The left ventricle has no regional wall motion abnormalities. Definity contrast agent was given IV to delineate the left ventricular endocardial borders. The left  ventricular internal cavity size was normal in size. There is no left ventricular hypertrophy. Left ventricular diastolic parameters were normal. Right Ventricle: The right ventricular  size is normal. No increase in right ventricular wall thickness. Right ventricular systolic function is normal. Left Atrium: Left atrial size was mildly dilated. Right Atrium: Right atrial size was normal in size. Pericardium: There is no evidence of pericardial effusion. Mitral Valve: The mitral valve is normal in structure. No evidence of mitral valve regurgitation. No evidence of mitral valve stenosis. Tricuspid Valve: The tricuspid valve is normal in structure. Tricuspid valve regurgitation is not demonstrated. No evidence of tricuspid stenosis. Aortic Valve: The aortic valve was not well visualized. Aortic valve regurgitation is not visualized. No aortic stenosis is present. Aortic valve mean gradient measures 4.0 mmHg. Aortic valve peak gradient measures 7.6 mmHg. Aortic valve area, by VTI measures 1.94 cm. Pulmonic Valve: The pulmonic valve was normal in structure. Pulmonic valve regurgitation is not visualized. No evidence of pulmonic stenosis. Aorta: The aortic root is normal in size and structure. Venous: The inferior vena cava is normal in size with greater than 50% respiratory variability, suggesting right atrial pressure of 3 mmHg. IAS/Shunts: No atrial level shunt detected by color flow Doppler.  LEFT VENTRICLE PLAX 2D LVIDd:         5.80 cm  Diastology LVIDs:         3.70 cm  LV e' medial:    7.51 cm/s LV PW:         1.00 cm  LV E/e' medial:  12.4 LV IVS:        1.00 cm  LV e' lateral:   8.49 cm/s LVOT diam:     1.80 cm  LV E/e' lateral: 11.0 LV SV:         50 LV SV Index:   20 LVOT Area:     2.54 cm  LEFT ATRIUM            Index LA diam:      4.20 cm  1.65 cm/m LA Vol (A4C): 122.0 ml 47.80 ml/m  AORTIC VALVE AV Area (Vmax):    1.84 cm AV Area (Vmean):   2.03 cm AV Area (VTI):     1.94 cm AV Vmax:            138.00 cm/s AV Vmean:          87.700 cm/s AV VTI:            0.259 m AV Peak Grad:      7.6 mmHg AV Mean Grad:      4.0 mmHg LVOT Vmax:         100.00 cm/s LVOT Vmean:        70.100 cm/s LVOT VTI:          0.197 m LVOT/AV VTI ratio: 0.76  AORTA Ao Root diam: 3.20 cm Ao Asc diam:  3.60 cm MITRAL VALVE MV Area (PHT): 7.66 cm    SHUNTS MV Decel Time: 99 msec     Systemic VTI:  0.20 m MV E velocity: 93.30 cm/s  Systemic Diam: 1.80 cm MV A velocity: 86.10 cm/s MV E/A ratio:  1.08 Charlton HawsPeter Nishan MD Electronically signed by Charlton HawsPeter Nishan MD Signature Date/Time: 03/28/2020/9:24:29 AM    Final     Patient Profile     50 y.o. female with past medical history of diabetes mellitus, hypertension, hyperlipidemia, obstructive sleep apnea, morbid obesity admitted with acute combined systolic/diastolic congestive heart failure and non-ST elevation myocardial infarction.  Echocardiogram showed ejection fraction 50 to 55% with apical hypokinesis, mild left atrial enlargement.  Developed recurrent chest pain and had  urgent cardiac catheterization which revealed 95% mid LAD which was treated with a drug-eluting stent.  Left ventricular end-diastolic pressure twenty-seven.  Assessment & Plan    1 non-ST elevation myocardial infarction-patient now is status post PCI of LAD.  Continue aspirin, Brilinta and statin.  Continue metoprolol.  LV function is preserved.  2 acute combined systolic/diastolic congestive heart failure-LVEDP 27 yesterday p.m.  Remains volume overloaded.  Continue Lasix 40 mg twice daily.  Follow renal function closely.  3 hyperlipidemia-continue statin.  4 hypertension-blood pressure controlled.  Continue present medications.  5 morbid obesity-needs weight loss.  For questions or updates, please contact [facility] Please consult [email or web address] for contact info under        Signed, [name], MD  03/29/2020, 8:09 AM

## 2020-03-29 NOTE — Assessment & Plan Note (Addendum)
-   BP soft am of 3/4 - d/c losartan per cardiology - BP stable on lasix, spirono, lopressor

## 2020-03-29 NOTE — Assessment & Plan Note (Addendum)
-   trops not fully trended to peak but escalated up to 2226. - now s/p LHC on 3/1 and underwent 1 DES to mid LAD (95% stenosis) - continue asa/brilinta. Copay for Brilinta is $412; even with prescription assistance, will still be unaffordable.  Tentative plan will be for 1 month Brilinta with free co-pay card then transition to Plavix to finish out therapy - remains CP free; anxiety also improving  -Prescriptions provided at discharge

## 2020-03-29 NOTE — Hospital Course (Addendum)
[name] is a 50 yo female with PMH morbid obesity, IDDM, HTN, hypothyroidism, depression, GERD who presented to the hospital with worsening shortness of breath and chest pain.  She described worsening orthopnea and PND.  She underwent further work-up and was found to have elevated troponins and wall motion abnormalities on echo.  She was classified as NSTEMI and taken urgently to the Cath Lab on 03/28/2020.  She underwent placement of 1 DES to the mid LAD and had improvement post procedure in her chest pain. She still had ongoing shortness of breath which was considered being contributed from underlying low normal LV function and was started on diuresis with IV Lasix.  See below for further problem-based plan.

## 2020-03-29 NOTE — Assessment & Plan Note (Addendum)
-  Continue Lipitor which has been increased to max dose and she then developed severe myalgias on 03/31/20 - d/c lipitor - start crestor at higher dose than she was on lipitor at home and we'll start with 3 times weekly then can be adjusted further outpatient

## 2020-03-29 NOTE — Assessment & Plan Note (Addendum)
-   TSH 5.423, FT4 1.02 (normal) and TT3 168 (normal) -Continue Synthroid

## 2020-03-29 NOTE — Progress Notes (Signed)
PROGRESS NOTE    [name]   UJW:[id number]  DOB: 11/25/1970  DOA: 03/28/2020     0  PCP: Patient, No Pcp Per  CC: CP, SOB  Hospital Course: [name] is a 50 yo female with PMH morbid obesity, IDDM, HTN, hypothyroidism, depression, GERD who presented to the hospital with worsening shortness of breath and chest pain.  She described worsening orthopnea and PND.  She underwent further work-up and was found to have elevated troponins and wall motion abnormalities on echo.  She was classified as NSTEMI and taken urgently to the Cath Lab on 03/28/2020.  She underwent placement of 1 DES to the mid LAD and had improvement post procedure in her chest pain. She still had ongoing shortness of breath which was considered being contributed from underlying low normal LV function and was started on diuresis with IV Lasix.   Interval History:  Underwent cath yesterday and is now chest pain-free today.  Still endorsing some shortness of breath and anxiety.  She feels anxious just from all of the events that have occurred and is still processing.  Was asking for something to help with her anxiety this morning.  Voiding very well with Lasix and still endorsing significant edema in her legs.  States that she still has to sleep almost upright otherwise becomes short of breath.  ROS: Constitutional: negative for chills and fevers, Respiratory: positive for dyspnea on exertion, Cardiovascular: positive for orthopnea and paroxysmal nocturnal dyspnea and Gastrointestinal: negative for abdominal pain  Assessment & Plan: * NSTEMI (non-ST elevated myocardial infarction) (HCC) - trops not fully trended to peak but escalated up to 2226. - now s/p LHC on 3/1 and underwent 1 DES to mid LAD (95% stenosis) - continue asa/brilinta - CP free when seen this am; still having some anxiety issues 2/2 the situation which is not unexpected and should improve as she processes further   Uncontrolled type 2 diabetes mellitus  with hyperglycemia, with long-term current use of insulin (HCC) - A1c 7.7% on 03/28/20; prior A1c noted 11.7% on 12/29/19 - on very large doses of 70/30 outpatient; raises some concern for unintentional administration in scar tissues, etc - will convert from Lantus back to 70/30 to find steady dose  Acute on chronic congestive heart failure (HCC) - elevated LVEDP during cath, 27 mmHg - echo on 3/1 showed EF 50-55% EF; read as low/normal function per cardiology with WMAs  - continue IV lasix  - strict I&O  Major depressive disorder -No chronic home meds -Continue Ativan for now as patient is anxious with processing all the events of the hospitalization  Hypothyroidism - TSH 5.423, FT4 1.02 (normal) and TT3 168 (normal) -Continue Synthroid  Mixed hyperlipidemia due to type 2 diabetes mellitus (HCC) -Continue Lipitor which has been increased to max dose -Outpatient she takes her statin 3 times a week due to some muscle pains; will need to monitor her response to increase  GERD without esophagitis -Continue Protonix  Essential hypertension -Continue losartan and Lopressor    Old records reviewed in assessment of this patient  Antimicrobials: n/a  DVT prophylaxis: heparin injection 5,000 Units Start: 03/28/20 2200   Code Status:   Code Status: Full Code Family Communication: none present  Disposition Plan: Status is: Observation  The patient remains OBS appropriate and will d/c before 2 midnights.  Dispo: The patient is from: Home              Anticipated d/c is to: Home  Patient currently is not medically stable to d/c.   Difficult to place patient No  Risk of unplanned readmission score:     Objective: Blood pressure 135/66, pulse 78, temperature 99.1 F (37.3 C), temperature source Oral, resp. rate 18, height  (1.676 m), weight (!) 164.8 kg, last menstrual period 12/07/2019, SpO2 91 %.  Examination: General appearance: alert, cooperative and no  distress Head: Normocephalic, without obvious abnormality, atraumatic Eyes: EOMI Lungs: clear to auscultation bilaterally Heart: regular rate and rhythm and S1, S2 normal Abdomen: obese, soft, NT, BS distant and present Extremities: 3+ LE edema Skin: mobility and turgor normal Neurologic: Grossly normal  Consultants:   Cardiology  Procedures:   LHC, 03/28/20  Data Reviewed: I have personally reviewed following labs and imaging studies Results for orders placed or performed during the hospital encounter of 03/28/20 (from the past 24 hour(s))  Glucose, capillary     Status: Abnormal   Collection Time: 03/28/20  4:03 PM  Result Value Ref Range   Glucose-Capillary 248 (H) 70 - 99 mg/dL  T4, free     Status: None   Collection Time: 03/28/20  4:33 PM  Result Value Ref Range   Free T4 1.02 0.61 - 1.12 ng/dL  T3     Status: None   Collection Time: 03/28/20  4:33 PM  Result Value Ref Range   T3, Total 168 71 - 180 ng/dL  POCT Activated clotting time     Status: None   Collection Time: 03/28/20  6:02 PM  Result Value Ref Range   Activated Clotting Time 321 seconds  Troponin I (High Sensitivity)     Status: Abnormal   Collection Time: 03/28/20  8:16 PM  Result Value Ref Range   Troponin I (High Sensitivity) 1,643 (HH) <18 ng/L  CBC     Status: Abnormal   Collection Time: 03/28/20  8:16 PM  Result Value Ref Range   WBC 9.4 4.0 - 10.5 K/uL   RBC 4.62 3.87 - 5.11 MIL/uL   Hemoglobin 11.1 (L) 12.0 - 15.0 g/dL   HCT 16.1 09.6 - 04.5 %   MCV 81.2 80.0 - 100.0 fL   MCH 24.0 (L) 26.0 - 34.0 pg   MCHC 29.6 (L) 30.0 - 36.0 g/dL   RDW 40.9 (H) 81.1 - 91.4 %   Platelets 234 150 - 400 K/uL   nRBC 0.0 0.0 - 0.2 %  Creatinine, serum     Status: Abnormal   Collection Time: 03/28/20  8:16 PM  Result Value Ref Range   Creatinine, Ser 1.13 (H) 0.44 - 1.00 mg/dL   GFR, Estimated >78 >29 mL/min  CBC     Status: Abnormal   Collection Time: 03/29/20 12:36 AM  Result Value Ref Range   WBC 9.2  4.0 - 10.5 K/uL   RBC 4.76 3.87 - 5.11 MIL/uL   Hemoglobin 11.4 (L) 12.0 - 15.0 g/dL   HCT 56.2 13.0 - 86.5 %   MCV 80.9 80.0 - 100.0 fL   MCH 23.9 (L) 26.0 - 34.0 pg   MCHC 29.6 (L) 30.0 - 36.0 g/dL   RDW 78.4 (H) 69.6 - 29.5 %   Platelets 257 150 - 400 K/uL   nRBC 0.0 0.0 - 0.2 %  Basic metabolic panel     Status: Abnormal   Collection Time: 03/29/20 12:36 AM  Result Value Ref Range   Sodium 138 135 - 145 mmol/L   Potassium 4.3 3.5 - 5.1 mmol/L   Chloride 97 (L) 98 -  111 mmol/L   CO2 31 22 - 32 mmol/L   Glucose, Bld 298 (H) 70 - 99 mg/dL   BUN 20 6 - 20 mg/dL   Creatinine, Ser 4.25 (H) 0.44 - 1.00 mg/dL   Calcium 9.1 8.9 - 95.6 mg/dL   GFR, Estimated 59 (L) >60 mL/min   Anion gap 10 5 - 15  Magnesium     Status: None   Collection Time: 03/29/20 12:36 AM  Result Value Ref Range   Magnesium 1.9 1.7 - 2.4 mg/dL  Troponin I (High Sensitivity)     Status: Abnormal   Collection Time: 03/29/20 12:36 AM  Result Value Ref Range   Troponin I (High Sensitivity) 2,226 (HH) <18 ng/L  Glucose, capillary     Status: Abnormal   Collection Time: 03/29/20  1:15 AM  Result Value Ref Range   Glucose-Capillary 314 (H) 70 - 99 mg/dL  Glucose, capillary     Status: Abnormal   Collection Time: 03/29/20  8:13 AM  Result Value Ref Range   Glucose-Capillary 334 (H) 70 - 99 mg/dL  Glucose, capillary     Status: Abnormal   Collection Time: 03/29/20 11:24 AM  Result Value Ref Range   Glucose-Capillary 299 (H) 70 - 99 mg/dL    Recent Results (from the past 240 hour(s))  Resp Panel by RT-PCR (Flu A&B, Covid) Nasopharyngeal Swab     Status: None   Collection Time: 03/28/20  6:07 AM   Specimen: Nasopharyngeal Swab; Nasopharyngeal(NP) swabs in vial transport medium  Result Value Ref Range Status   SARS Coronavirus 2 by RT PCR NEGATIVE NEGATIVE Final    Comment: (NOTE) SARS-CoV-2 target nucleic acids are NOT DETECTED.  The SARS-CoV-2 RNA is generally detectable in upper respiratory specimens  during the acute phase of infection. The lowest concentration of SARS-CoV-2 viral copies this assay can detect is 138 copies/mL. A negative result does not preclude SARS-Cov-2 infection and should not be used as the sole basis for treatment or other patient management decisions. A negative result may occur with  improper specimen collection/handling, submission of specimen other than nasopharyngeal swab, presence of viral mutation(s) within the areas targeted by this assay, and inadequate number of viral copies(<138 copies/mL). A negative result must be combined with clinical observations, patient history, and epidemiological information. The expected result is Negative.  Fact Sheet for Patients:  BloggerCourse.com  Fact Sheet for Healthcare Providers:  SeriousBroker.it  This test is no t yet approved or cleared by the [place] FDA and  has been authorized for detection and/or diagnosis of SARS-CoV-2 by FDA under an Emergency Use Authorization (EUA). This EUA will remain  in effect (meaning this test can be used) for the duration of the COVID-19 declaration under Section 564(b)(1) of the Act, 21 U.S.C.section 360bbb-3(b)(1), unless the authorization is terminated  or revoked sooner.       Influenza A by PCR NEGATIVE NEGATIVE Final   Influenza B by PCR NEGATIVE NEGATIVE Final    Comment: (NOTE) The Xpert Xpress SARS-CoV-2/FLU/RSV plus assay is intended as an aid in the diagnosis of influenza from Nasopharyngeal swab specimens and should not be used as a sole basis for treatment. Nasal washings and aspirates are unacceptable for Xpert Xpress SARS-CoV-2/FLU/RSV testing.  Fact Sheet for Patients: BloggerCourse.com  Fact Sheet for Healthcare Providers: SeriousBroker.it  This test is not yet approved or cleared by the [place] FDA and has been authorized for detection  and/or diagnosis of SARS-CoV-2 by FDA under an Emergency Use Authorization (  EUA). This EUA will remain in effect (meaning this test can be used) for the duration of the COVID-19 declaration under Section 564(b)(1) of the Act, 21 U.S.C. section 360bbb-3(b)(1), unless the authorization is terminated or revoked.  Performed at [facility], [address] [place], [place] [address]      Radiology Studies: DG Chest 2 View  Result Date: 03/28/2020 CLINICAL DATA:  Shortness of breath EXAM: CHEST - 2 VIEW COMPARISON:  None available FINDINGS: Cardiomegaly and central vascular congestion. There is no edema, consolidation, effusion, or pneumothorax. IMPRESSION: Cardiomegaly and vascular congestion. Electronically Signed   By: Marnee Spring M.D.   On: 03/28/2020 04:54   CARDIAC CATHETERIZATION  Result Date: 03/28/2020  Dist RCA lesion is 25% stenosed.  Mid LAD-1 lesion is 95% stenosed.  A drug-eluting stent was successfully placed using a STENT RESOLUTE ONYX [id number]. The proximal stent was postdilated with a 3.75 mm [place] baloon.  Post intervention, there is a 0% residual stenosis.  Mid LAD-2 lesion is 50% stenosed.  LV end diastolic pressure is moderately elevated. LVEDP 27 mm Hg.  There is no aortic valve stenosis.  Continue dual antiplatelet therapy for 12 months. Consider clopidogrel monotherapy after 12 months, along with aggressive secondary prevention.  Increase statin to high potency.  Continue with plans for diuresis.  D/w Dr. Jens Som.   ECHOCARDIOGRAM COMPLETE  Result Date: 03/28/2020    ECHOCARDIOGRAM REPORT   Patient Name:   [name] Date of Exam: 03/28/2020 Medical Rec #:  [id number]       Height:       66.0 in Accession #:    [id number]      Weight:       355.0 lb Date of Birth:  05/18/1970        BSA:          2.552 m Patient Age:    49 years        BP:           107/60 mmHg Patient Gender: F               HR:           88 bpm. Exam Location:  Inpatient Procedure: 2D Echo,  Cardiac Doppler, Color Doppler and Intracardiac            Opacification Agent Indications:    I50.9* Heart failure (unspecified)  History:        Patient has no prior history of Echocardiogram examinations.                 Risk Factors:Morbid obesity. Fluid overload.  Sonographer:    Roosvelt Maser Referring Phys: [id number] Deno Lunger SHALHOUB IMPRESSIONS  1. Overall poor image quality even with definity Septal and apical hypokinesis . Left ventricular ejection fraction, by estimation, is 50 to 55%. The left ventricle has low normal function. The left ventricle has no regional wall motion abnormalities. Left ventricular diastolic parameters were normal.  2. Right ventricular systolic function is normal. The right ventricular size is normal.  3. Left atrial size was mildly dilated.  4. The mitral valve is normal in structure. No evidence of mitral valve regurgitation. No evidence of mitral stenosis.  5. The aortic valve was not well visualized. Aortic valve regurgitation is not visualized. No aortic stenosis is present.  6. The inferior vena cava is normal in size with greater than 50% respiratory variability, suggesting right atrial pressure of 3 mmHg. FINDINGS  Left Ventricle: Overall poor  image quality even with definity Septal and apical hypokinesis. Left ventricular ejection fraction, by estimation, is 50 to 55%. The left ventricle has low normal function. The left ventricle has no regional wall motion abnormalities. Definity contrast agent was given IV to delineate the left ventricular endocardial borders. The left ventricular internal cavity size was normal in size. There is no left ventricular hypertrophy. Left ventricular diastolic parameters were normal. Right Ventricle: The right ventricular size is normal. No increase in right ventricular wall thickness. Right ventricular systolic function is normal. Left Atrium: Left atrial size was mildly dilated. Right Atrium: Right atrial size was normal in size.  Pericardium: There is no evidence of pericardial effusion. Mitral Valve: The mitral valve is normal in structure. No evidence of mitral valve regurgitation. No evidence of mitral valve stenosis. Tricuspid Valve: The tricuspid valve is normal in structure. Tricuspid valve regurgitation is not demonstrated. No evidence of tricuspid stenosis. Aortic Valve: The aortic valve was not well visualized. Aortic valve regurgitation is not visualized. No aortic stenosis is present. Aortic valve mean gradient measures 4.0 mmHg. Aortic valve peak gradient measures 7.6 mmHg. Aortic valve area, by VTI measures 1.94 cm. Pulmonic Valve: The pulmonic valve was normal in structure. Pulmonic valve regurgitation is not visualized. No evidence of pulmonic stenosis. Aorta: The aortic root is normal in size and structure. Venous: The inferior vena cava is normal in size with greater than 50% respiratory variability, suggesting right atrial pressure of 3 mmHg. IAS/Shunts: No atrial level shunt detected by color flow Doppler.  LEFT VENTRICLE PLAX 2D LVIDd:         5.80 cm  Diastology LVIDs:         3.70 cm  LV e' medial:    7.51 cm/s LV PW:         1.00 cm  LV E/e' medial:  12.4 LV IVS:        1.00 cm  LV e' lateral:   8.49 cm/s LVOT diam:     1.80 cm  LV E/e' lateral: 11.0 LV SV:         50 LV SV Index:   20 LVOT Area:     2.54 cm  LEFT ATRIUM            Index LA diam:      4.20 cm  1.65 cm/m LA Vol (A4C): 122.0 ml 47.80 ml/m  AORTIC VALVE AV Area (Vmax):    1.84 cm AV Area (Vmean):   2.03 cm AV Area (VTI):     1.94 cm AV Vmax:           138.00 cm/s AV Vmean:          87.700 cm/s AV VTI:            0.259 m AV Peak Grad:      7.6 mmHg AV Mean Grad:      4.0 mmHg LVOT Vmax:         100.00 cm/s LVOT Vmean:        70.100 cm/s LVOT VTI:          0.197 m LVOT/AV VTI ratio: 0.76  AORTA Ao Root diam: 3.20 cm Ao Asc diam:  3.60 cm MITRAL VALVE MV Area (PHT): 7.66 cm    SHUNTS MV Decel Time: 99 msec     Systemic VTI:  0.20 m MV E velocity:  93.30 cm/s  Systemic Diam: 1.80 cm MV A velocity: 86.10 cm/s MV E/A ratio:  1.08 Charlton Haws MD  Electronically signed by Charlton Haws MD Signature Date/Time: 03/28/2020/9:24:29 AM    Final    DG Chest 2 View  Final Result      Scheduled Meds: . aspirin  81 mg Oral Daily  . atorvastatin  80 mg Oral Daily  . furosemide  40 mg Intravenous BID  . heparin  5,000 Units Subcutaneous Q8H  . influenza vac split quadrivalent PF  0.5 mL Intramuscular Tomorrow-1000  . insulin aspart  0-20 Units Subcutaneous TID WC  . insulin aspart  0-5 Units Subcutaneous QHS  . insulin aspart protamine- aspart  100 Units Subcutaneous BID WC  . levothyroxine  50 mcg Oral Q0600  . losartan  50 mg Oral Daily  . metoprolol tartrate  25 mg Oral BID  . pantoprazole  40 mg Oral Daily  . sodium chloride flush  3 mL Intravenous Q12H  . ticagrelor  90 mg Oral BID   PRN Meds: sodium chloride, acetaminophen, LORazepam, ondansetron (ZOFRAN) IV, sodium chloride flush Continuous Infusions: . sodium chloride       LOS: 0 days  Time spent: Greater than 50% of the 35 minute visit was spent in counseling/coordination of care for the patient as laid out in the A&P.   Lewie Chamber, MD [facility] 03/29/2020, 3:09 PM

## 2020-03-29 NOTE — Assessment & Plan Note (Signed)
Continue Protonix °

## 2020-03-29 NOTE — Progress Notes (Signed)
Inpatient Diabetes Program Recommendations  AACE/ADA: New Consensus Statement on Inpatient Glycemic Control (2015)  Target Ranges:  Prepandial:   less than 140 mg/dL      Peak postprandial:   less than 180 mg/dL (1-2 hours)      Critically ill patients:  140 - 180 mg/dL   Lab Results  Component Value Date   GLUCAP 334 (H) 03/29/2020   HGBA1C 7.7 (H) 03/28/2020    Review of Glycemic Control Results for [name], [name] (MRN [id number]) as of 03/29/2020 09:46  Ref. Range 03/28/2020 12:12 03/28/2020 16:03 03/29/2020 01:15 03/29/2020 08:13  Glucose-Capillary Latest Ref Range: 70 - 99 mg/dL 992 (H) 426 (H) 834 (H) 334 (H)   NSTEMI/CHF Diabetes history: DM 2 Outpatient Diabetes medications: 70/30 125 units qam, 135 units qpm, metformin 1000 mg bid Current orders for Inpatient glycemic control:  Lantus 25 units Daily Novolog 0-20 units Q4 hours  Inpatient Diabetes Program Recommendations:    Consider increasing Lantus to 25 units BID and adding Novolog 8 units TID (Assuming patient is consuming >50% of meals). Anticipate need for further titration while inpatient.  Per DM coordinator note from 3/1: Watch on current insulin regimen. Will need to convert to 70/30 after procedures are completed.  Per PCP at visit on 12/29/2019, Dr. Elizabeth Palau, pt has exhausted all methods she knows to control DM. Based on her financial limitations and compliance issues. The ppt reported she understood that failure to treat diabetes aggressively will result in continued long term complications and general feeling of unwell. She is once again resistant to adjusting insulin and intolerant to sulfonylurea, GLP1. Dr. Dareen Piano said she is not a good candidate for SGLT 2 b/o hygiene issues as well as the cost of higher copays. Couldn't afford basal/bolus insulin due to cost as well.  Thanks, [name], MSN, RNC-OB Diabetes Coordinator [phone] (8a-5p)

## 2020-03-29 NOTE — Progress Notes (Signed)
   03/28/20 2130  Provider Notification  Provider Name/Title Dr Rex Kras  Date Provider Notified 03/28/20  Time Provider Notified 2130  Notification Type Page  Notification Reason Change in status  Provider response At bedside;See new orders  Date of Provider Response 03/28/20  Time of Provider Response 2135    Patient with complaints of chest tightness and pressure, feels like can not catch her breath.  Provider at bedside, new orders received   RN will continue to monitor patient

## 2020-03-29 NOTE — Progress Notes (Signed)
Pharmacy Medication related note  Test claim submitted on 3/2 for ticagrelor indicated patient copay of $412.72 as patient has an outstanding deductible of $4950.  Information given to cardiology team to consider transitioning to clopidogrel after 1 month of ticagrelor.   Will pass information on to patient and provide medication education prior to discharge.   Sheppard Coil PharmD., BCPS Clinical Pharmacist 03/29/2020 1:37 PM

## 2020-03-29 NOTE — Assessment & Plan Note (Addendum)
-  No chronic home meds -Ativan as needed during hospitalization.  Not continued at discharge

## 2020-03-29 NOTE — Progress Notes (Addendum)
CARDIAC REHAB PHASE I   PRE:  Rate/Rhythm: 81 SR    BP: sitting 115/62    SaO2: 85-91 RA in recliner  MODE:  Ambulation: to EOB then BR   POST:  Rate/Rhythm: 87 SR    BP: sitting 135/68     SaO2: 89-90 RA  Pt ambulated with SW from recliner by window to sink but felt legs weak and had to urgently sit on EOB. Then to BR independently. Able to stand independently but needed assist with cleaning. Ambulated back to recliner. Struggles to elevate feet long due to neuropathy, back and knee problems. Encouraged elevation as tolerated. Pt SaO2 ranges from 85-90 RA in recliner and with ambulation. Denied CP. Can walk to BR independently with SW.   Long discussion of MI, stent, Brilinta, restrictions, DM (which A1C is down from 12 to 7.7), low sodium diet, HF/fluid management, NTG and CRPII. Encouraged stationary bike or stepper or water aerobics in summer as pt cannot ambulate but 20 ft at this time. Will refer to [place] CRPII. Pt receptive to ed and has made diet changes recently at home. Long discussion of diet. She understands importance of Brilinta. Set up MI video. [phone]  [name] CES, ACSM 03/29/2020 9:57 AM

## 2020-03-29 NOTE — Assessment & Plan Note (Addendum)
-   elevated LVEDP during cath, 27 mmHg - echo on 3/1 showed EF 50-55% EF; read as low/normal function per cardiology with WMAs  -Treated with IV Lasix with good diuretic response during hospitalization.  Slight increase in renal function prior to discharge therefore Lasix will be held for 1 day and resumed after discharge on 04/05/2020 -Continue Lasix 80 mg p.o. daily and KCl 10 mEq daily - continued on spironolactone also  - outpatient BMP recommended within 1 week at discharge

## 2020-03-29 NOTE — Assessment & Plan Note (Addendum)
-   A1c 7.7% on 03/28/20; prior A1c noted 11.7% on 12/29/19 - on very large doses of 70/30 outpatient; raises some concern for unintentional administration in scar tissues, etc -Has been starting to feel lethargic/weak and describing symptoms of hypoglycemia that she experiences at home.  She has been on lower doses of insulin during hospitalization than her home doses; also raising concern for probable noncompliance with diet at home as she has been on carb consistent diet while in-house -Decrease NovoLog 70/30 down to 90 units twice daily and hold as needed for hypoglycemic symptoms; resumed on home regimen but patient aware to hold if hypoglycemic symptoms and try to monitor diet being followed

## 2020-03-30 LAB — GLUCOSE, CAPILLARY
Glucose-Capillary: 220 mg/dL — ABNORMAL HIGH (ref 70–99)
Glucose-Capillary: 173 mg/dL — ABNORMAL HIGH (ref 70–99)
Glucose-Capillary: 119 mg/dL — ABNORMAL HIGH (ref 70–99)
Glucose-Capillary: 177 mg/dL — ABNORMAL HIGH (ref 70–99)

## 2020-03-30 LAB — BASIC METABOLIC PANEL
Anion gap: 11 (ref 5–15)
BUN: 19 mg/dL (ref 6–20)
CO2: 30 mmol/L (ref 22–32)
Calcium: 9.1 mg/dL (ref 8.9–10.3)
Chloride: 97 mmol/L — ABNORMAL LOW (ref 98–111)
Creatinine, Ser: 1.27 mg/dL — ABNORMAL HIGH (ref 0.44–1.00)
GFR, Estimated: 52 mL/min — ABNORMAL LOW (ref >60)
Glucose, Bld: 229 mg/dL — ABNORMAL HIGH (ref 70–99)
Potassium: 3.6 mmol/L (ref 3.5–5.1)
Sodium: 138 mmol/L (ref 135–145)

## 2020-03-30 LAB — CBC
HCT: 37.1 % (ref 36.0–46.0)
Hemoglobin: 11.5 g/dL — ABNORMAL LOW (ref 12.0–15.0)
MCH: 24.5 pg — ABNORMAL LOW (ref 26.0–34.0)
MCHC: 31 g/dL (ref 30.0–36.0)
MCV: 78.9 fL — ABNORMAL LOW (ref 80.0–100.0)
Platelets: 239 10*3/uL (ref 150–400)
RBC: 4.7 MIL/uL (ref 3.87–5.11)
RDW: 17.3 % — ABNORMAL HIGH (ref 11.5–15.5)
WBC: 9.7 10*3/uL (ref 4.0–10.5)
nRBC: 0 % (ref 0.0–0.2)

## 2020-03-30 LAB — MAGNESIUM: Magnesium: 1.8 mg/dL (ref 1.7–2.4)

## 2020-03-30 MED ORDER — FUROSEMIDE 10 MG/ML IJ SOLN
80.0000 mg | Freq: Two times a day (BID) | INTRAMUSCULAR | Status: DC
  Administered 2020-03-30 – 2020-04-02 (×8): 80 mg via INTRAVENOUS
  Filled 2020-03-30 (×8): qty 8

## 2020-03-30 MED ORDER — ORAL CARE MOUTH RINSE
15.0000 mL | Freq: Two times a day (BID) | OROMUCOSAL | Status: DC
  Administered 2020-03-30 – 2020-04-02 (×8): 15 mL via OROMUCOSAL

## 2020-03-30 MED ORDER — NYSTATIN 100000 UNIT/GM EX POWD
Freq: Three times a day (TID) | CUTANEOUS | Status: DC
  Filled 2020-03-30: qty 15

## 2020-03-30 MED ORDER — FUROSEMIDE 10 MG/ML IJ SOLN
80.0000 mg | Freq: Two times a day (BID) | INTRAMUSCULAR | Status: DC
  Filled 2020-03-30: qty 8

## 2020-03-30 MED ORDER — MAGNESIUM OXIDE 400 (241.3 MG) MG PO TABS
800.0000 mg | ORAL_TABLET | Freq: Once | ORAL | Status: AC
  Administered 2020-03-30: 800 mg via ORAL
  Filled 2020-03-30: qty 2

## 2020-03-30 MED ORDER — POTASSIUM CHLORIDE CRYS ER 20 MEQ PO TBCR
40.0000 meq | EXTENDED_RELEASE_TABLET | Freq: Once | ORAL | Status: AC
  Administered 2020-03-30: 40 meq via ORAL
  Filled 2020-03-30: qty 2

## 2020-03-30 NOTE — Plan of Care (Signed)
  Problem: Clinical Measurements: Goal: Ability to maintain clinical measurements within normal limits will improve Outcome: Progressing   Problem: Clinical Measurements: Goal: Will remain free from infection Outcome: Progressing   

## 2020-03-30 NOTE — Progress Notes (Signed)
Heart Failure Stewardship Pharmacist Progress Note   PCP: Patient, No Pcp Per PCP-Cardiologist: [name], MD    HPI:  50 YO female with PMH of DM, HTN, HLD, OSA, obesity, hospitalized for worsening SOB, orthopnea and chest pain. Cardiac workup revealed NSTEMI. Developed recurrent chest pain and was urgently taken to cath lab and underwent stent placement to the mid LAD. Patient reports symptom relief post procedure. ECHO completed with EF of 50-55%.   Current HF Medications: Furosemide 80 mg IV BID Metoprolol tartrate 25 mg BID Losartan 50 mg daily   Prior to admission HF Medications: Losartan 50 mg  Atenolol 50 mg   Pertinent Lab Values: . Serum creatinine 1.27, BUN 19, Potassium 3.6, Sodium 138, BNP 92.3, Magnesium 1.8   Vital Signs: . Weight: 164.8 kg (admission weight: 161 kg) . Blood pressure: 100-130s/60-70s . Heart rate: 70s   Medication Assistance / Insurance Benefits Check: Does the patient have prescription insurance?  Yes Type of insurance plan: [employer] - commercial    Outpatient Pharmacy:  Prior to admission outpatient pharmacy: [facility] Is the patient willing to use [facility] TOC pharmacy at discharge? Yes Is the patient willing to transition their outpatient pharmacy to utilize a [facility] outpatient pharmacy?   Pending    Assessment: 1. Acute on chronic systolic CHF (EF 69-48%). NYHA class II symptoms. - Continue furosemide 80 mg IV BID; increased today from 40 mg IV BID; adequate urine output, renal function stable  - Consider switching losartan to Entresto 24-26 mg BID.  - Consider switching metoprolol to long acting formulation (succinate) prior to discharge  - Consider spironolactone prior to discharge - Avoid SGLT-2 due to hygiene limitations    Plan: 1) Medication changes recommended at this time: - Consider switching losartan to Entresto 24-26 mg BID  2) Patient assistance application(s): Test claims completed showing the  following: -Entresto copay: $200/month - can use copay card which would allow for a $10 copay per 3 month supply  3)  Education  - To be completed prior to discharge  Theodis Sato, PharmD PGY-1 Community Pharmacy Resident  03/30/2020 2:56 PM  Sharen Hones, PharmD, BCPS Heart Failure Stewardship Pharmacist Phone [phone]

## 2020-03-30 NOTE — Progress Notes (Signed)
PROGRESS NOTE    Caitlin MustardCrystal Seelig   YNW:[id number]  DOB: 01/02/1971  DOA: 03/28/2020     1  PCP: Patient, No Pcp Per  CC: CP, SOB  Hospital Course: [name] is a 50 yo female with PMH morbid obesity, IDDM, HTN, hypothyroidism, depression, GERD who presented to the hospital with worsening shortness of breath and chest pain.  She described worsening orthopnea and PND.  She underwent further work-up and was found to have elevated troponins and wall motion abnormalities on echo.  She was classified as NSTEMI and taken urgently to the Cath Lab on 03/28/2020.  She underwent placement of 1 DES to the mid LAD and had improvement post procedure in her chest pain. She still had ongoing shortness of breath which was considered being contributed from underlying low normal LV function and was started on diuresis with IV Lasix.   Interval History:  Sitting up in chair bedside this morning.  Appeared comfortable still stating she has shortness of breath especially when lying down.  Still denying any chest pain.  Agrees that she has been voiding well and is happy about that also.  Does feel like she can tell a small difference in the swelling in her legs also.  ROS: Constitutional: negative for chills and fevers, Respiratory: positive for dyspnea on exertion, Cardiovascular: positive for orthopnea and paroxysmal nocturnal dyspnea and Gastrointestinal: negative for abdominal pain  Assessment & Plan: * NSTEMI (non-ST elevated myocardial infarction) (HCC) - trops not fully trended to peak but escalated up to 2226. - now s/p LHC on 3/1 and underwent 1 DES to mid LAD (95% stenosis) - continue asa/brilinta - CP free when seen this am; still having some anxiety issues 2/2 the situation which is not unexpected and should improve as she processes further   Uncontrolled type 2 diabetes mellitus with hyperglycemia, with long-term current use of insulin (HCC) - A1c 7.7% on 03/28/20; prior A1c noted 11.7% on  12/29/19 - on very large doses of 70/30 outpatient; raises some concern for unintentional administration in scar tissues, etc - will convert from Lantus back to 70/30 to find steady dose: currently stable with 70/30 100 units BID  Acute on chronic congestive heart failure (HCC) - elevated LVEDP during cath, 27 mmHg - echo on 3/1 showed EF 50-55% EF; read as low/normal function per cardiology with WMAs  - continue IV lasix; dosage increased further on 03/30/2020 per cardiology to Lasix 80 mg IV twice daily - strict I&O -Follow BMP daily  Major depressive disorder -No chronic home meds -Continue Ativan for now as patient is anxious with processing all the events of the hospitalization  Hypothyroidism - TSH 5.423, FT4 1.02 (normal) and TT3 168 (normal) -Continue Synthroid  Mixed hyperlipidemia due to type 2 diabetes mellitus (HCC) -Continue Lipitor which has been increased to max dose -Outpatient she takes her statin 3 times a week due to some muscle pains; will need to monitor her response to increase  GERD without esophagitis -Continue Protonix  Essential hypertension -Continue losartan and Lopressor   Old records reviewed in assessment of this patient  Antimicrobials: n/a  DVT prophylaxis: heparin injection 5,000 Units Start: 03/28/20 2200   Code Status:   Code Status: Full Code Family Communication: none present  Disposition Plan: Status is: Inpatient  Remains inpatient appropriate because:IV treatments appropriate due to intensity of illness or inability to take PO and Inpatient level of care appropriate due to severity of illness   Dispo: The patient is from: Home  Anticipated d/c is to: Home              Patient currently is not medically stable to d/c.   Difficult to place patient No   Risk of unplanned readmission score: Unplanned Admission- Pilot do not use: 12.53   Objective: Blood pressure 121/66, pulse 70, temperature 98.4 F (36.9 C),  temperature source Oral, resp. rate 18, height 5\' 6"  (1.676 m), weight (!) 164.8 kg, last menstrual period 12/07/2019, SpO2 97 %.  Examination: General appearance: alert, cooperative and no distress Head: Normocephalic, without obvious abnormality, atraumatic Eyes: EOMI Lungs: clear to auscultation bilaterally Heart: regular rate and rhythm and S1, S2 normal Abdomen: obese, soft, NT, BS distant and present Extremities: 3+ LE edema worse in LLE Skin: mobility and turgor normal Neurologic: Grossly normal  Consultants:   Cardiology  Procedures:   LHC, 03/28/20  Data Reviewed: I have personally reviewed following labs and imaging studies Results for orders placed or performed during the hospital encounter of 03/28/20 (from the past 24 hour(s))  Glucose, capillary     Status: Abnormal   Collection Time: 03/29/20  4:15 PM  Result Value Ref Range   Glucose-Capillary 232 (H) 70 - 99 mg/dL  Glucose, capillary     Status: Abnormal   Collection Time: 03/29/20  9:28 PM  Result Value Ref Range   Glucose-Capillary 249 (H) 70 - 99 mg/dL  Glucose, capillary     Status: Abnormal   Collection Time: 03/29/20 11:36 PM  Result Value Ref Range   Glucose-Capillary 229 (H) 70 - 99 mg/dL  CBC     Status: Abnormal   Collection Time: 03/30/20  3:08 AM  Result Value Ref Range   WBC 9.7 4.0 - 10.5 K/uL   RBC 4.70 3.87 - 5.11 MIL/uL   Hemoglobin 11.5 (L) 12.0 - 15.0 g/dL   HCT 05/30/20 16.1 - 09.6 %   MCV 78.9 (L) 80.0 - 100.0 fL   MCH 24.5 (L) 26.0 - 34.0 pg   MCHC 31.0 30.0 - 36.0 g/dL   RDW 04.5 (H) 40.9 - 81.1 %   Platelets 239 150 - 400 K/uL   nRBC 0.0 0.0 - 0.2 %  Basic metabolic panel     Status: Abnormal   Collection Time: 03/30/20  3:08 AM  Result Value Ref Range   Sodium 138 135 - 145 mmol/L   Potassium 3.6 3.5 - 5.1 mmol/L   Chloride 97 (L) 98 - 111 mmol/L   CO2 30 22 - 32 mmol/L   Glucose, Bld 229 (H) 70 - 99 mg/dL   BUN 19 6 - 20 mg/dL   Creatinine, Ser 05/30/20 (H) 0.44 - 1.00 mg/dL    Calcium 9.1 8.9 - 7.82 mg/dL   GFR, Estimated 52 (L) >60 mL/min   Anion gap 11 5 - 15  Magnesium     Status: None   Collection Time: 03/30/20  3:08 AM  Result Value Ref Range   Magnesium 1.8 1.7 - 2.4 mg/dL  Glucose, capillary     Status: Abnormal   Collection Time: 03/30/20  7:38 AM  Result Value Ref Range   Glucose-Capillary 220 (H) 70 - 99 mg/dL  Glucose, capillary     Status: Abnormal   Collection Time: 03/30/20 11:25 AM  Result Value Ref Range   Glucose-Capillary 173 (H) 70 - 99 mg/dL    Recent Results (from the past 240 hour(s))  Resp Panel by RT-PCR (Flu A&B, Covid) Nasopharyngeal Swab     Status: None  Collection Time: 03/28/20  6:07 AM   Specimen: Nasopharyngeal Swab; Nasopharyngeal(NP) swabs in vial transport medium  Result Value Ref Range Status   SARS Coronavirus 2 by RT PCR NEGATIVE NEGATIVE Final    Comment: (NOTE) SARS-CoV-2 target nucleic acids are NOT DETECTED.  The SARS-CoV-2 RNA is generally detectable in upper respiratory specimens during the acute phase of infection. The lowest concentration of SARS-CoV-2 viral copies this assay can detect is 138 copies/mL. A negative result does not preclude SARS-Cov-2 infection and should not be used as the sole basis for treatment or other patient management decisions. A negative result may occur with  improper specimen collection/handling, submission of specimen other than nasopharyngeal swab, presence of viral mutation(s) within the areas targeted by this assay, and inadequate number of viral copies(<138 copies/mL). A negative result must be combined with clinical observations, patient history, and epidemiological information. The expected result is Negative.  Fact Sheet for Patients:  BloggerCourse.com  Fact Sheet for Healthcare Providers:  SeriousBroker.it  This test is no t yet approved or cleared by the [place] FDA and  has been authorized for detection  and/or diagnosis of SARS-CoV-2 by FDA under an Emergency Use Authorization (EUA). This EUA will remain  in effect (meaning this test can be used) for the duration of the COVID-19 declaration under Section 564(b)(1) of the Act, 21 U.S.C.section 360bbb-3(b)(1), unless the authorization is terminated  or revoked sooner.       Influenza A by PCR NEGATIVE NEGATIVE Final   Influenza B by PCR NEGATIVE NEGATIVE Final    Comment: (NOTE) The Xpert Xpress SARS-CoV-2/FLU/RSV plus assay is intended as an aid in the diagnosis of influenza from Nasopharyngeal swab specimens and should not be used as a sole basis for treatment. Nasal washings and aspirates are unacceptable for Xpert Xpress SARS-CoV-2/FLU/RSV testing.  Fact Sheet for Patients: BloggerCourse.com  Fact Sheet for Healthcare Providers: SeriousBroker.it  This test is not yet approved or cleared by the [place] FDA and has been authorized for detection and/or diagnosis of SARS-CoV-2 by FDA under an Emergency Use Authorization (EUA). This EUA will remain in effect (meaning this test can be used) for the duration of the COVID-19 declaration under Section 564(b)(1) of the Act, 21 U.S.C. section 360bbb-3(b)(1), unless the authorization is terminated or revoked.  Performed at [facility], [address], [place], [place] [address]      Radiology Studies: CARDIAC CATHETERIZATION  Result Date: 03/28/2020  Dist RCA lesion is 25% stenosed.  Mid LAD-1 lesion is 95% stenosed.  A drug-eluting stent was successfully placed using a STENT RESOLUTE ONYX [id number]. The proximal stent was postdilated with a 3.75 mm Sawyer baloon.  Post intervention, there is a 0% residual stenosis.  Mid LAD-2 lesion is 50% stenosed.  LV end diastolic pressure is moderately elevated. LVEDP 27 mm Hg.  There is no aortic valve stenosis.  Continue dual antiplatelet therapy for 12 months. Consider clopidogrel  monotherapy after 12 months, along with aggressive secondary prevention.  Increase statin to high potency.  Continue with plans for diuresis.  D/w Dr. Jens Som.   DG Chest 2 View  Final Result      Scheduled Meds: . aspirin  81 mg Oral Daily  . atorvastatin  80 mg Oral Daily  . furosemide  80 mg Intravenous BID  . heparin  5,000 Units Subcutaneous Q8H  . influenza vac split quadrivalent PF  0.5 mL Intramuscular Tomorrow-1000  . insulin aspart  0-20 Units Subcutaneous TID WC  .  insulin aspart  0-5 Units Subcutaneous QHS  . insulin aspart protamine- aspart  100 Units Subcutaneous BID WC  . levothyroxine  50 mcg Oral Q0600  . losartan  50 mg Oral Daily  . metoprolol tartrate  25 mg Oral BID  . nystatin   Topical TID  . pantoprazole  40 mg Oral Daily  . sodium chloride flush  3 mL Intravenous Q12H  . ticagrelor  90 mg Oral BID   PRN Meds: sodium chloride, acetaminophen, LORazepam, ondansetron (ZOFRAN) IV, sodium chloride flush Continuous Infusions: . sodium chloride       LOS: 1 day  Time spent: Greater than 50% of the 35 minute visit was spent in counseling/coordination of care for the patient as laid out in the A&P.   Lewie Chamber, MD [facility] 03/30/2020, 1:24 PM

## 2020-03-30 NOTE — Progress Notes (Signed)
Returned to review ed with pt. She is sleeping sitting up in recliner, on O2. She is very tired and minimally conversant due to lack of sleep for days per pt. She declines questions regarding education we discussed yesterday. She ambulates to BR, which is her baseline.  Will f/u as low priority. [id number] Ethelda Chick CES, ACSM 10:46 AM 03/30/2020

## 2020-03-30 NOTE — Plan of Care (Signed)
  Problem: Activity: Goal: Risk for activity intolerance will decrease Outcome: Progressing   Problem: Nutrition: Goal: Adequate nutrition will be maintained Outcome: Progressing   

## 2020-03-30 NOTE — Progress Notes (Signed)
Progress Note  Patient Name: Caitlin MustardCrystal [name] Date of Encounter: 03/30/2020  [facility] Cardiologist: Olga MillersBrian Aaron Boeh, MD   Subjective   Pt denies CP; remains dyspneic  Inpatient Medications    Scheduled Meds: . aspirin  81 mg Oral Daily  . atorvastatin  80 mg Oral Daily  . furosemide  40 mg Intravenous BID  . heparin  5,000 Units Subcutaneous Q8H  . influenza vac split quadrivalent PF  0.5 mL Intramuscular Tomorrow-1000  . insulin aspart  0-20 Units Subcutaneous TID WC  . insulin aspart  0-5 Units Subcutaneous QHS  . insulin aspart protamine- aspart  100 Units Subcutaneous BID WC  . levothyroxine  50 mcg Oral Q0600  . losartan  50 mg Oral Daily  . metoprolol tartrate  25 mg Oral BID  . pantoprazole  40 mg Oral Daily  . sodium chloride flush  3 mL Intravenous Q12H  . ticagrelor  90 mg Oral BID   Continuous Infusions: . sodium chloride     PRN Meds: sodium chloride, acetaminophen, LORazepam, ondansetron (ZOFRAN) IV, sodium chloride flush   Vital Signs    Vitals:   03/29/20 1618 03/29/20 2129 03/29/20 2342 03/30/20 0518  BP: 133/71 (!) 143/76 (!) 145/70 137/73  Pulse: 73 78 74 75  Resp: 18 19 19 17   Temp: (!) 97.5 F (36.4 C) 98.4 F (36.9 C) 98.4 F (36.9 C) 98.3 F (36.8 C)  TempSrc: Oral Oral Oral Oral  SpO2: 96% 95% 99% 94%  Weight:      Height:        Intake/Output Summary (Last 24 hours) at 03/30/2020 0818 Last data filed at 03/30/2020 0030 Gross per 24 hour  Intake 230 ml  Output 2700 ml  Net -2470 ml   Last 3 Weights 03/29/2020 03/28/2020 03/28/2020  Weight (lbs) 363 lb 5.1 oz 380 lb 355 lb  Weight (kg) 164.8 kg 172.367 kg 161.027 kg      Telemetry    Sinus - Personally Reviewed   Physical Exam   GEN: WD obese Neck: supple Cardiac: RRR, no murmur Respiratory: CTA GI: Soft, NT/ND MS: 1+ edema Neuro:  Grossly intact Psych: Normal affect   Labs    High Sensitivity Troponin:   Recent Labs  Lab 03/28/20 0347 03/28/20 0606  03/28/20 1433 03/28/20 2016 03/29/20 0036  TROPONINIHS 514* 539* 1,302* 1,643* 2,226*      Chemistry Recent Labs  Lab 03/28/20 0347 03/28/20 2016 03/29/20 0036 03/30/20 0308  NA 139  --  138 138  K 4.4  --  4.3 3.6  CL 99  --  97* 97*  CO2 29  --  31 30  GLUCOSE 279*  --  298* 229*  BUN 19  --  20 19  CREATININE 1.17* 1.13* 1.15* 1.27*  CALCIUM 8.6*  --  9.1 9.1  GFRNONAA 58* >60 59* 52*  ANIONGAP 11  --  10 11     Hematology Recent Labs  Lab 03/28/20 2016 03/29/20 0036 03/30/20 0308  WBC 9.4 9.2 9.7  RBC 4.62 4.76 4.70  HGB 11.1* 11.4* 11.5*  HCT 37.5 38.5 37.1  MCV 81.2 80.9 78.9*  MCH 24.0* 23.9* 24.5*  MCHC 29.6* 29.6* 31.0  RDW 17.3* 17.1* 17.3*  PLT 234 257 239    BNP Recent Labs  Lab 03/28/20 0506  BNP 92.3     Radiology    CARDIAC CATHETERIZATION  Result Date: 03/28/2020  Dist RCA lesion is 25% stenosed.  Mid LAD-1 lesion is 95% stenosed.  A  drug-eluting stent was successfully placed using a STENT RESOLUTE ONYX [id number]. The proximal stent was postdilated with a 3.75 mm Belen baloon.  Post intervention, there is a 0% residual stenosis.  Mid LAD-2 lesion is 50% stenosed.  LV end diastolic pressure is moderately elevated. LVEDP 27 mm Hg.  There is no aortic valve stenosis.  Continue dual antiplatelet therapy for 12 months. Consider clopidogrel monotherapy after 12 months, along with aggressive secondary prevention.  Increase statin to high potency.  Continue with plans for diuresis.  D/w Dr. Jens Som.   ECHOCARDIOGRAM COMPLETE  Result Date: 03/28/2020    ECHOCARDIOGRAM REPORT   Patient Name:   [name] Date of Exam: 03/28/2020 Medical Rec #:  [id number]       Height:       66.0 in Accession #:    [id number]      Weight:       355.0 lb Date of Birth:  Dec 07, 1970        BSA:          2.552 m Patient Age:    49 years        BP:           107/60 mmHg Patient Gender: F               HR:           88 bpm. Exam Location:  Inpatient Procedure: 2D Echo,  Cardiac Doppler, Color Doppler and Intracardiac            Opacification Agent Indications:    I50.9* Heart failure (unspecified)  History:        Patient has no prior history of Echocardiogram examinations.                 Risk Factors:Morbid obesity. Fluid overload.  Sonographer:    Roosvelt Maser Referring Phys: [id number] Deno Lunger SHALHOUB IMPRESSIONS  1. Overall poor image quality even with definity Septal and apical hypokinesis . Left ventricular ejection fraction, by estimation, is 50 to 55%. The left ventricle has low normal function. The left ventricle has no regional wall motion abnormalities. Left ventricular diastolic parameters were normal.  2. Right ventricular systolic function is normal. The right ventricular size is normal.  3. Left atrial size was mildly dilated.  4. The mitral valve is normal in structure. No evidence of mitral valve regurgitation. No evidence of mitral stenosis.  5. The aortic valve was not well visualized. Aortic valve regurgitation is not visualized. No aortic stenosis is present.  6. The inferior vena cava is normal in size with greater than 50% respiratory variability, suggesting right atrial pressure of 3 mmHg. FINDINGS  Left Ventricle: Overall poor image quality even with definity Septal and apical hypokinesis. Left ventricular ejection fraction, by estimation, is 50 to 55%. The left ventricle has low normal function. The left ventricle has no regional wall motion abnormalities. Definity contrast agent was given IV to delineate the left ventricular endocardial borders. The left ventricular internal cavity size was normal in size. There is no left ventricular hypertrophy. Left ventricular diastolic parameters were normal. Right Ventricle: The right ventricular size is normal. No increase in right ventricular wall thickness. Right ventricular systolic function is normal. Left Atrium: Left atrial size was mildly dilated. Right Atrium: Right atrial size was normal in size.  Pericardium: There is no evidence of pericardial effusion. Mitral Valve: The mitral valve is normal in structure. No evidence of mitral valve regurgitation. No evidence of mitral valve stenosis.  Tricuspid Valve: The tricuspid valve is normal in structure. Tricuspid valve regurgitation is not demonstrated. No evidence of tricuspid stenosis. Aortic Valve: The aortic valve was not well visualized. Aortic valve regurgitation is not visualized. No aortic stenosis is present. Aortic valve mean gradient measures 4.0 mmHg. Aortic valve peak gradient measures 7.6 mmHg. Aortic valve area, by VTI measures 1.94 cm. Pulmonic Valve: The pulmonic valve was normal in structure. Pulmonic valve regurgitation is not visualized. No evidence of pulmonic stenosis. Aorta: The aortic root is normal in size and structure. Venous: The inferior vena cava is normal in size with greater than 50% respiratory variability, suggesting right atrial pressure of 3 mmHg. IAS/Shunts: No atrial level shunt detected by color flow Doppler.  LEFT VENTRICLE PLAX 2D LVIDd:         5.80 cm  Diastology LVIDs:         3.70 cm  LV e' medial:    7.51 cm/s LV PW:         1.00 cm  LV E/e' medial:  12.4 LV IVS:        1.00 cm  LV e' lateral:   8.49 cm/s LVOT diam:     1.80 cm  LV E/e' lateral: 11.0 LV SV:         50 LV SV Index:   20 LVOT Area:     2.54 cm  LEFT ATRIUM            Index LA diam:      4.20 cm  1.65 cm/m LA Vol (A4C): 122.0 ml 47.80 ml/m  AORTIC VALVE AV Area (Vmax):    1.84 cm AV Area (Vmean):   2.03 cm AV Area (VTI):     1.94 cm AV Vmax:           138.00 cm/s AV Vmean:          87.700 cm/s AV VTI:            0.259 m AV Peak Grad:      7.6 mmHg AV Mean Grad:      4.0 mmHg LVOT Vmax:         100.00 cm/s LVOT Vmean:        70.100 cm/s LVOT VTI:          0.197 m LVOT/AV VTI ratio: 0.76  AORTA Ao Root diam: 3.20 cm Ao Asc diam:  3.60 cm MITRAL VALVE MV Area (PHT): 7.66 cm    SHUNTS MV Decel Time: 99 msec     Systemic VTI:  0.20 m MV E velocity:  93.30 cm/s  Systemic Diam: 1.80 cm MV A velocity: 86.10 cm/s MV E/A ratio:  1.08 Charlton Haws MD Electronically signed by Charlton Haws MD Signature Date/Time: 03/28/2020/9:24:29 AM    Final     Patient Profile     50 y.o. female with past medical history of diabetes mellitus, hypertension, hyperlipidemia, obstructive sleep apnea, morbid obesity admitted with acute combined systolic/diastolic congestive heart failure and non-ST elevation myocardial infarction.  Echocardiogram showed ejection fraction 50 to 55% with apical hypokinesis, mild left atrial enlargement.  Developed recurrent chest pain and had urgent cardiac catheterization which revealed 95% mid LAD which was treated with a drug-eluting stent.  Left ventricular end-diastolic pressure twenty-seven.  Assessment & Plan    1 non-ST elevation myocardial infarction-plan to continue aspirin, Brilinta, metoprolol and statin.  2 acute combined systolic/diastolic congestive heart failure-LVEDP 27 at time of cath; I/O -2350.  Remains volume overloaded.  Increase lasix  to 80 mg BID.  Follow renal function closely.  3 hyperlipidemia-continue statin.  4 hypertension-blood pressure controlled.  Continue present medications.  5 morbid obesity-needs weight loss.  For questions or updates, please contact [facility] Please consult [email or web address] for contact info under        Signed, [name], MD  03/30/2020, 8:18 AM

## 2020-03-31 DIAGNOSIS — I2510 Atherosclerotic heart disease of native coronary artery without angina pectoris: Secondary | ICD-10-CM

## 2020-03-31 LAB — GLUCOSE, CAPILLARY
Glucose-Capillary: 251 mg/dL — ABNORMAL HIGH (ref 70–99)
Glucose-Capillary: 191 mg/dL — ABNORMAL HIGH (ref 70–99)
Glucose-Capillary: 131 mg/dL — ABNORMAL HIGH (ref 70–99)
Glucose-Capillary: 108 mg/dL — ABNORMAL HIGH (ref 70–99)
Glucose-Capillary: 77 mg/dL (ref 70–99)

## 2020-03-31 LAB — CBC
HCT: 40.4 % (ref 36.0–46.0)
Hemoglobin: 11.9 g/dL — ABNORMAL LOW (ref 12.0–15.0)
MCH: 23.8 pg — ABNORMAL LOW (ref 26.0–34.0)
MCHC: 29.5 g/dL — ABNORMAL LOW (ref 30.0–36.0)
MCV: 80.6 fL (ref 80.0–100.0)
Platelets: 241 10*3/uL (ref 150–400)
RBC: 5.01 MIL/uL (ref 3.87–5.11)
RDW: 17.2 % — ABNORMAL HIGH (ref 11.5–15.5)
WBC: 10.9 10*3/uL — ABNORMAL HIGH (ref 4.0–10.5)
nRBC: 0 % (ref 0.0–0.2)

## 2020-03-31 LAB — BASIC METABOLIC PANEL
Anion gap: 10 (ref 5–15)
BUN: 23 mg/dL — ABNORMAL HIGH (ref 6–20)
CO2: 30 mmol/L (ref 22–32)
Calcium: 9.1 mg/dL (ref 8.9–10.3)
Chloride: 97 mmol/L — ABNORMAL LOW (ref 98–111)
Creatinine, Ser: 1.26 mg/dL — ABNORMAL HIGH (ref 0.44–1.00)
GFR, Estimated: 53 mL/min — ABNORMAL LOW (ref >60)
Glucose, Bld: 252 mg/dL — ABNORMAL HIGH (ref 70–99)
Potassium: 4.1 mmol/L (ref 3.5–5.1)
Sodium: 137 mmol/L (ref 135–145)

## 2020-03-31 LAB — MAGNESIUM: Magnesium: 2 mg/dL (ref 1.7–2.4)

## 2020-03-31 MED ORDER — SPIRONOLACTONE 25 MG PO TABS
25.0000 mg | ORAL_TABLET | Freq: Every day | ORAL | Status: DC
  Administered 2020-03-31 – 2020-04-03 (×4): 25 mg via ORAL
  Filled 2020-03-31 (×4): qty 1

## 2020-03-31 NOTE — Progress Notes (Signed)
Heart Failure Nurse Navigator Progress Note Late entry  PCP: Patient, No Pcp Per PCP-Cardiologist: Jens Som, B., MD Admission Diagnosis: NSTEMI Admitted from: Home with significant other  Presentation:   [name] presented with BLE edema, SOB w exertion. Pt was drowsy at time of interview, bedside RN had given ativan per order, pt was sitting up in recliner after nap. Upon screening assessment interview pt states she is not able to perform ADLs, boyfriend has to help her shower and dress. Pt able to get to toilet, but only able to wipe buttock. States she often has yeast infections. Boyfriend works 3-12 hour shifts, but does help her get food ready before leaving for the day. Pt does not drive, bf has vehicle and will drive pt to appts. Pt states recently moved from rental house as landlord needed house back into an apartment which rent is $250 more ($1000/mo) which has caused some stain on finances.   ECHO/ LVEF: 50-55%  Clinical Course:  Past Medical History:  Diagnosis Date  . DDD (degenerative disc disease), lumbar   . Diabetes mellitus without complication (HCC)   . Hypertension   . Lumbar herniated disc      Social History   Socioeconomic History  . Marital status: Single    Spouse name: Not on file  . Number of children: Not on file  . Years of education: Not on file  . Highest education level: Not on file  Occupational History  . Not on file  Tobacco Use  . Smoking status: Never Smoker  . Smokeless tobacco: Never Used  Vaping Use  . Vaping Use: Never used  Substance and Sexual Activity  . Alcohol use: No  . Drug use: No  . Sexual activity: Not on file  Other Topics Concern  . Not on file  Social History Narrative  . Not on file   Social Determinants of Health   Financial Resource Strain: Medium Risk  . Difficulty of Paying Living Expenses: Somewhat hard  Food Insecurity: No Food Insecurity  . Worried About Programme researcher, broadcasting/film/video in the Last Year: Never  true  . Ran Out of Food in the Last Year: Never true  Transportation Needs: No Transportation Needs  . Lack of Transportation (Medical): No  . Lack of Transportation (Non-Medical): No  Physical Activity: Inactive  . Days of Exercise per Week: 0 days  . Minutes of Exercise per Session: 0 min  Stress: Not on file  Social Connections: Not on file    High Risk Criteria for Readmission and/or Poor Patient Outcomes:  Heart failure hospital admissions (last 6 months): 1  No Show rate: N/A  Difficult social situation: yes  Demonstrates medication adherence: unsure  Primary Language: English  Literacy level: able to read/write and comprehend  Barriers of Care:   -knowledge -financial strain -med cost  Considerations/Referrals:   Referral made to Heart Failure Pharmacist Stewardship: yes, appreciated. Will see at [facility] visit Referral made to Heart & Vascular TOC clinic: yes, pending DC. P still on IVP lasix 80mg  BID.   Items for Follow-up on DC/TOC: -PCP -financial  -medication cost concerns  , RN, BSN Heart Failure Nurse Navigator [phone]

## 2020-03-31 NOTE — Care Management (Signed)
03-31-20 1151 Benefits check submitted for Brilinta. Case Manager will follow for cost. Graves-Bigelow, Lamar Laundry, RN,BSN Case Manager

## 2020-03-31 NOTE — Progress Notes (Signed)
Progress Note  Patient Name: [name] Date of Encounter: 03/31/2020  [facility] Cardiologist: [name], MD   Subjective   No CP; remains dyspneic  Inpatient Medications    Scheduled Meds: . aspirin  81 mg Oral Daily  . atorvastatin  80 mg Oral Daily  . furosemide  80 mg Intravenous BID  . heparin  5,000 Units Subcutaneous Q8H  . influenza vac split quadrivalent PF  0.5 mL Intramuscular Tomorrow-1000  . insulin aspart  0-20 Units Subcutaneous TID WC  . insulin aspart  0-5 Units Subcutaneous QHS  . insulin aspart protamine- aspart  100 Units Subcutaneous BID WC  . levothyroxine  50 mcg Oral Q0600  . losartan  50 mg Oral Daily  . mouth rinse  15 mL Mouth Rinse BID  . metoprolol tartrate  25 mg Oral BID  . nystatin   Topical TID  . pantoprazole  40 mg Oral Daily  . sodium chloride flush  3 mL Intravenous Q12H  . ticagrelor  90 mg Oral BID   Continuous Infusions: . sodium chloride     PRN Meds: sodium chloride, acetaminophen, LORazepam, ondansetron (ZOFRAN) IV, sodium chloride flush   Vital Signs    Vitals:   03/30/20 2121 03/31/20 0022 03/31/20 0439 03/31/20 0606  BP: 125/66 (!) 92/55 (!) 100/48   Pulse: 82 65 73   Resp: (!) 22 18 18    Temp: 98.5 F (36.9 C) 98.7 F (37.1 C) 98.7 F (37.1 C)   TempSrc: Oral Oral Oral   SpO2: 97% 100% 95%   Weight:    (!) 166.1 kg  Height:        Intake/Output Summary (Last 24 hours) at 03/31/2020 0758 Last data filed at 03/30/2020 2139 Gross per 24 hour  Intake 597 ml  Output 1650 ml  Net -1053 ml   Last 3 Weights 03/31/2020 03/29/2020 03/28/2020  Weight (lbs) 366 lb 3.2 oz 363 lb 5.1 oz 380 lb  Weight (kg) 166.107 kg 164.8 kg 172.367 kg      Telemetry    Sinus - Personally Reviewed   Physical Exam   GEN: WD morbidly obese Neck: JVP difficult to assess Cardiac: RRR Respiratory: CTA; no wheeze GI: Soft, NT/ND, no masses MS: 1+ edema Neuro:  No focal findings Psych: Normal affect   Labs    High  Sensitivity Troponin:   Recent Labs  Lab 03/28/20 0347 03/28/20 0606 03/28/20 1433 03/28/20 2016 03/29/20 0036  TROPONINIHS 514* 539* 1,302* 1,643* 2,226*      Chemistry Recent Labs  Lab 03/29/20 0036 03/30/20 0308 03/31/20 0314  NA 138 138 137  K 4.3 3.6 4.1  CL 97* 97* 97*  CO2 31 30 30   GLUCOSE 298* 229* 252*  BUN 20 19 23*  CREATININE 1.15* 1.27* 1.26*  CALCIUM 9.1 9.1 9.1  GFRNONAA 59* 52* 53*  ANIONGAP 10 11 10      Hematology Recent Labs  Lab 03/29/20 0036 03/30/20 0308 03/31/20 0314  WBC 9.2 9.7 10.9*  RBC 4.76 4.70 5.01  HGB 11.4* 11.5* 11.9*  HCT 38.5 37.1 40.4  MCV 80.9 78.9* 80.6  MCH 23.9* 24.5* 23.8*  MCHC 29.6* 31.0 29.5*  RDW 17.1* 17.3* 17.2*  PLT 257 239 241    BNP Recent Labs  Lab 03/28/20 0506  BNP 92.3     Patient Profile     50 y.o. female with past medical history of diabetes mellitus, hypertension, hyperlipidemia, obstructive sleep apnea, morbid obesity admitted with acute combined systolic/diastolic congestive heart  failure and non-ST elevation myocardial infarction.  Echocardiogram showed ejection fraction 50 to 55% with apical hypokinesis, mild left atrial enlargement.  Developed recurrent chest pain and had urgent cardiac catheterization which revealed 95% mid LAD which was treated with a drug-eluting stent.  Left ventricular end-diastolic pressure twenty-seven.  Assessment & Plan    1 non-ST elevation myocardial infarction-status post PCI of LAD. Plan to continue aspirin, Brilinta, metoprolol and statin.  2 acute combined systolic/diastolic congestive heart failure-. LVEDP 27 at time of cath; I/O -1053.  Remains volume overloaded but slowly improving. Continue Lasix at present dose. Add spironolactone 25 mg daily. Follow renal function.  3 hyperlipidemia-continue statin.  4 hypertension-blood pressure trending down. Discontinue losartan and follow.  5 morbid obesity-needs weight loss. Will need sleep study following  discharge.  For questions or updates, please contact [facility] Please consult [email or web address] for contact info under        Signed, [name], MD  03/31/2020, 7:58 AM

## 2020-03-31 NOTE — Progress Notes (Signed)
Patient Advocate Encounter  Notified by Dr. Sabino Gasser that Brilinta copay from [facility] CM/SW was found to be $412.72 per month.  This patient has Pharmacist, community and it does not appear there is any deductible that has to be met before this copay would lower, therefore, all 30-day fills for Brilinta would be $412.72 per month.   There is a Brilinta monthly copay card that this patient could be eligible for since she has a commercial plan and would lower her monthly copay as low as $5 per month. However, the maximum benefit per 30-day supply would be $200. Since this patient's monthly copay is >$400, her monthly copay would still be >$200 per month for Brilinta.   If Brilinta is requested to be used for the first 30 days with the plan to switch to clopidogrel for the remainder of therapy, we can utilize the free 30-day Brilinta card and then her monthly copay for clopidogrel would also be $0 per month with her commercial insurance.   Will message primary team + cardiology to relay this info to determine next steps for her antiplatelet regimen.  Kerby Nora, PharmD, BCPS Heart Failure Stewardship Pharmacist Phone [phone]  Please check [email or web address] for unit-specific pharmacist phone numbers

## 2020-03-31 NOTE — Progress Notes (Signed)
PROGRESS NOTE    [name]   UJW:[id number]  DOB: 09/22/1970  DOA: 03/28/2020     2  PCP: Patient, No Pcp Per  CC: CP, SOB  Hospital Course: [name] is a 50 yo female with PMH morbid obesity, IDDM, HTN, hypothyroidism, depression, GERD who presented to the hospital with worsening shortness of breath and chest pain.  She described worsening orthopnea and PND.  She underwent further work-up and was found to have elevated troponins and wall motion abnormalities on echo.  She was classified as NSTEMI and taken urgently to the Cath Lab on 03/28/2020.  She underwent placement of 1 DES to the mid LAD and had improvement post procedure in her chest pain. She still had ongoing shortness of breath which was considered being contributed from underlying low normal LV function and was started on diuresis with IV Lasix.   Interval History:  No events overnight.  Sitting up in chair again this morning still complaining of feeling short of breath but overall she does endorse that it has been slowly improving during hospitalization.  Still very edematous in her legs and states that she does put them up when in bed.  Still on oxygen.  She did a walk test and dropped down to 88% on room air.  ROS: Constitutional: negative for chills and fevers, Respiratory: positive for dyspnea on exertion, Cardiovascular: positive for orthopnea and paroxysmal nocturnal dyspnea and Gastrointestinal: negative for abdominal pain  Assessment & Plan: * NSTEMI (non-ST elevated myocardial infarction) (HCC) - trops not fully trended to peak but escalated up to 2226. - now s/p LHC on 3/1 and underwent 1 DES to mid LAD (95% stenosis) - continue asa/brilinta. Copay for Brilinta is $412 (need to clarify if TOC will be supplying more affordably vs anticipate need to transition to plavix) - CP free when seen this am; still having some anxiety issues 2/2 the situation which is not unexpected and should improve as she processes  further   CAD (coronary artery disease) - s/p DES to mid LAD on 03/28/20 - continue asa/bilinta for now - continue lipitor, lopressor (agree with need for eventual change to Toprol), ARB has been d/c for hypoTN per cardiology; spironolactone started on 3/4 - see NSTEMI  Uncontrolled type 2 diabetes mellitus with hyperglycemia, with long-term current use of insulin (HCC) - A1c 7.7% on 03/28/20; prior A1c noted 11.7% on 12/29/19 - on very large doses of 70/30 outpatient; raises some concern for unintentional administration in scar tissues, etc - will convert from Lantus back to 70/30 to find steady dose: currently stable with 70/30 100 units BID  Acute on chronic congestive heart failure (HCC) - elevated LVEDP during cath, 27 mmHg - echo on 3/1 showed EF 50-55% EF; read as low/normal function per cardiology with WMAs  - continue IV lasix; dosage increased further on 03/30/2020 per cardiology to Lasix 80 mg IV twice daily - strict I&O - meds now: lasix and spironolactone (latter added 3/4) -Follow BMP daily  Major depressive disorder -No chronic home meds -Continue Ativan for now as patient is anxious with processing all the events of the hospitalization  Hypothyroidism - TSH 5.423, FT4 1.02 (normal) and TT3 168 (normal) -Continue Synthroid  Mixed hyperlipidemia due to type 2 diabetes mellitus (HCC) -Continue Lipitor which has been increased to max dose -Outpatient she takes her statin 3 times a week due to some muscle pains; will need to monitor her response to increase  GERD without esophagitis -Continue Protonix  Essential  hypertension - BP soft am of 3/4 - d/c losartan per cardiology - continue lopressor; spironolactone also added   Old records reviewed in assessment of this patient  Antimicrobials: n/a  DVT prophylaxis: heparin injection 5,000 Units Start: 03/28/20 2200   Code Status:   Code Status: Full Code Family Communication: none present  Disposition  Plan: Status is: Inpatient  Remains inpatient appropriate because:IV treatments appropriate due to intensity of illness or inability to take PO and Inpatient level of care appropriate due to severity of illness   Dispo: The patient is from: Home              Anticipated d/c is to: Home              Patient currently is not medically stable to d/c.   Difficult to place patient No   Risk of unplanned readmission score: Unplanned Admission- Pilot do not use: 14.55   Objective: Blood pressure 125/67, pulse 74, temperature 98.9 F (37.2 C), temperature source Oral, resp. rate 18, height 5\' 6"  (1.676 m), weight (!) 166.1 kg, last menstrual period 12/07/2019, SpO2 96 %.  Examination: General appearance: alert, cooperative and no distress Head: Normocephalic, without obvious abnormality, atraumatic Eyes: EOMI Lungs: clear to auscultation bilaterally Heart: regular rate and rhythm and S1, S2 normal Abdomen: obese, soft, NT, BS distant and present Extremities: 3+ LE edema worse in LLE Skin: mobility and turgor normal Neurologic: Grossly normal  Consultants:   Cardiology  Procedures:   LHC, 03/28/20  Data Reviewed: I have personally reviewed following labs and imaging studies Results for orders placed or performed during the hospital encounter of 03/28/20 (from the past 24 hour(s))  Glucose, capillary     Status: Abnormal   Collection Time: 03/30/20  4:10 PM  Result Value Ref Range   Glucose-Capillary 119 (H) 70 - 99 mg/dL  Glucose, capillary     Status: Abnormal   Collection Time: 03/30/20  9:27 PM  Result Value Ref Range   Glucose-Capillary 177 (H) 70 - 99 mg/dL  CBC     Status: Abnormal   Collection Time: 03/31/20  3:14 AM  Result Value Ref Range   WBC 10.9 (H) 4.0 - 10.5 K/uL   RBC 5.01 3.87 - 5.11 MIL/uL   Hemoglobin 11.9 (L) 12.0 - 15.0 g/dL   HCT 05/31/20 90.2 - 40.9 %   MCV 80.6 80.0 - 100.0 fL   MCH 23.8 (L) 26.0 - 34.0 pg   MCHC 29.5 (L) 30.0 - 36.0 g/dL   RDW 73.5  (H) 32.9 - 15.5 %   Platelets 241 150 - 400 K/uL   nRBC 0.0 0.0 - 0.2 %  Basic metabolic panel     Status: Abnormal   Collection Time: 03/31/20  3:14 AM  Result Value Ref Range   Sodium 137 135 - 145 mmol/L   Potassium 4.1 3.5 - 5.1 mmol/L   Chloride 97 (L) 98 - 111 mmol/L   CO2 30 22 - 32 mmol/L   Glucose, Bld 252 (H) 70 - 99 mg/dL   BUN 23 (H) 6 - 20 mg/dL   Creatinine, Ser 05/31/20 (H) 0.44 - 1.00 mg/dL   Calcium 9.1 8.9 - 2.68 mg/dL   GFR, Estimated 53 (L) >60 mL/min   Anion gap 10 5 - 15  Magnesium     Status: None   Collection Time: 03/31/20  3:14 AM  Result Value Ref Range   Magnesium 2.0 1.7 - 2.4 mg/dL  Glucose, capillary  Status: Abnormal   Collection Time: 03/31/20  8:22 AM  Result Value Ref Range   Glucose-Capillary 251 (H) 70 - 99 mg/dL  Glucose, capillary     Status: Abnormal   Collection Time: 03/31/20 11:48 AM  Result Value Ref Range   Glucose-Capillary 191 (H) 70 - 99 mg/dL    Recent Results (from the past 240 hour(s))  Resp Panel by RT-PCR (Flu A&B, Covid) Nasopharyngeal Swab     Status: None   Collection Time: 03/28/20  6:07 AM   Specimen: Nasopharyngeal Swab; Nasopharyngeal(NP) swabs in vial transport medium  Result Value Ref Range Status   SARS Coronavirus 2 by RT PCR NEGATIVE NEGATIVE Final    Comment: (NOTE) SARS-CoV-2 target nucleic acids are NOT DETECTED.  The SARS-CoV-2 RNA is generally detectable in upper respiratory specimens during the acute phase of infection. The lowest concentration of SARS-CoV-2 viral copies this assay can detect is 138 copies/mL. A negative result does not preclude SARS-Cov-2 infection and should not be used as the sole basis for treatment or other patient management decisions. A negative result may occur with  improper specimen collection/handling, submission of specimen other than nasopharyngeal swab, presence of viral mutation(s) within the areas targeted by this assay, and inadequate number of viral copies(<138  copies/mL). A negative result must be combined with clinical observations, patient history, and epidemiological information. The expected result is Negative.  Fact Sheet for Patients:  BloggerCourse.com  Fact Sheet for Healthcare Providers:  SeriousBroker.it  This test is no t yet approved or cleared by the [place] FDA and  has been authorized for detection and/or diagnosis of SARS-CoV-2 by FDA under an Emergency Use Authorization (EUA). This EUA will remain  in effect (meaning this test can be used) for the duration of the COVID-19 declaration under Section 564(b)(1) of the Act, 21 U.S.C.section 360bbb-3(b)(1), unless the authorization is terminated  or revoked sooner.       Influenza A by PCR NEGATIVE NEGATIVE Final   Influenza B by PCR NEGATIVE NEGATIVE Final    Comment: (NOTE) The Xpert Xpress SARS-CoV-2/FLU/RSV plus assay is intended as an aid in the diagnosis of influenza from Nasopharyngeal swab specimens and should not be used as a sole basis for treatment. Nasal washings and aspirates are unacceptable for Xpert Xpress SARS-CoV-2/FLU/RSV testing.  Fact Sheet for Patients: BloggerCourse.com  Fact Sheet for Healthcare Providers: SeriousBroker.it  This test is not yet approved or cleared by the [place] FDA and has been authorized for detection and/or diagnosis of SARS-CoV-2 by FDA under an Emergency Use Authorization (EUA). This EUA will remain in effect (meaning this test can be used) for the duration of the COVID-19 declaration under Section 564(b)(1) of the Act, 21 U.S.C. section 360bbb-3(b)(1), unless the authorization is terminated or revoked.  Performed at [facility], [address] [place], [place] [address]      Radiology Studies: No results found. DG Chest 2 View  Final Result      Scheduled Meds: . aspirin  81 mg Oral Daily  .  atorvastatin  80 mg Oral Daily  . furosemide  80 mg Intravenous BID  . heparin  5,000 Units Subcutaneous Q8H  . influenza vac split quadrivalent PF  0.5 mL Intramuscular Tomorrow-1000  . insulin aspart  0-20 Units Subcutaneous TID WC  . insulin aspart  0-5 Units Subcutaneous QHS  . insulin aspart protamine- aspart  100 Units Subcutaneous BID WC  . levothyroxine  50 mcg Oral Q0600  . mouth rinse  15 mL Mouth Rinse BID  . metoprolol tartrate  25 mg Oral BID  . nystatin   Topical TID  . pantoprazole  40 mg Oral Daily  . sodium chloride flush  3 mL Intravenous Q12H  . spironolactone  25 mg Oral Daily  . ticagrelor  90 mg Oral BID   PRN Meds: sodium chloride, acetaminophen, LORazepam, ondansetron (ZOFRAN) IV, sodium chloride flush Continuous Infusions: . sodium chloride       LOS: 2 days  Time spent: Greater than 50% of the 35 minute visit was spent in counseling/coordination of care for the patient as laid out in the A&P.   Lewie Chamber, MD [facility] 03/31/2020, 3:40 PM

## 2020-03-31 NOTE — Assessment & Plan Note (Addendum)
-   s/p DES to mid LAD on 03/28/20 - continue asa/bilinta for 1 month then changing to asa/plavix with plavix load (scripts given at d/c) - patient gets myalgia with statins (was on low dose lipitor PTA three times weekly), she developed myalgia with daily lipitor 80mg  inpatient; at discharge prescribed Crestor 20 mg three times weekly and instructed to increase to daily as she can tolerate - ARB discontinued due to hypotension - see NSTEMI - transitioned lopressor to Toprol at discharge

## 2020-03-31 NOTE — Progress Notes (Signed)
Heart Failure Stewardship Pharmacist Progress Note   PCP: Patient, No Pcp Per PCP-Cardiologist: [name], MD    HPI:  50 YO female with PMH of T2DM, HTN, HLD, OSA, and obesity. She presented to the ED on 03/28/20 for worsening SOB, orthopnea and chest pain. Cardiac workup revealed NSTEMI. Developed recurrent chest pain and was urgently taken to cath lab on 03/28/20 and underwent stent placement to the mid LAD. Patient reports symptom relief post procedure. ECHO completed on 03/28/20 with LVEF of 50-55%.   Current HF Medications: Furosemide 80 mg IV BID Metoprolol tartrate 25 mg BID Spironolactone 25 mg daily   Prior to admission HF Medications: Losartan 50 mg daily **has not been taking atenolol PTA  Pertinent Lab Values: . Serum creatinine 1.26, BUN 23, Potassium 4.1, Sodium 137, BNP 92.3, Magnesium 2.0  Vital Signs: . Weight: 366 lbs (admission weight: 380 lb) . Blood pressure: 90-130/50-60s . Heart rate: 70s   Medication Assistance / Insurance Benefits Check: Does the patient have prescription insurance?  Yes Type of insurance plan: [employer] - commercial    Outpatient Pharmacy:  Prior to admission outpatient pharmacy: [facility] Is the patient willing to use [facility] TOC pharmacy at discharge? Yes Is the patient willing to transition their outpatient pharmacy to utilize a [facility] outpatient pharmacy?   Pending    Assessment: 1. Acute on chronic systolic CHF (EF 50-93%). NYHA class II symptoms. - Continue furosemide 80 mg IV BID - net negative 6L since admission, renal function stable  - Consider switching metoprolol to XL prior to discharge once stable dose achieved  - Losartan 50 mg daily has been held with low BP overnight. May be able to restart at lower dose prior to discharge pending BP improvement - Agree with starting spironolactone 25 mg daily - Avoid SGLT-2i due to hygiene limitations identified by HF RN navigator   Plan: 1) Medication changes recommended at  this time: - Agree with changes as above  2) Patient assistance application(s): Test claims completed showing the following: -Entresto copay: $200/month - can use copay card which would allow for a $10 copay for up to 3 month supply  3)  Education  - To be completed prior to discharge  Sharen Hones, PharmD, BCPS Heart Failure Stewardship Pharmacist Phone [phone]

## 2020-03-31 NOTE — TOC Benefit Eligibility Note (Signed)
Transition of Care [facility]) Benefit Eligibility Note    Patient Details  Name: [name] MRN: [id number] Date of Birth: 1970-05-31   Medication/Dose: BRILINTA 90 MG BID  Covered?: Yes  Tier:  (TIER- 4 DRUG)  Prescription Coverage Preferred Pharmacy: [facility] [place]  Spoke with Person/Company/Phone Number:: CATHERINE  @ [employer] # [phone]  Co-Pay: $ 412.72        Additional Notes: TICAGRELOR : Earle Gell Phone Number: 03/31/2020, 1:13 PM

## 2020-03-31 NOTE — Progress Notes (Signed)
Pt had cramps on her legs, thighs, and abdomen. Pt states today's cramps to be worse than yesterday.Pt thinks it's side effect of one of the med she is getting during this hospitalization. MD aware.

## 2020-03-31 NOTE — Progress Notes (Signed)
SATURATION QUALIFICATIONS: (This note is used to comply with regulatory documentation for home oxygen)  Patient Saturations on Room Air at Rest = 92%  Patient Saturations on Room Air while Ambulating =88 %  Patient Saturations on 1 Liters of oxygen while Ambulating = 92% Patient Saturations on 1 Liters of oxygen while Ambulating = 95%  Please briefly explain why patient needs home oxygen: Pt is dyspneic. While ambulating, her SpO2 drops to 88% on RA. And 92% at 1 L and 95% at 2 L.

## 2020-04-01 DIAGNOSIS — I5041 Acute combined systolic (congestive) and diastolic (congestive) heart failure: Secondary | ICD-10-CM

## 2020-04-01 LAB — CBC WITH DIFFERENTIAL/PLATELET
Abs Immature Granulocytes: 0.04 10*3/uL (ref 0.00–0.07)
Basophils Absolute: 0 10*3/uL (ref 0.0–0.1)
Basophils Relative: 0 %
Eosinophils Absolute: 0.3 10*3/uL (ref 0.0–0.5)
Eosinophils Relative: 2 %
HCT: 40.1 % (ref 36.0–46.0)
Hemoglobin: 11.8 g/dL — ABNORMAL LOW (ref 12.0–15.0)
Immature Granulocytes: 0 %
Lymphocytes Relative: 17 %
Lymphs Abs: 2 10*3/uL (ref 0.7–4.0)
MCH: 23.6 pg — ABNORMAL LOW (ref 26.0–34.0)
MCHC: 29.4 g/dL — ABNORMAL LOW (ref 30.0–36.0)
MCV: 80.4 fL (ref 80.0–100.0)
Monocytes Absolute: 0.8 10*3/uL (ref 0.1–1.0)
Monocytes Relative: 7 %
Neutro Abs: 8.7 10*3/uL — ABNORMAL HIGH (ref 1.7–7.7)
Neutrophils Relative %: 74 %
Platelets: 239 10*3/uL (ref 150–400)
RBC: 4.99 MIL/uL (ref 3.87–5.11)
RDW: 17 % — ABNORMAL HIGH (ref 11.5–15.5)
WBC: 11.8 10*3/uL — ABNORMAL HIGH (ref 4.0–10.5)
nRBC: 0 % (ref 0.0–0.2)

## 2020-04-01 LAB — BASIC METABOLIC PANEL
Anion gap: 12 (ref 5–15)
BUN: 21 mg/dL — ABNORMAL HIGH (ref 6–20)
CO2: 28 mmol/L (ref 22–32)
Calcium: 9 mg/dL (ref 8.9–10.3)
Chloride: 97 mmol/L — ABNORMAL LOW (ref 98–111)
Creatinine, Ser: 1.22 mg/dL — ABNORMAL HIGH (ref 0.44–1.00)
GFR, Estimated: 55 mL/min — ABNORMAL LOW (ref >60)
Glucose, Bld: 148 mg/dL — ABNORMAL HIGH (ref 70–99)
Potassium: 4.2 mmol/L (ref 3.5–5.1)
Sodium: 137 mmol/L (ref 135–145)

## 2020-04-01 LAB — MAGNESIUM: Magnesium: 2.1 mg/dL (ref 1.7–2.4)

## 2020-04-01 LAB — GLUCOSE, CAPILLARY
Glucose-Capillary: 132 mg/dL — ABNORMAL HIGH (ref 70–99)
Glucose-Capillary: 212 mg/dL — ABNORMAL HIGH (ref 70–99)
Glucose-Capillary: 205 mg/dL — ABNORMAL HIGH (ref 70–99)
Glucose-Capillary: 195 mg/dL — ABNORMAL HIGH (ref 70–99)

## 2020-04-01 MED ORDER — MAGNESIUM HYDROXIDE 400 MG/5ML PO SUSP
30.0000 mL | Freq: Every day | ORAL | Status: DC | PRN
  Administered 2020-04-01: 30 mL via ORAL
  Filled 2020-04-01: qty 30

## 2020-04-01 MED ORDER — IBUPROFEN 200 MG PO TABS
400.0000 mg | ORAL_TABLET | Freq: Once | ORAL | Status: AC | PRN
  Administered 2020-04-02: 400 mg via ORAL
  Filled 2020-04-01: qty 2

## 2020-04-01 MED ORDER — LIDOCAINE 5 % EX PTCH
1.0000 | MEDICATED_PATCH | CUTANEOUS | Status: DC
  Administered 2020-04-02 – 2020-04-03 (×2): 1 via TRANSDERMAL
  Filled 2020-04-01 (×2): qty 1

## 2020-04-01 MED ORDER — GABAPENTIN 300 MG PO CAPS
600.0000 mg | ORAL_CAPSULE | Freq: Every day | ORAL | Status: DC
  Administered 2020-04-01 – 2020-04-02 (×3): 600 mg via ORAL
  Filled 2020-04-01 (×3): qty 2

## 2020-04-01 MED ORDER — ROSUVASTATIN CALCIUM 20 MG PO TABS
20.0000 mg | ORAL_TABLET | ORAL | Status: DC
  Administered 2020-04-03: 20 mg via ORAL
  Filled 2020-04-01: qty 1

## 2020-04-01 NOTE — Progress Notes (Signed)
PROGRESS NOTE    Kemiya Batdorf   HQR:[id number]  DOB: Jun 23, 1970  DOA: 03/28/2020     3  PCP: Patient, No Pcp Per  CC: CP, SOB  Hospital Course: Ms. Lafon is a 50 yo female with PMH morbid obesity, IDDM, HTN, hypothyroidism, depression, GERD who presented to the hospital with worsening shortness of breath and chest pain.  She described worsening orthopnea and PND.  She underwent further work-up and was found to have elevated troponins and wall motion abnormalities on echo.  She was classified as NSTEMI and taken urgently to the Cath Lab on 03/28/2020.  She underwent placement of 1 DES to the mid LAD and had improvement post procedure in her chest pain. She still had ongoing shortness of breath which was considered being contributed from underlying low normal LV function and was started on diuresis with IV Lasix.   Interval History:  Developed significant myalgias late yesterday afternoon.  Likely due to her daily dosing of Lipitor as she only takes it 3 times a week at home.  Changed her to Crestor also 3 times a week but higher dose than home lipitor was and we will see how she tolerates.  Still denying any chest pain.  Breathing also starting to become a little less labored and more easily each day.  ROS: Constitutional: negative for chills and fevers, Respiratory: positive for dyspnea on exertion, Cardiovascular: positive for orthopnea and paroxysmal nocturnal dyspnea and Gastrointestinal: negative for abdominal pain  Assessment & Plan: * NSTEMI (non-ST elevated myocardial infarction) (HCC)-resolved as of 04/01/2020 - trops not fully trended to peak but escalated up to 2226. - now s/p LHC on 3/1 and underwent 1 DES to mid LAD (95% stenosis) - continue asa/brilinta. Copay for Brilinta is $412 (need to clarify if TOC will be supplying more affordably vs anticipate need to transition to plavix) - remains CP free; anxiety also improving   CAD (coronary artery disease) - s/p DES to mid  LAD on 03/28/20 - continue asa/bilinta for now - continue statin, lopressor (agree with need for eventual change to Toprol), ARB has been d/c for hypoTN per cardiology; spironolactone started on 3/4 - see NSTEMI - changed lipitor to crestor due to myalgias; at home was on low dose lipitor 3xweekly; will trial crestor 3 x weekly and see how she tolerates  Acute on chronic congestive heart failure (HCC) - elevated LVEDP during cath, 27 mmHg - echo on 3/1 showed EF 50-55% EF; read as low/normal function per cardiology with WMAs  - continue IV lasix; dosage increased further on 03/30/2020 per cardiology to Lasix 80 mg IV twice daily - strict I&O - meds now: lasix and spironolactone (latter added 3/4) -Follow BMP daily  Uncontrolled type 2 diabetes mellitus with hyperglycemia, with long-term current use of insulin (HCC) - A1c 7.7% on 03/28/20; prior A1c noted 11.7% on 12/29/19 - on very large doses of 70/30 outpatient; raises some concern for unintentional administration in scar tissues, etc - will convert from Lantus back to 70/30 to find steady dose: currently stable with 70/30 100 units BID  Major depressive disorder -No chronic home meds -Continue Ativan for now as patient is anxious with processing all the events of the hospitalization  Hypothyroidism - TSH 5.423, FT4 1.02 (normal) and TT3 168 (normal) -Continue Synthroid  Mixed hyperlipidemia due to type 2 diabetes mellitus (HCC) -Continue Lipitor which has been increased to max dose and she then developed severe myalgias on 03/31/20 - d/c lipitor - start crestor at  higher dose than she was on lipitor at home and we'll start with 3 times weekly then can be adjusted further outpatient  GERD without esophagitis -Continue Protonix  Essential hypertension - BP soft am of 3/4 - d/c losartan per cardiology - continue lopressor; spironolactone also added   Old records reviewed in assessment of this patient  Antimicrobials: n/a  DVT  prophylaxis: heparin injection 5,000 Units Start: 03/28/20 2200   Code Status:   Code Status: Full Code Family Communication: none present  Disposition Plan: Status is: Inpatient  Remains inpatient appropriate because:IV treatments appropriate due to intensity of illness or inability to take PO and Inpatient level of care appropriate due to severity of illness   Dispo: The patient is from: Home              Anticipated d/c is to: Home              Patient currently is not medically stable to d/c.   Difficult to place patient No   Risk of unplanned readmission score: Unplanned Admission- Pilot do not use: 14.93   Objective: Blood pressure (!) 101/54, pulse 76, temperature 98.7 F (37.1 C), temperature source Oral, resp. rate 18, height 5\' 6"  (1.676 m), weight (!) 165.1 kg, last menstrual period 12/07/2019, SpO2 98 %.  Examination: General appearance: alert, cooperative and no distress Head: Normocephalic, without obvious abnormality, atraumatic Eyes: EOMI Lungs: clear to auscultation bilaterally Heart: regular rate and rhythm and S1, S2 normal Abdomen: obese, soft, NT, BS distant and present Extremities: 3+ LE edema worse in LLE Skin: mobility and turgor normal Neurologic: Grossly normal  Consultants:   Cardiology  Procedures:   LHC, 03/28/20  Data Reviewed: I have personally reviewed following labs and imaging studies Results for orders placed or performed during the hospital encounter of 03/28/20 (from the past 24 hour(s))  Glucose, capillary     Status: Abnormal   Collection Time: 03/31/20  5:17 PM  Result Value Ref Range   Glucose-Capillary 131 (H) 70 - 99 mg/dL  Glucose, capillary     Status: Abnormal   Collection Time: 03/31/20  8:44 PM  Result Value Ref Range   Glucose-Capillary 108 (H) 70 - 99 mg/dL  Glucose, capillary     Status: None   Collection Time: 03/31/20 11:03 PM  Result Value Ref Range   Glucose-Capillary 77 70 - 99 mg/dL  CBC with  Differential/Platelet     Status: Abnormal   Collection Time: 04/01/20  3:28 AM  Result Value Ref Range   WBC 11.8 (H) 4.0 - 10.5 K/uL   RBC 4.99 3.87 - 5.11 MIL/uL   Hemoglobin 11.8 (L) 12.0 - 15.0 g/dL   HCT 06/01/20 41.9 - 37.9 %   MCV 80.4 80.0 - 100.0 fL   MCH 23.6 (L) 26.0 - 34.0 pg   MCHC 29.4 (L) 30.0 - 36.0 g/dL   RDW 02.4 (H) 09.7 - 35.3 %   Platelets 239 150 - 400 K/uL   nRBC 0.0 0.0 - 0.2 %   Neutrophils Relative % 74 %   Neutro Abs 8.7 (H) 1.7 - 7.7 K/uL   Lymphocytes Relative 17 %   Lymphs Abs 2.0 0.7 - 4.0 K/uL   Monocytes Relative 7 %   Monocytes Absolute 0.8 0.1 - 1.0 K/uL   Eosinophils Relative 2 %   Eosinophils Absolute 0.3 0.0 - 0.5 K/uL   Basophils Relative 0 %   Basophils Absolute 0.0 0.0 - 0.1 K/uL   Immature Granulocytes 0 %  Abs Immature Granulocytes 0.04 0.00 - 0.07 K/uL  Basic metabolic panel     Status: Abnormal   Collection Time: 04/01/20  3:28 AM  Result Value Ref Range   Sodium 137 135 - 145 mmol/L   Potassium 4.2 3.5 - 5.1 mmol/L   Chloride 97 (L) 98 - 111 mmol/L   CO2 28 22 - 32 mmol/L   Glucose, Bld 148 (H) 70 - 99 mg/dL   BUN 21 (H) 6 - 20 mg/dL   Creatinine, Ser 1.611.22 (H) 0.44 - 1.00 mg/dL   Calcium 9.0 8.9 - 09.610.3 mg/dL   GFR, Estimated 55 (L) >60 mL/min   Anion gap 12 5 - 15  Magnesium     Status: None   Collection Time: 04/01/20  3:28 AM  Result Value Ref Range   Magnesium 2.1 1.7 - 2.4 mg/dL  Glucose, capillary     Status: Abnormal   Collection Time: 04/01/20  7:58 AM  Result Value Ref Range   Glucose-Capillary 132 (H) 70 - 99 mg/dL  Glucose, capillary     Status: Abnormal   Collection Time: 04/01/20 12:12 PM  Result Value Ref Range   Glucose-Capillary 212 (H) 70 - 99 mg/dL    Recent Results (from the past 240 hour(s))  Resp Panel by RT-PCR (Flu A&B, Covid) Nasopharyngeal Swab     Status: None   Collection Time: 03/28/20  6:07 AM   Specimen: Nasopharyngeal Swab; Nasopharyngeal(NP) swabs in vial transport medium  Result Value  Ref Range Status   SARS Coronavirus 2 by RT PCR NEGATIVE NEGATIVE Final    Comment: (NOTE) SARS-CoV-2 target nucleic acids are NOT DETECTED.  The SARS-CoV-2 RNA is generally detectable in upper respiratory specimens during the acute phase of infection. The lowest concentration of SARS-CoV-2 viral copies this assay can detect is 138 copies/mL. A negative result does not preclude SARS-Cov-2 infection and should not be used as the sole basis for treatment or other patient management decisions. A negative result may occur with  improper specimen collection/handling, submission of specimen other than nasopharyngeal swab, presence of viral mutation(s) within the areas targeted by this assay, and inadequate number of viral copies(<138 copies/mL). A negative result must be combined with clinical observations, patient history, and epidemiological information. The expected result is Negative.  Fact Sheet for Patients:  BloggerCourse.comhttps://www.fda.gov/media/152166/download  Fact Sheet for Healthcare Providers:  SeriousBroker.ithttps://www.fda.gov/media/152162/download  This test is no t yet approved or cleared by the [place] FDA and  has been authorized for detection and/or diagnosis of SARS-CoV-2 by FDA under an Emergency Use Authorization (EUA). This EUA will remain  in effect (meaning this test can be used) for the duration of the COVID-19 declaration under Section 564(b)(1) of the Act, 21 U.S.C.section 360bbb-3(b)(1), unless the authorization is terminated  or revoked sooner.       Influenza A by PCR NEGATIVE NEGATIVE Final   Influenza B by PCR NEGATIVE NEGATIVE Final    Comment: (NOTE) The Xpert Xpress SARS-CoV-2/FLU/RSV plus assay is intended as an aid in the diagnosis of influenza from Nasopharyngeal swab specimens and should not be used as a sole basis for treatment. Nasal washings and aspirates are unacceptable for Xpert Xpress SARS-CoV-2/FLU/RSV testing.  Fact Sheet for  Patients: BloggerCourse.comhttps://www.fda.gov/media/152166/download  Fact Sheet for Healthcare Providers: SeriousBroker.ithttps://www.fda.gov/media/152162/download  This test is not yet approved or cleared by the [place] FDA and has been authorized for detection and/or diagnosis of SARS-CoV-2 by FDA under an Emergency Use Authorization (EUA). This EUA will remain in effect (meaning  this test can be used) for the duration of the COVID-19 declaration under Section 564(b)(1) of the Act, 21 U.S.C. section 360bbb-3(b)(1), unless the authorization is terminated or revoked.  Performed at [facility], [address], [place], [place] [address]      Radiology Studies: No results found. DG Chest 2 View  Final Result      Scheduled Meds: . aspirin  81 mg Oral Daily  . furosemide  80 mg Intravenous BID  . gabapentin  600 mg Oral QHS  . heparin  5,000 Units Subcutaneous Q8H  . influenza vac split quadrivalent PF  0.5 mL Intramuscular Tomorrow-1000  . insulin aspart  0-20 Units Subcutaneous TID WC  . insulin aspart  0-5 Units Subcutaneous QHS  . insulin aspart protamine- aspart  100 Units Subcutaneous BID WC  . levothyroxine  50 mcg Oral Q0600  . mouth rinse  15 mL Mouth Rinse BID  . metoprolol tartrate  25 mg Oral BID  . nystatin   Topical TID  . pantoprazole  40 mg Oral Daily  . [START ON 04/03/2020] rosuvastatin  20 mg Oral Once per day on Mon Wed Fri  . sodium chloride flush  3 mL Intravenous Q12H  . spironolactone  25 mg Oral Daily  . ticagrelor  90 mg Oral BID   PRN Meds: sodium chloride, acetaminophen, LORazepam, ondansetron (ZOFRAN) IV, sodium chloride flush Continuous Infusions: . sodium chloride       LOS: 3 days  Time spent: Greater than 50% of the 35 minute visit was spent in counseling/coordination of care for the patient as laid out in the A&P.   Lewie Chamber, MD [facility] 04/01/2020, 2:39 PM

## 2020-04-01 NOTE — Plan of Care (Signed)
  Problem: Cardiovascular: Goal: Ability to achieve and maintain adequate cardiovascular perfusion will improve Outcome: Progressing Goal: Vascular access site(s) Level 0-1 will be maintained Outcome: Progressing   Problem: Activity: Goal: Ability to return to baseline activity level will improve Outcome: Progressing   Problem: Education: Goal: Understanding of CV disease, CV risk reduction, and recovery process will improve Outcome: Progressing   Problem: Safety: Goal: Ability to remain free from injury will improve Outcome: Progressing

## 2020-04-01 NOTE — Progress Notes (Signed)
Progress Note  Patient Name: [name] Date of Encounter: 04/01/2020  Primary Cardiologist: [name], MD  Subjective   Sitting in bedside chair.  No chest pain or breathlessness.  Did ambulate yesterday and felt like legs were somewhat less tight.  Inpatient Medications    Scheduled Meds: . aspirin  81 mg Oral Daily  . furosemide  80 mg Intravenous BID  . gabapentin  600 mg Oral QHS  . heparin  5,000 Units Subcutaneous Q8H  . influenza vac split quadrivalent PF  0.5 mL Intramuscular Tomorrow-1000  . insulin aspart  0-20 Units Subcutaneous TID WC  . insulin aspart  0-5 Units Subcutaneous QHS  . insulin aspart protamine- aspart  100 Units Subcutaneous BID WC  . levothyroxine  50 mcg Oral Q0600  . mouth rinse  15 mL Mouth Rinse BID  . metoprolol tartrate  25 mg Oral BID  . nystatin   Topical TID  . pantoprazole  40 mg Oral Daily  . [START ON 04/03/2020] rosuvastatin  20 mg Oral Once per day on Mon Wed Fri  . sodium chloride flush  3 mL Intravenous Q12H  . spironolactone  25 mg Oral Daily  . ticagrelor  90 mg Oral BID   Continuous Infusions: . sodium chloride     PRN Meds: sodium chloride, acetaminophen, LORazepam, ondansetron (ZOFRAN) IV, sodium chloride flush   Vital Signs    Vitals:   03/31/20 1742 03/31/20 1959 03/31/20 2138 04/01/20 0404  BP: 125/74 (!) 116/59 123/69 (!) 146/77  Pulse: 77 76 75 81  Resp: 18 18  18   Temp:  98.7 F (37.1 C)  97.8 F (36.6 C)  TempSrc:  Oral  Axillary  SpO2:  96%  98%  Weight:    (!) 165.1 kg  Height:        Intake/Output Summary (Last 24 hours) at 04/01/2020 0845 Last data filed at 04/01/2020 06/01/2020 Gross per 24 hour  Intake 726 ml  Output 3600 ml  Net -2874 ml   Filed Weights   03/29/20 0500 03/31/20 0606 04/01/20 0404  Weight: (!) 164.8 kg (!) 166.1 kg (!) 165.1 kg    Telemetry    Sinus rhythm.  Personally reviewed.  ECG    An ECG dated 03/30/2020 was personally reviewed today and demonstrated:  Sinus  rhythm with LVH, poor R wave progression, left anterior fascicular block.  Physical Exam   GEN: No acute distress.   Neck: No JVD. Cardiac: RRR, no gallop.  Respiratory: Nonlabored. Clear to auscultation bilaterally. GI:  Obese with pannus, bowel sounds present. MS:  Persistent leg edema.  Labs    Chemistry Recent Labs  Lab 03/30/20 0308 03/31/20 0314 04/01/20 0328  NA 138 137 137  K 3.6 4.1 4.2  CL 97* 97* 97*  CO2 30 30 28   GLUCOSE 229* 252* 148*  BUN 19 23* 21*  CREATININE 1.27* 1.26* 1.22*  CALCIUM 9.1 9.1 9.0  GFRNONAA 52* 53* 55*  ANIONGAP 11 10 12      Hematology Recent Labs  Lab 03/30/20 0308 03/31/20 0314 04/01/20 0328  WBC 9.7 10.9* 11.8*  RBC 4.70 5.01 4.99  HGB 11.5* 11.9* 11.8*  HCT 37.1 40.4 40.1  MCV 78.9* 80.6 80.4  MCH 24.5* 23.8* 23.6*  MCHC 31.0 29.5* 29.4*  RDW 17.3* 17.2* 17.0*  PLT 239 241 239    Cardiac Enzymes Recent Labs  Lab 03/28/20 0347 03/28/20 0606 03/28/20 1433 03/28/20 2016 03/29/20 0036  TROPONINIHS 514* 539* 1,302* 1,643* 2,226*  BNP Recent Labs  Lab 03/28/20 0506  BNP 92.3     Radiology    No results found.  Cardiac Studies   Echocardiogram 03/28/2020: 1. Overall poor image quality even with definity Septal and apical  hypokinesis . Left ventricular ejection fraction, by estimation, is 50 to  55%. The left ventricle has low normal function. The left ventricle has no  regional wall motion abnormalities.  Left ventricular diastolic parameters were normal.  2. Right ventricular systolic function is normal. The right ventricular  size is normal.  3. Left atrial size was mildly dilated.  4. The mitral valve is normal in structure. No evidence of mitral valve  regurgitation. No evidence of mitral stenosis. 5. The aortic valve was not well visualized. Aortic valve regurgitation  is not visualized. No aortic stenosis is present.  6. The inferior vena cava is normal in size with greater than 50%   respiratory variability, suggesting right atrial pressure of 3 mmHg.  Cardiac catheterization 03/28/2020:  Dist RCA lesion is 25% stenosed.  Mid LAD-1 lesion is 95% stenosed.  A drug-eluting stent was successfully placed using a STENT RESOLUTE ONYX [id number]. The proximal stent was postdilated with a 3.75 mm Starbuck baloon.  Post intervention, there is a 0% residual stenosis.  Mid LAD-2 lesion is 50% stenosed.  LV end diastolic pressure is moderately elevated. LVEDP 27 mm Hg.  There is no aortic valve stenosis.   Continue dual antiplatelet therapy for 12 months. Consider clopidogrel monotherapy after 12 months, along with aggressive secondary prevention.    Increase statin to high potency.    Continue with plans for diuresis.  D/w Dr. Jens Som.   Patient Profile     50 y.o. female with past medical history of diabetes mellitus, hypertension, hyperlipidemia, obstructive sleep apnea, morbid obesity admitted with acute combined systolic/diastolic congestive heart failure and non-ST elevation myocardial infarction.  Echocardiogram showed ejection fraction 50 to 55% with apical hypokinesis, mild left atrial enlargement. Developed recurrent chest pain and had urgent cardiac catheterization which revealed 95% mid LAD which was treated with a drug-eluting stent.  Assessment & Plan    1.  NSTEMI with peak high-sensitivity troponin I of 2226.  Now status post DES to the mid LAD on March 1.  Chest pain-free and hemodynamically stable.  On aspirin, Brilinta, Lopressor, and Crestor.  2.  Acute combined heart failure, LVEF 50 to 55% with apical septal hypokinesis.  Still with evidence of fluid overload but diuresing well, approximately 2600 cc out more than in last 24 hours.  Continues on IV Lasix 80 mg twice daily, also Aldactone and Lopressor.  ARB discontinued recently due to low blood pressure.  3.  Mixed hyperlipidemia, recent LDL 57.  Currently on Crestor.  4.  Essential hypertension, recent  systolics 120s to 140s.  5.  Morbid obesity, at risk for OSA.  No specific change in medical therapy for now, would continue with IV diuresis given good response and clinical improvement with stable renal function.  Continue to follow blood pressure trend, hold off on adding back ARB at this point.  Follow-up BMET in a.m.  Signed, Nona Dell, MD  04/01/2020, 8:45 AM

## 2020-04-02 DIAGNOSIS — K5900 Constipation, unspecified: Secondary | ICD-10-CM

## 2020-04-02 DIAGNOSIS — J9611 Chronic respiratory failure with hypoxia: Secondary | ICD-10-CM

## 2020-04-02 DIAGNOSIS — J9601 Acute respiratory failure with hypoxia: Secondary | ICD-10-CM

## 2020-04-02 LAB — CBC WITH DIFFERENTIAL/PLATELET
Abs Immature Granulocytes: 0.05 10*3/uL (ref 0.00–0.07)
Basophils Absolute: 0 10*3/uL (ref 0.0–0.1)
Basophils Relative: 0 %
Eosinophils Absolute: 0.2 10*3/uL (ref 0.0–0.5)
Eosinophils Relative: 2 %
HCT: 41.1 % (ref 36.0–46.0)
Hemoglobin: 12.3 g/dL (ref 12.0–15.0)
Immature Granulocytes: 0 %
Lymphocytes Relative: 18 %
Lymphs Abs: 2.2 10*3/uL (ref 0.7–4.0)
MCH: 24 pg — ABNORMAL LOW (ref 26.0–34.0)
MCHC: 29.9 g/dL — ABNORMAL LOW (ref 30.0–36.0)
MCV: 80.3 fL (ref 80.0–100.0)
Monocytes Absolute: 0.7 10*3/uL (ref 0.1–1.0)
Monocytes Relative: 6 %
Neutro Abs: 8.9 10*3/uL — ABNORMAL HIGH (ref 1.7–7.7)
Neutrophils Relative %: 74 %
Platelets: 228 10*3/uL (ref 150–400)
RBC: 5.12 MIL/uL — ABNORMAL HIGH (ref 3.87–5.11)
RDW: 16.9 % — ABNORMAL HIGH (ref 11.5–15.5)
WBC: 12.2 10*3/uL — ABNORMAL HIGH (ref 4.0–10.5)
nRBC: 0 % (ref 0.0–0.2)

## 2020-04-02 LAB — BASIC METABOLIC PANEL
Anion gap: 14 (ref 5–15)
BUN: 28 mg/dL — ABNORMAL HIGH (ref 6–20)
CO2: 30 mmol/L (ref 22–32)
Calcium: 9.1 mg/dL (ref 8.9–10.3)
Chloride: 95 mmol/L — ABNORMAL LOW (ref 98–111)
Creatinine, Ser: 1.27 mg/dL — ABNORMAL HIGH (ref 0.44–1.00)
GFR, Estimated: 52 mL/min — ABNORMAL LOW (ref >60)
Glucose, Bld: 116 mg/dL — ABNORMAL HIGH (ref 70–99)
Potassium: 3.7 mmol/L (ref 3.5–5.1)
Sodium: 139 mmol/L (ref 135–145)

## 2020-04-02 LAB — GLUCOSE, CAPILLARY
Glucose-Capillary: 113 mg/dL — ABNORMAL HIGH (ref 70–99)
Glucose-Capillary: 115 mg/dL — ABNORMAL HIGH (ref 70–99)
Glucose-Capillary: 180 mg/dL — ABNORMAL HIGH (ref 70–99)
Glucose-Capillary: 186 mg/dL — ABNORMAL HIGH (ref 70–99)
Glucose-Capillary: 198 mg/dL — ABNORMAL HIGH (ref 70–99)
Glucose-Capillary: 278 mg/dL — ABNORMAL HIGH (ref 70–99)
Glucose-Capillary: 92 mg/dL (ref 70–99)

## 2020-04-02 LAB — MAGNESIUM: Magnesium: 2.2 mg/dL (ref 1.7–2.4)

## 2020-04-02 MED ORDER — SENNOSIDES-DOCUSATE SODIUM 8.6-50 MG PO TABS
1.0000 | ORAL_TABLET | Freq: Two times a day (BID) | ORAL | Status: DC
  Administered 2020-04-02 (×2): 1 via ORAL
  Filled 2020-04-02 (×3): qty 1

## 2020-04-02 MED ORDER — MAGNESIUM CITRATE PO SOLN
1.0000 | Freq: Once | ORAL | Status: AC
  Administered 2020-04-02: 1 via ORAL
  Filled 2020-04-02 (×2): qty 296

## 2020-04-02 MED ORDER — POLYETHYLENE GLYCOL 3350 17 G PO PACK
17.0000 g | PACK | Freq: Every day | ORAL | Status: DC
  Administered 2020-04-02: 17 g via ORAL
  Filled 2020-04-02 (×2): qty 1

## 2020-04-02 MED ORDER — INSULIN ASPART PROT & ASPART (70-30 MIX) 100 UNIT/ML ~~LOC~~ SUSP
90.0000 [IU] | Freq: Two times a day (BID) | SUBCUTANEOUS | Status: DC
  Administered 2020-04-02 – 2020-04-03 (×3): 90 [IU] via SUBCUTANEOUS
  Filled 2020-04-02: qty 10

## 2020-04-02 NOTE — Progress Notes (Addendum)
Pt woke this AM feeling weak, lightheaded, nauseated, and dizzy.  Pt stated blood sugar "felt low". Wanted to immediately be helped out of the bed d/t feeling as though she may have panic attack.  Vitals @ 5:21 all were WNL;   CBG @ 5:21 was 92 (4 oz OJ given)   CBG rechecked @ 613 was 115.  Unable to get standing wt d/t weakness & dizziness IV Zofran given   Will continue to monitor & report to day shift RN

## 2020-04-02 NOTE — Progress Notes (Signed)
PROGRESS NOTE    [name]   ZOX:[id number]  DOB: 01-11-1971  DOA: 03/28/2020     4  PCP: Patient, No Pcp Per  CC: CP, SOB  Hospital Course: [name] is a 50 yo female with PMH morbid obesity, IDDM, HTN, hypothyroidism, depression, GERD who presented to the hospital with worsening shortness of breath and chest pain.  She described worsening orthopnea and PND.  She underwent further work-up and was found to have elevated troponins and wall motion abnormalities on echo.  She was classified as NSTEMI and taken urgently to the Cath Lab on 03/28/2020.  She underwent placement of 1 DES to the mid LAD and had improvement post procedure in her chest pain. She still had ongoing shortness of breath which was considered being contributed from underlying low normal LV function and was started on diuresis with IV Lasix.   Interval History:  Myalgias have improved some from withholding statin.  Breathing also has been improving with ongoing diuresis.  She was restless this morning and wanting to be discharged when talking with cardiology.  We discussed her ongoing need for oxygen and this would need to be set up for home use but the hope is to be able to wean her off prior to discharge.  She was agreeable to remaining in the hospital 1 more night to see if any further improvement in oxygenation. Still endorsing constipation and stated if no improvement later today, would escalate treatment further.  ROS: Constitutional: negative for chills and fevers, Respiratory: positive for dyspnea on exertion, Cardiovascular: positive for orthopnea and paroxysmal nocturnal dyspnea and Gastrointestinal: negative for abdominal pain  Assessment & Plan: * NSTEMI (non-ST elevated myocardial infarction) (HCC)-resolved as of 04/01/2020 - trops not fully trended to peak but escalated up to 2226. - now s/p LHC on 3/1 and underwent 1 DES to mid LAD (95% stenosis) - continue asa/brilinta. Copay for Brilinta is $412; even  with prescription assistance, will still be unaffordable.  Tentative plan will be for 1 month Brilinta with free co-pay card then probable transition to Plavix to finish out therapy - remains CP free; anxiety also improving   Acute respiratory failure with hypoxia (HCC) -Has been requiring oxygen during hospitalization.  Also desatted on walk test.  Have been weaning as able.  Etiology considered due to underlying vascular congestion/volume overload -Has been improving some with ongoing Lasix -Continue repeating walk test daily and wean oxygen as able  CAD (coronary artery disease) - s/p DES to mid LAD on 03/28/20 - continue asa/bilinta for now - continue statin, lopressor (agree with need for eventual change to Toprol), ARB has been d/c for hypoTN per cardiology; spironolactone started on 3/4 - see NSTEMI - changed lipitor to crestor due to myalgias; at home was on low dose lipitor 3xweekly; will trial crestor 3 x weekly and see how she tolerates  Acute on chronic congestive heart failure (HCC) - elevated LVEDP during cath, 27 mmHg - echo on 3/1 showed EF 50-55% EF; read as low/normal function per cardiology with WMAs  - continue IV lasix; dosage increased further on 03/30/2020 per cardiology to Lasix 80 mg IV twice daily - net negative 9.8L for admission as of 04/02/20 - strict I&O - meds now: lasix and spironolactone (latter added 3/4) - Follow BMP daily (remaining stable)  Uncontrolled type 2 diabetes mellitus with hyperglycemia, with long-term current use of insulin (HCC) - A1c 7.7% on 03/28/20; prior A1c noted 11.7% on 12/29/19 - on very large doses of  70/30 outpatient; raises some concern for unintentional administration in scar tissues, etc -Has been starting to feel lethargic/weak and describing symptoms of hypoglycemia that she experiences at home.  She has been on lower doses of insulin during hospitalization than her home doses; also raising concern for probable noncompliance with diet  at home as she has been on carb consistent diet while in-house -Decrease NovoLog 70/30 down to 90 units twice daily and hold as needed for hypoglycemic symptoms  Constipation -No bowel movement per patient for several days now. -Started on scheduled MiraLAX and Senokot S; still no improvement -Give mag citrate today and monitor  Major depressive disorder -No chronic home meds -Continue Ativan for now as patient is anxious with processing all the events of the hospitalization  Hypothyroidism - TSH 5.423, FT4 1.02 (normal) and TT3 168 (normal) -Continue Synthroid  Mixed hyperlipidemia due to type 2 diabetes mellitus (HCC) -Continue Lipitor which has been increased to max dose and she then developed severe myalgias on 03/31/20 - d/c lipitor - start crestor at higher dose than she was on lipitor at home and we'll start with 3 times weekly then can be adjusted further outpatient  GERD without esophagitis -Continue Protonix  Essential hypertension - BP soft am of 3/4 - d/c losartan per cardiology - continue lopressor; spironolactone also added   Old records reviewed in assessment of this patient  Antimicrobials: n/a  DVT prophylaxis: heparin injection 5,000 Units Start: 03/28/20 2200   Code Status:   Code Status: Full Code Family Communication: none present  Disposition Plan: Status is: Inpatient  Remains inpatient appropriate because:IV treatments appropriate due to intensity of illness or inability to take PO and Inpatient level of care appropriate due to severity of illness   Dispo: The patient is from: Home              Anticipated d/c is to: Home              Patient currently is not medically stable to d/c.   Difficult to place patient No   Risk of unplanned readmission score: Unplanned Admission- Pilot do not use: 14.61   Objective: Blood pressure 121/68, pulse 71, temperature 98.4 F (36.9 C), temperature source Oral, resp. rate 18, height 5\' 6"  (1.676 m),  weight (!) 165.7 kg, last menstrual period 12/07/2019, SpO2 96 %.  Examination: General appearance: alert, cooperative and no distress Head: Normocephalic, without obvious abnormality, atraumatic Eyes: EOMI Lungs: clear to auscultation bilaterally Heart: regular rate and rhythm and S1, S2 normal Abdomen: obese, soft, NT, BS distant and present Extremities: 3+ LE edema worse in LLE Skin: mobility and turgor normal Neurologic: Grossly normal  Consultants:   Cardiology  Procedures:   LHC, 03/28/20  Data Reviewed: I have personally reviewed following labs and imaging studies Results for orders placed or performed during the hospital encounter of 03/28/20 (from the past 24 hour(s))  Glucose, capillary     Status: Abnormal   Collection Time: 04/01/20  4:27 PM  Result Value Ref Range   Glucose-Capillary 205 (H) 70 - 99 mg/dL  Glucose, capillary     Status: Abnormal   Collection Time: 04/01/20  9:09 PM  Result Value Ref Range   Glucose-Capillary 195 (H) 70 - 99 mg/dL  Basic metabolic panel     Status: Abnormal   Collection Time: 04/02/20  1:33 AM  Result Value Ref Range   Sodium 139 135 - 145 mmol/L   Potassium 3.7 3.5 - 5.1 mmol/L   Chloride  95 (L) 98 - 111 mmol/L   CO2 30 22 - 32 mmol/L   Glucose, Bld 116 (H) 70 - 99 mg/dL   BUN 28 (H) 6 - 20 mg/dL   Creatinine, Ser 2.951.27 (H) 0.44 - 1.00 mg/dL   Calcium 9.1 8.9 - 28.410.3 mg/dL   GFR, Estimated 52 (L) >60 mL/min   Anion gap 14 5 - 15  Magnesium     Status: None   Collection Time: 04/02/20  1:33 AM  Result Value Ref Range   Magnesium 2.2 1.7 - 2.4 mg/dL  CBC with Differential/Platelet     Status: Abnormal   Collection Time: 04/02/20  1:33 AM  Result Value Ref Range   WBC 12.2 (H) 4.0 - 10.5 K/uL   RBC 5.12 (H) 3.87 - 5.11 MIL/uL   Hemoglobin 12.3 12.0 - 15.0 g/dL   HCT 13.241.1 44.036.0 - 10.246.0 %   MCV 80.3 80.0 - 100.0 fL   MCH 24.0 (L) 26.0 - 34.0 pg   MCHC 29.9 (L) 30.0 - 36.0 g/dL   RDW 72.516.9 (H) 36.611.5 - 44.015.5 %   Platelets 228  150 - 400 K/uL   nRBC 0.0 0.0 - 0.2 %   Neutrophils Relative % 74 %   Neutro Abs 8.9 (H) 1.7 - 7.7 K/uL   Lymphocytes Relative 18 %   Lymphs Abs 2.2 0.7 - 4.0 K/uL   Monocytes Relative 6 %   Monocytes Absolute 0.7 0.1 - 1.0 K/uL   Eosinophils Relative 2 %   Eosinophils Absolute 0.2 0.0 - 0.5 K/uL   Basophils Relative 0 %   Basophils Absolute 0.0 0.0 - 0.1 K/uL   Immature Granulocytes 0 %   Abs Immature Granulocytes 0.05 0.00 - 0.07 K/uL  Glucose, capillary     Status: None   Collection Time: 04/02/20  5:21 AM  Result Value Ref Range   Glucose-Capillary 92 70 - 99 mg/dL  Glucose, capillary     Status: Abnormal   Collection Time: 04/02/20  6:13 AM  Result Value Ref Range   Glucose-Capillary 115 (H) 70 - 99 mg/dL  Glucose, capillary     Status: Abnormal   Collection Time: 04/02/20  7:27 AM  Result Value Ref Range   Glucose-Capillary 113 (H) 70 - 99 mg/dL  Glucose, capillary     Status: Abnormal   Collection Time: 04/02/20 11:56 AM  Result Value Ref Range   Glucose-Capillary 180 (H) 70 - 99 mg/dL    Recent Results (from the past 240 hour(s))  Resp Panel by RT-PCR (Flu A&B, Covid) Nasopharyngeal Swab     Status: None   Collection Time: 03/28/20  6:07 AM   Specimen: Nasopharyngeal Swab; Nasopharyngeal(NP) swabs in vial transport medium  Result Value Ref Range Status   SARS Coronavirus 2 by RT PCR NEGATIVE NEGATIVE Final    Comment: (NOTE) SARS-CoV-2 target nucleic acids are NOT DETECTED.  The SARS-CoV-2 RNA is generally detectable in upper respiratory specimens during the acute phase of infection. The lowest concentration of SARS-CoV-2 viral copies this assay can detect is 138 copies/mL. A negative result does not preclude SARS-Cov-2 infection and should not be used as the sole basis for treatment or other patient management decisions. A negative result may occur with  improper specimen collection/handling, submission of specimen other than nasopharyngeal swab, presence of  viral mutation(s) within the areas targeted by this assay, and inadequate number of viral copies(<138 copies/mL). A negative result must be combined with clinical observations, patient history, and epidemiological information. The  expected result is Negative.  Fact Sheet for Patients:  BloggerCourse.com  Fact Sheet for Healthcare Providers:  SeriousBroker.it  This test is no t yet approved or cleared by the [place] FDA and  has been authorized for detection and/or diagnosis of SARS-CoV-2 by FDA under an Emergency Use Authorization (EUA). This EUA will remain  in effect (meaning this test can be used) for the duration of the COVID-19 declaration under Section 564(b)(1) of the Act, 21 U.S.C.section 360bbb-3(b)(1), unless the authorization is terminated  or revoked sooner.       Influenza A by PCR NEGATIVE NEGATIVE Final   Influenza B by PCR NEGATIVE NEGATIVE Final    Comment: (NOTE) The Xpert Xpress SARS-CoV-2/FLU/RSV plus assay is intended as an aid in the diagnosis of influenza from Nasopharyngeal swab specimens and should not be used as a sole basis for treatment. Nasal washings and aspirates are unacceptable for Xpert Xpress SARS-CoV-2/FLU/RSV testing.  Fact Sheet for Patients: BloggerCourse.com  Fact Sheet for Healthcare Providers: SeriousBroker.it  This test is not yet approved or cleared by the [place] FDA and has been authorized for detection and/or diagnosis of SARS-CoV-2 by FDA under an Emergency Use Authorization (EUA). This EUA will remain in effect (meaning this test can be used) for the duration of the COVID-19 declaration under Section 564(b)(1) of the Act, 21 U.S.C. section 360bbb-3(b)(1), unless the authorization is terminated or revoked.  Performed at [facility], [address], [place], [place] [address]      Radiology Studies: No  results found. DG Chest 2 View  Final Result      Scheduled Meds: . aspirin  81 mg Oral Daily  . furosemide  80 mg Intravenous BID  . gabapentin  600 mg Oral QHS  . heparin  5,000 Units Subcutaneous Q8H  . influenza vac split quadrivalent PF  0.5 mL Intramuscular Tomorrow-1000  . insulin aspart  0-20 Units Subcutaneous TID WC  . insulin aspart  0-5 Units Subcutaneous QHS  . insulin aspart protamine- aspart  90 Units Subcutaneous BID WC  . levothyroxine  50 mcg Oral Q0600  . lidocaine  1 patch Transdermal Q24H  . magnesium citrate  1 Bottle Oral Once  . mouth rinse  15 mL Mouth Rinse BID  . metoprolol tartrate  25 mg Oral BID  . nystatin   Topical TID  . pantoprazole  40 mg Oral Daily  . polyethylene glycol  17 g Oral Daily  . [START ON 04/03/2020] rosuvastatin  20 mg Oral Once per day on Mon Wed Fri  . senna-docusate  1 tablet Oral BID  . sodium chloride flush  3 mL Intravenous Q12H  . spironolactone  25 mg Oral Daily  . ticagrelor  90 mg Oral BID   PRN Meds: sodium chloride, acetaminophen, LORazepam, magnesium hydroxide, ondansetron (ZOFRAN) IV, sodium chloride flush Continuous Infusions: . sodium chloride       LOS: 4 days  Time spent: Greater than 50% of the 35 minute visit was spent in counseling/coordination of care for the patient as laid out in the A&P.   Lewie Chamber, MD [facility] 04/02/2020, 4:04 PM

## 2020-04-02 NOTE — Assessment & Plan Note (Addendum)
-  Has been requiring oxygen during hospitalization.  Also desatted on walk test.  Have been weaning as able.  Etiology considered due to underlying vascular congestion/volume overload -Has been improving some with ongoing Lasix -Oxygen arranged at time of discharge -will also need outpatient sleep study which patient was informed about

## 2020-04-02 NOTE — Assessment & Plan Note (Addendum)
-   resolved with laxatives inpatient

## 2020-04-02 NOTE — Progress Notes (Signed)
Progress Note  Patient Name: [name] Date of Encounter: 04/02/2020  Primary Cardiologist: [name], MD  Subjective   Woke up this morning feeling very weak all over, thought that she was hypoglycemic, but CBG 90 to low 110.  No chest pain or shortness of breath.  States that she slept well otherwise and did ambulate some late yesterday.  Inpatient Medications    Scheduled Meds: . aspirin  81 mg Oral Daily  . furosemide  80 mg Intravenous BID  . gabapentin  600 mg Oral QHS  . heparin  5,000 Units Subcutaneous Q8H  . influenza vac split quadrivalent PF  0.5 mL Intramuscular Tomorrow-1000  . insulin aspart  0-20 Units Subcutaneous TID WC  . insulin aspart  0-5 Units Subcutaneous QHS  . insulin aspart protamine- aspart  100 Units Subcutaneous BID WC  . levothyroxine  50 mcg Oral Q0600  . lidocaine  1 patch Transdermal Q24H  . mouth rinse  15 mL Mouth Rinse BID  . metoprolol tartrate  25 mg Oral BID  . nystatin   Topical TID  . pantoprazole  40 mg Oral Daily  . [START ON 04/03/2020] rosuvastatin  20 mg Oral Once per day on Mon Wed Fri  . sodium chloride flush  3 mL Intravenous Q12H  . spironolactone  25 mg Oral Daily  . ticagrelor  90 mg Oral BID   Continuous Infusions: . sodium chloride     PRN Meds: sodium chloride, acetaminophen, LORazepam, magnesium hydroxide, ondansetron (ZOFRAN) IV, sodium chloride flush   Vital Signs    Vitals:   04/01/20 2022 04/01/20 2142 04/02/20 0355 04/02/20 0521  BP: 139/60 133/72 128/62 103/73  Pulse: 73 76 74 75  Resp: 18  18 19   Temp: 97.8 F (36.6 C)  98.2 F (36.8 C) 97.8 F (36.6 C)  TempSrc: Oral  Oral Oral  SpO2: 99%  98% 95%  Weight:   (!) 165.7 kg   Height:        Intake/Output Summary (Last 24 hours) at 04/02/2020 0812 Last data filed at 04/02/2020 0543 Gross per 24 hour  Intake 1739 ml  Output 2700 ml  Net -961 ml   Filed Weights   03/31/20 0606 04/01/20 0404 04/02/20 0355  Weight: (!) 166.1 kg (!) 165.1  kg (!) 165.7 kg    Telemetry    Sinus rhythm..  Personally reviewed.  ECG    An ECG dated 03/30/2020 was personally reviewed today and demonstrated:  Sinus rhythm with LVH, poor R wave progression, left anterior fascicular block.  Physical Exam   GEN: No acute distress.   Neck:  No JVD. Cardiac:  RRR without gallop.  Respiratory: Nonlabored.  Clear to auscultation bilaterally. GI:  Obese with pannus, bowel sounds present. MS:  Improving but persistent leg edema.  Labs    Chemistry Recent Labs  Lab 03/31/20 0314 04/01/20 0328 04/02/20 0133  NA 137 137 139  K 4.1 4.2 3.7  CL 97* 97* 95*  CO2 30 28 30   GLUCOSE 252* 148* 116*  BUN 23* 21* 28*  CREATININE 1.26* 1.22* 1.27*  CALCIUM 9.1 9.0 9.1  GFRNONAA 53* 55* 52*  ANIONGAP 10 12 14      Hematology Recent Labs  Lab 03/31/20 0314 04/01/20 0328 04/02/20 0133  WBC 10.9* 11.8* 12.2*  RBC 5.01 4.99 5.12*  HGB 11.9* 11.8* 12.3  HCT 40.4 40.1 41.1  MCV 80.6 80.4 80.3  MCH 23.8* 23.6* 24.0*  MCHC 29.5* 29.4* 29.9*  RDW 17.2* 17.0*  16.9*  PLT 241 239 228    Cardiac Enzymes Recent Labs  Lab 03/28/20 0347 03/28/20 0606 03/28/20 1433 03/28/20 2016 03/29/20 0036  TROPONINIHS 514* 539* 1,302* 1,643* 2,226*    BNP Recent Labs  Lab 03/28/20 0506  BNP 92.3     Radiology    No results found.  Cardiac Studies   Echocardiogram 03/28/2020: 1. Overall poor image quality even with definity Septal and apical  hypokinesis . Left ventricular ejection fraction, by estimation, is 50 to  55%. The left ventricle has low normal function. The left ventricle has no  regional wall motion abnormalities.  Left ventricular diastolic parameters were normal.  2. Right ventricular systolic function is normal. The right ventricular  size is normal.  3. Left atrial size was mildly dilated.  4. The mitral valve is normal in structure. No evidence of mitral valve  regurgitation. No evidence of mitral stenosis. 5. The  aortic valve was not well visualized. Aortic valve regurgitation  is not visualized. No aortic stenosis is present.  6. The inferior vena cava is normal in size with greater than 50%  respiratory variability, suggesting right atrial pressure of 3 mmHg.  Cardiac catheterization 03/28/2020:  Dist RCA lesion is 25% stenosed.  Mid LAD-1 lesion is 95% stenosed.  A drug-eluting stent was successfully placed using a STENT RESOLUTE ONYX [id number]. The proximal stent was postdilated with a 3.75 mm [place] baloon.  Post intervention, there is a 0% residual stenosis.  Mid LAD-2 lesion is 50% stenosed.  LV end diastolic pressure is moderately elevated. LVEDP 27 mm Hg.  There is no aortic valve stenosis.   Continue dual antiplatelet therapy for 12 months. Consider clopidogrel monotherapy after 12 months, along with aggressive secondary prevention.    Increase statin to high potency.    Continue with plans for diuresis.  D/w Dr. Jens Som.   Patient Profile     50 y.o. female with past medical history of diabetes mellitus, hypertension, hyperlipidemia, obstructive sleep apnea, morbid obesity admitted with acute combined systolic/diastolic congestive heart failure and non-ST elevation myocardial infarction.  Echocardiogram showed ejection fraction 50 to 55% with apical hypokinesis, mild left atrial enlargement. Developed recurrent chest pain and had urgent cardiac catheterization which revealed 95% mid LAD which was treated with a drug-eluting stent.  Assessment & Plan    1.  NSTEMI with peak high-sensitivity troponin I of 2226.  Now status post DES to the mid LAD on March 1.  She reports no chest pain or breathlessness, has tolerated ambulation.  Remains on aspirin, Brilinta, Lopressor, and Crestor.  2.  Acute combined heart failure, LVEF 50 to 55% with apical septal hypokinesis.  She has continued on on IV Lasix 80 mg twice daily, also Aldactone and Lopressor.  ARB discontinued recently due to low  blood pressure.  Approximately 1000 cc net out more than in last 24 hours.  3.  Mixed hyperlipidemia, recent LDL 57.  Currently on Crestor.  4.  Essential hypertension, recent systolics 100-130.  5.  Morbid obesity, at risk for OSA.  Patient reporting weakness this morning, no chest pain or shortness of breath, not hypoglycemic.  Feels somewhat better after eating breakfast.  Hemoglobin is normal, renal function also stable.  No arrhythmia by telemetry.  Check follow-up ECG.  Would observe with ambulation.  She states that she would like to try and go home, possible later this afternoon if she is stable and feels better.  In that case would continue aspirin, Brilinta, Lopressor, Crestor, Aldactone,  and convert to Lasix 80 mg daily with KCl 10 mEq daily.  Do not resume ARB.  Would recommend follow-up visit in 7 to 10 days with BMET with Dr. Jens Som or APP.  Signed, Nona Dell, MD  04/02/2020, 8:12 AM

## 2020-04-03 ENCOUNTER — Other Ambulatory Visit: Payer: Self-pay | Admitting: Internal Medicine

## 2020-04-03 DIAGNOSIS — E782 Mixed hyperlipidemia: Secondary | ICD-10-CM

## 2020-04-03 DIAGNOSIS — E1169 Type 2 diabetes mellitus with other specified complication: Secondary | ICD-10-CM

## 2020-04-03 DIAGNOSIS — I25119 Atherosclerotic heart disease of native coronary artery with unspecified angina pectoris: Secondary | ICD-10-CM

## 2020-04-03 DIAGNOSIS — I10 Essential (primary) hypertension: Secondary | ICD-10-CM

## 2020-04-03 DIAGNOSIS — J9601 Acute respiratory failure with hypoxia: Secondary | ICD-10-CM

## 2020-04-03 LAB — MAGNESIUM: Magnesium: 2.7 mg/dL — ABNORMAL HIGH (ref 1.7–2.4)

## 2020-04-03 LAB — CBC WITH DIFFERENTIAL/PLATELET
Abs Immature Granulocytes: 0.05 10*3/uL (ref 0.00–0.07)
Basophils Absolute: 0 10*3/uL (ref 0.0–0.1)
Basophils Relative: 0 %
Eosinophils Absolute: 0.2 10*3/uL (ref 0.0–0.5)
Eosinophils Relative: 2 %
HCT: 40.4 % (ref 36.0–46.0)
Hemoglobin: 12.5 g/dL (ref 12.0–15.0)
Immature Granulocytes: 0 %
Lymphocytes Relative: 18 %
Lymphs Abs: 2 10*3/uL (ref 0.7–4.0)
MCH: 24.4 pg — ABNORMAL LOW (ref 26.0–34.0)
MCHC: 30.9 g/dL (ref 30.0–36.0)
MCV: 78.8 fL — ABNORMAL LOW (ref 80.0–100.0)
Monocytes Absolute: 0.7 10*3/uL (ref 0.1–1.0)
Monocytes Relative: 6 %
Neutro Abs: 8.6 10*3/uL — ABNORMAL HIGH (ref 1.7–7.7)
Neutrophils Relative %: 74 %
Platelets: 251 10*3/uL (ref 150–400)
RBC: 5.13 MIL/uL — ABNORMAL HIGH (ref 3.87–5.11)
RDW: 17.1 % — ABNORMAL HIGH (ref 11.5–15.5)
WBC: 11.5 10*3/uL — ABNORMAL HIGH (ref 4.0–10.5)
nRBC: 0 % (ref 0.0–0.2)

## 2020-04-03 LAB — BASIC METABOLIC PANEL
Anion gap: 13 (ref 5–15)
BUN: 31 mg/dL — ABNORMAL HIGH (ref 6–20)
CO2: 30 mmol/L (ref 22–32)
Calcium: 9.3 mg/dL (ref 8.9–10.3)
Chloride: 95 mmol/L — ABNORMAL LOW (ref 98–111)
Creatinine, Ser: 1.4 mg/dL — ABNORMAL HIGH (ref 0.44–1.00)
GFR, Estimated: 46 mL/min — ABNORMAL LOW (ref >60)
Glucose, Bld: 129 mg/dL — ABNORMAL HIGH (ref 70–99)
Potassium: 4 mmol/L (ref 3.5–5.1)
Sodium: 138 mmol/L (ref 135–145)

## 2020-04-03 LAB — GLUCOSE, CAPILLARY
Glucose-Capillary: 138 mg/dL — ABNORMAL HIGH (ref 70–99)
Glucose-Capillary: 139 mg/dL — ABNORMAL HIGH (ref 70–99)
Glucose-Capillary: 159 mg/dL — ABNORMAL HIGH (ref 70–99)

## 2020-04-03 MED ORDER — TICAGRELOR 90 MG PO TABS: 90.0000 mg | ORAL_TABLET | Freq: Two times a day (BID) | ORAL | 0 refills | Status: DC

## 2020-04-03 MED ORDER — POTASSIUM CHLORIDE CRYS ER 10 MEQ PO TBCR
10.0000 meq | EXTENDED_RELEASE_TABLET | Freq: Every day | ORAL | Status: DC
  Administered 2020-04-03: 10 meq via ORAL
  Filled 2020-04-03: qty 1

## 2020-04-03 MED ORDER — POTASSIUM CHLORIDE CRYS ER 10 MEQ PO TBCR: 10.0000 meq | EXTENDED_RELEASE_TABLET | Freq: Every day | ORAL | 5 refills | Status: DC

## 2020-04-03 MED ORDER — FUROSEMIDE 80 MG PO TABS
80.0000 mg | ORAL_TABLET | Freq: Every day | ORAL | Status: DC
  Administered 2020-04-03: 80 mg via ORAL
  Filled 2020-04-03: qty 1

## 2020-04-03 MED ORDER — SPIRONOLACTONE 25 MG PO TABS: 25.0000 mg | ORAL_TABLET | Freq: Every day | ORAL | 5 refills | Status: DC

## 2020-04-03 MED ORDER — METOPROLOL SUCCINATE ER 50 MG PO TB24: 50.0000 mg | ORAL_TABLET | Freq: Every day | ORAL | 11 refills | Status: DC

## 2020-04-03 MED ORDER — CLOPIDOGREL BISULFATE 75 MG PO TABS: 75.0000 mg | ORAL_TABLET | Freq: Every day | ORAL | 11 refills | Status: DC

## 2020-04-03 MED ORDER — FUROSEMIDE 80 MG PO TABS: 80.0000 mg | ORAL_TABLET | Freq: Every day | ORAL | Status: DC

## 2020-04-03 MED ORDER — FUROSEMIDE 80 MG PO TABS: 80.0000 mg | ORAL_TABLET | Freq: Every day | ORAL | 5 refills | Status: DC

## 2020-04-03 MED ORDER — ROSUVASTATIN CALCIUM 20 MG PO TABS: 20.0000 mg | ORAL_TABLET | ORAL | 5 refills | Status: DC

## 2020-04-03 MED ORDER — CLOPIDOGREL BISULFATE 300 MG PO TABS: 300.0000 mg | ORAL_TABLET | Freq: Once | ORAL | 0 refills | Status: DC

## 2020-04-03 MED ORDER — ASPIRIN 81 MG PO CHEW: 81.0000 mg | CHEWABLE_TABLET | Freq: Every day | ORAL | Status: DC

## 2020-04-03 MED FILL — FUROSEMIDE 80 MG TAB: 80 | 30 days supply | Qty: 30 | Fill #0 | Status: TO

## 2020-04-03 MED FILL — BRILINTA 90 MG TABLET: 90 | 30 days supply | Qty: 60 | Fill #0

## 2020-04-03 MED FILL — CLOPIDOGREL 75 MG TABLET: 75 | 30 days supply | Qty: 30 | Fill #0 | Status: TO

## 2020-04-03 MED FILL — SPIRONOLACTONE 25 MG TABLET: 25 | 30 days supply | Qty: 30 | Fill #0 | Status: TO

## 2020-04-03 MED FILL — METOPROLOL SUCCINATE ER 50: 50 | 30 days supply | Qty: 30 | Fill #0 | Status: TO

## 2020-04-03 MED FILL — ROSUVASTATIN CALCIUM 20 MG: 20 | 30 days supply | Qty: 30 | Fill #0 | Status: TO

## 2020-04-03 MED FILL — POTASSIUM CHL ER M10 TABLET: 10 | 30 days supply | Qty: 30 | Fill #0 | Status: TO

## 2020-04-03 NOTE — Progress Notes (Addendum)
SATURATION QUALIFICATIONS: (This note is used to comply with regulatory documentation for home oxygen)  Patient Saturations on Room Air at Rest = 95%-97%  Patient Saturations on Room Air while Ambulating = 87%  Patient Saturations on 2 Liters of oxygen while Ambulating = 96%  Please briefly explain why patient needs home oxygen: Pt is dyspneic with activity. O2 dropped from upper 90's down to 87% after ambulating 24 feet w/ standard walker.

## 2020-04-03 NOTE — TOC Initial Note (Signed)
Transition of Care [facility]) - Initial/Assessment Note    Patient Details  Name: [name] MRN: [id number] Date of Birth: 08-13-1970  Transition of Care [facility]) CM/SW Contact:    Gala Lewandowsky, RN Phone Number: 04/03/2020, 11:13 AM  Clinical Narrative: Patient presented for shortness of breath and chest pain. Patient states she recently moved to [place]. Patient will return home today- medications will be delivered via [facility] to the bedside and refills sent to [facility]. Patient in need of oxygen- ordered via [employer] and has been delivered to the room for transport home. Patient asked about a home health aide- stating her PCP stated she needs one. Case Manager unable to add an aide without another discipline in the home. Case Manager did make the patient aware that if she uses her [facility], she will have a co pay for the aide each visit. If patient has Medicaid-patient will qualify for an aide going through DSS and her primary care provider. No further home needs identified at this time.                   Expected Discharge Plan: Home/Self Care Barriers to Discharge: No Barriers Identified   Patient Goals and CMS Choice Patient states their goals for this hospitalization and ongoing recovery are:: to return home      Expected Discharge Plan and Services Expected Discharge Plan: Home/Self Care In-house Referral: NA Discharge Planning Services: CM Consult Post Acute Care Choice: Durable Medical Equipment Living arrangements for the past 2 months: Single Family Home Expected Discharge Date: 04/03/20               DME Arranged: Oxygen DME Agency: [employer] Date DME Agency Contacted: 04/03/20 Time DME Agency Contacted: 0930 Representative spoke with at DME Agency: Trula Ore     Prior Living Arrangements/Services Living arrangements for the past 2 months: Single Family Home Lives with:: Significant Other Patient language and need for interpreter  reviewed:: Yes Do you feel safe going back to the place where you live?: Yes      Need for Family Participation in Patient Care: Yes (Comment) Care giver support system in place?: Yes (comment)   Criminal Activity/Legal Involvement Pertinent to Current Situation/Hospitalization: No - Comment as needed  Activities of Daily Living Home Assistive Devices/Equipment: Walker (specify type),Eyeglasses,Wheelchair ADL Screening (condition at time of admission) Patient's cognitive ability adequate to safely complete daily activities?: Yes Is the patient deaf or have difficulty hearing?: No Does the patient have difficulty seeing, even when wearing glasses/contacts?: No Does the patient have difficulty concentrating, remembering, or making decisions?: No Patient able to express need for assistance with ADLs?: Yes Does the patient have difficulty dressing or bathing?: No Independently performs ADLs?: No Communication: Independent Dressing (OT): Needs assistance Is this a change from baseline?: Pre-admission baseline Grooming: Needs assistance Is this a change from baseline?: Pre-admission baseline Feeding: Independent Bathing: Needs assistance Is this a change from baseline?: Pre-admission baseline Toileting: Needs assistance Is this a change from baseline?: Pre-admission baseline In/Out Bed: Needs assistance Is this a change from baseline?: Pre-admission baseline Walks in Home: Needs assistance Is this a change from baseline?: Pre-admission baseline Does the patient have difficulty walking or climbing stairs?: Yes Weakness of Legs: Both Weakness of Arms/Hands: None  Permission Sought/Granted Permission sought to share information with : Family Environmental health practitioner       Permission granted to share info w AGENCY: [employer]        Emotional Assessment Appearance::  Appears stated age Attitude/Demeanor/Rapport: Engaged Affect (typically observed):  Appropriate Orientation: : Oriented to Self,Oriented to Place,Oriented to  Time,Oriented to Situation Alcohol / Substance Use: Not Applicable Psych Involvement: No (comment)  Admission diagnosis:  Shortness of breath [R06.02] Dyspnea on exertion [R06.00] NSTEMI (non-ST elevated myocardial infarction) (HCC) [I21.4] Congestive heart failure, unspecified HF chronicity, unspecified heart failure type (HCC) [I50.9] Patient Active Problem List   Diagnosis Date Noted  . Constipation 04/02/2020  . Acute respiratory failure with hypoxia (HCC) 04/02/2020  . CAD (coronary artery disease) 03/31/2020  . Acute on chronic congestive heart failure (HCC) 03/28/2020  . Essential hypertension 03/28/2020  . GERD without esophagitis 03/28/2020  . Uncontrolled type 2 diabetes mellitus with hyperglycemia, with long-term current use of insulin (HCC) 03/28/2020  . Mixed hyperlipidemia [name] to type 2 diabetes mellitus (HCC) 03/28/2020  . Hypothyroidism 03/28/2020  . Major depressive disorder 03/28/2020  . Perirectal abscess 06/21/2014   PCP:  Patient, No Pcp Per Pharmacy:   [facility] [place], [place] - [address] [place] [address] Phone: [phone] Fax: [phone]  [facility] [address] [place]), [place] - [address] [place] [address] Phone: [phone] Fax: [phone]   Social Determinants of Health (SDOH) Interventions Food Insecurity Interventions: Intervention Not Indicated Financial Strain Interventions: Other (Comment) (looping in inpt SW and HF SW) Housing Interventions: Other (Comment) (referred to inpt HF SW) Transportation Interventions: Intervention Not Indicated (boyfriend drives and has vehicle. Pt does not drive.) Alcohol Brief Interventions/Follow-up: AUDIT Score <7 follow-up not indicated  Readmission Risk Interventions No flowsheet data found.

## 2020-04-03 NOTE — Discharge Summary (Signed)
Physician Discharge Summary   [name] RCV:[id number] DOB: 1970-12-06 DOA: 03/28/2020  PCP: Patient, No Pcp Per  Admit date: 03/28/2020 Discharge date: 04/03/2020   Admitted From: home Disposition:  home Discharging physician: Lewie Chamber, MD  Recommendations for Outpatient Follow-up:  1. Repeat BMP in 1 week 2. Follow up with cardiology  3. Adjust BP regimen and/or insulin regimen 4. Review plan to transition to Plavix after 1 month Brilinta  5. Follow up myalgias and if tolerating Crestor   Equipment/Devices: Home O2 arranged, 2L  Patient discharged to Home in Discharge Condition: stable Risk of unplanned readmission score: Unplanned Admission- Pilot do not use: 15.8  CODE STATUS: Full Diet recommendation:  Diet Orders (From admission, onward)    Start     Ordered   04/03/20 0000  Diet - low sodium heart healthy        04/03/20 1013   03/28/20 1833  Diet Carb Modified Fluid consistency: Thin; Room service appropriate? Yes  Diet effective now       Question Answer Comment  Diet-HS Snack? Nothing   Calorie Level Medium 1600-2000   Fluid consistency: Thin   Room service appropriate? Yes      03/28/20 1832          Hospital Course: [name] is a 50 yo female with PMH morbid obesity, IDDM, HTN, hypothyroidism, depression, GERD who presented to the hospital with worsening shortness of breath and chest pain.  She described worsening orthopnea and PND.  She underwent further work-up and was found to have elevated troponins and wall motion abnormalities on echo.  She was classified as NSTEMI and taken urgently to the Cath Lab on 03/28/2020.  She underwent placement of 1 DES to the mid LAD and had improvement post procedure in her chest pain. She still had ongoing shortness of breath which was considered being contributed from underlying low normal LV function and was started on diuresis with IV Lasix.  See below for further problem-based plan.   * NSTEMI (non-ST elevated  myocardial infarction) (HCC)-resolved as of 04/01/2020 - trops not fully trended to peak but escalated up to 2226. - now s/p LHC on 3/1 and underwent 1 DES to mid LAD (95% stenosis) - continue asa/brilinta. Copay for Brilinta is $412; even with prescription assistance, will still be unaffordable.  Tentative plan will be for 1 month Brilinta with free co-pay card then transition to Plavix to finish out therapy - remains CP free; anxiety also improving  -Prescriptions provided at discharge  Acute respiratory failure with hypoxia (HCC) -Has been requiring oxygen during hospitalization.  Also desatted on walk test.  Have been weaning as able.  Etiology considered due to underlying vascular congestion/volume overload -Has been improving some with ongoing Lasix -Oxygen arranged at time of discharge -will also need outpatient sleep study which patient was informed about  CAD (coronary artery disease) - s/p DES to mid LAD on 03/28/20 - continue asa/bilinta for 1 month then changing to asa/plavix with plavix load (scripts given at d/c) - patient gets myalgia with statins (was on low dose lipitor PTA three times weekly), she developed myalgia with daily lipitor 80mg  inpatient; at discharge prescribed Crestor 20 mg three times weekly and instructed to increase to daily as she can tolerate - ARB discontinued due to hypotension - see NSTEMI - transitioned lopressor to Toprol at discharge   Acute on chronic congestive heart failure (HCC) - elevated LVEDP during cath, 27 mmHg - echo on 3/1 showed EF 50-55% EF;  read as low/normal function per cardiology with WMAs  -Treated with IV Lasix with good diuretic response during hospitalization.  Slight increase in renal function prior to discharge therefore Lasix will be held for 1 day and resumed after discharge on 04/05/2020 -Continue Lasix 80 mg p.o. daily and KCl 10 mEq daily - continued on spironolactone also  - outpatient BMP recommended within 1 week at  discharge   Uncontrolled type 2 diabetes mellitus with hyperglycemia, with long-term current use of insulin (HCC) - A1c 7.7% on 03/28/20; prior A1c noted 11.7% on 12/29/19 - on very large doses of 70/30 outpatient; raises some concern for unintentional administration in scar tissues, etc -Has been starting to feel lethargic/weak and describing symptoms of hypoglycemia that she experiences at home.  She has been on lower doses of insulin during hospitalization than her home doses; also raising concern for probable noncompliance with diet at home as she has been on carb consistent diet while in-house -Decrease NovoLog 70/30 down to 90 units twice daily and hold as needed for hypoglycemic symptoms; resumed on home regimen but patient aware to hold if hypoglycemic symptoms and try to monitor diet being followed   Major depressive disorder -No chronic home meds -Ativan as needed during hospitalization.  Not continued at discharge  Hypothyroidism - TSH 5.423, FT4 1.02 (normal) and TT3 168 (normal) -Continue Synthroid  Mixed hyperlipidemia due to type 2 diabetes mellitus (HCC) -Continue Lipitor which has been increased to max dose and she then developed severe myalgias on 03/31/20 - d/c lipitor - start crestor at higher dose than she was on lipitor at home and we'll start with 3 times weekly then can be adjusted further outpatient  GERD without esophagitis -Continue Protonix  Essential hypertension - BP soft am of 3/4 - d/c losartan per cardiology - BP stable on lasix, spirono, lopressor  Constipation-resolved as of 04/03/2020 - resolved with laxatives inpatient    The patient's chronic medical conditions were treated accordingly per the patient's home medication regimen except as noted.  On day of discharge, patient was felt deemed stable for discharge. Patient/family member advised to call PCP or come back to ER if needed.   Principal Diagnosis: NSTEMI (non-ST elevated myocardial infarction)  [facility](HCC)  Discharge Diagnoses: Active Hospital Problems   Diagnosis Date Noted  . Acute respiratory failure with hypoxia (HCC) 04/02/2020    Priority: Medium  . CAD (coronary artery disease) 03/31/2020    Priority: Medium  . Acute on chronic congestive heart failure (HCC) 03/28/2020    Priority: Medium  . Uncontrolled type 2 diabetes mellitus with hyperglycemia, with long-term current use of insulin (HCC) 03/28/2020    Priority: Low  . Essential hypertension 03/28/2020  . GERD without esophagitis 03/28/2020  . Mixed hyperlipidemia due to type 2 diabetes mellitus (HCC) 03/28/2020  . Hypothyroidism 03/28/2020  . Major depressive disorder 03/28/2020    Resolved Hospital Problems   Diagnosis Date Noted Date Resolved  . NSTEMI (non-ST elevated myocardial infarction) (HCC)  04/01/2020    Priority: High  . Constipation 04/02/2020 04/03/2020    Discharge Instructions    Amb Referral to Cardiac Rehabilitation   Complete by: As directed    Diagnosis:  Coronary Stents NSTEMI PTCA     After initial evaluation and assessments completed: Virtual Based Care may be provided alone or in conjunction with Phase 2 Cardiac Rehab based on patient barriers.: Yes   Diet - low sodium heart healthy   Complete by: As directed    Increase activity slowly  Complete by: As directed    No wound care   Complete by: As directed      Allergies as of 04/03/2020      Reactions   Clindamycin/lincomycin Other (See Comments), Nausea And Vomiting   Stomach pain   Latex Rash   Ultram [tramadol] Rash      Medication List    STOP taking these medications   atenolol 50 MG tablet Commonly known as: TENORMIN   atorvastatin 20 MG tablet Commonly known as: LIPITOR   doxycycline 50 MG capsule Commonly known as: VIBRAMYCIN   HumaLOG 100 UNIT/ML injection Generic drug: insulin lispro   Lantus 100 UNIT/ML injection Generic drug: insulin glargine   losartan 50 MG tablet Commonly known as: COZAAR    omeprazole 40 MG capsule Commonly known as: PRILOSEC   oxyCODONE-acetaminophen 5-325 MG tablet Commonly known as: PERCOCET/ROXICET   promethazine 12.5 MG tablet Commonly known as: PHENERGAN   sitaGLIPtin-metformin 50-1000 MG tablet Commonly known as: JANUMET     TAKE these medications   aspirin 81 MG chewable tablet Chew 1 tablet (81 mg total) by mouth daily. Start taking on: April 04, 2020   clopidogrel 300 MG Tabs tablet Commonly known as: Plavix Take 1 tablet (300 mg total) by mouth once for 1 dose. Start taking on: May 05, 2020   clopidogrel 75 MG tablet Commonly known as: Plavix Take 1 tablet (75 mg total) by mouth daily. Do not start until Brilinta 30 day course complete Start taking on: May 06, 2020   furosemide 80 MG tablet Commonly known as: LASIX Take 1 tablet (80 mg total) by mouth daily. Start taking on: April 05, 2020   gabapentin 300 MG capsule Commonly known as: NEURONTIN Take 600 mg by mouth at bedtime.   levothyroxine 50 MCG tablet Commonly known as: SYNTHROID Take 50 mcg by mouth daily.   metFORMIN 1000 MG tablet Commonly known as: GLUCOPHAGE Take 1,000 mg by mouth 2 (two) times daily.   metoprolol succinate 50 MG 24 hr tablet Commonly known as: Toprol XL Take 1 tablet (50 mg total) by mouth daily. Take with or immediately following a meal.   NovoLIN 70/30 ReliOn (70-30) 100 UNIT/ML injection Generic drug: insulin NPH-regular Human Inject 125-135 Units into the skin See admin instructions. Use 125 units every morning and use 135 units every evening   potassium chloride 10 MEQ tablet Commonly known as: KLOR-CON Take 1 tablet (10 mEq total) by mouth daily. Start taking on: April 04, 2020   rosuvastatin 20 MG tablet Commonly known as: CRESTOR Take 1 tablet (20 mg total) by mouth 3 (three) times a week. Increase to daily as able to tolerate Start taking on: April 05, 2020   spironolactone 25 MG tablet Commonly known as: ALDACTONE Take 1  tablet (25 mg total) by mouth daily. Start taking on: April 04, 2020   ticagrelor 90 MG Tabs tablet Commonly known as: BRILINTA Take 1 tablet (90 mg total) by mouth 2 (two) times daily.            Durable Medical Equipment  (From admission, onward)         Start     Ordered   04/03/20 0827  For home use only DME oxygen  Once       Question Answer Comment  Length of Need 6 Months   Mode or (Route) Nasal cannula   Liters per Minute 2   Frequency Continuous (stationary and portable oxygen unit needed)   Oxygen delivery system  Gas      04/03/20 0826          Follow-up Information    Elizabeth Palau, FNP Follow up.   Specialty: Nurse Practitioner Why: Ask for BMP check within 1 week of discharge Contact information: [address] [place] [address] [phone]        [facility] Follow up.   Why: Oxygen- portable tank to be delivered to the room prior to transition home.  Contact information: [address] [place] [address] [phone]        [facility]. Go on 04/06/2020.   Specialty: Cardiology Why: AT 2PM, Heart Impact/ H&V TOC. bring all medications and pill box with you. FREE valet parking at [employer] ([address] You will see a Heart Failure provider, pharmacist and Child psychotherapist.  please wear a mask, your significant other is welcome! Contact information: [address] [id number] mc [place] [address] [phone]             Allergies  Allergen Reactions  . Clindamycin/Lincomycin Other (See Comments) and Nausea And Vomiting    Stomach pain   . Latex Rash  . Ultram [Tramadol] Rash    Consultations: Cardiology  Discharge Exam: BP 128/68 (BP Location: Left Arm)   Pulse 80   Temp 97.8 F (36.6 C) (Axillary)   Resp 18   Ht  (1.676 m)   Wt (!) 164.8 kg   LMP 12/07/2019 Comment: " beginning of november "  SpO2 96%    BMI 58.64 kg/m  General appearance: alert, cooperative and no distress Head: Normocephalic, without obvious abnormality, atraumatic Eyes: EOMI Lungs: clear to auscultation bilaterally Heart: regular rate and rhythm and S1, S2 normal Abdomen: obese, soft, NT, BS distant and present Extremities: 3+ LE edema worse in LLE Skin: mobility and turgor normal Neurologic: Grossly normal  The results of significant diagnostics from this hospitalization (including imaging, microbiology, ancillary and laboratory) are listed below for reference.   Microbiology: Recent Results (from the past 240 hour(s))  Resp Panel by RT-PCR (Flu A&B, Covid) Nasopharyngeal Swab     Status: None   Collection Time: 03/28/20  6:07 AM   Specimen: Nasopharyngeal Swab; Nasopharyngeal(NP) swabs in vial transport medium  Result Value Ref Range Status   SARS Coronavirus 2 by RT PCR NEGATIVE NEGATIVE Final    Comment: (NOTE) SARS-CoV-2 target nucleic acids are NOT DETECTED.  The SARS-CoV-2 RNA is generally detectable in upper respiratory specimens during the acute phase of infection. The lowest concentration of SARS-CoV-2 viral copies this assay can detect is 138 copies/mL. A negative result does not preclude SARS-Cov-2 infection and should not be used as the sole basis for treatment or other patient management decisions. A negative result may occur with  improper specimen collection/handling, submission of specimen other than nasopharyngeal swab, presence of viral mutation(s) within the areas targeted by this assay, and inadequate number of viral copies(<138 copies/mL). A negative result must be combined with clinical observations, patient history, and epidemiological information. The expected result is Negative.  Fact Sheet for Patients:  BloggerCourse.com  Fact Sheet for Healthcare Providers:  SeriousBroker.it  This test is no t yet approved or cleared by the  [place] FDA and  has been authorized for detection and/or diagnosis of SARS-CoV-2 by FDA under an Emergency Use Authorization (EUA). This EUA will remain  in effect (meaning this test can be used) for the duration of the COVID-19  declaration under Section 564(b)(1) of the Act, 21 U.S.C.section 360bbb-3(b)(1), unless the authorization is terminated  or revoked sooner.       Influenza A by PCR NEGATIVE NEGATIVE Final   Influenza B by PCR NEGATIVE NEGATIVE Final    Comment: (NOTE) The Xpert Xpress SARS-CoV-2/FLU/RSV plus assay is intended as an aid in the diagnosis of influenza from Nasopharyngeal swab specimens and should not be used as a sole basis for treatment. Nasal washings and aspirates are unacceptable for Xpert Xpress SARS-CoV-2/FLU/RSV testing.  Fact Sheet for Patients: BloggerCourse.com  Fact Sheet for Healthcare Providers: SeriousBroker.it  This test is not yet approved or cleared by the [place] FDA and has been authorized for detection and/or diagnosis of SARS-CoV-2 by FDA under an Emergency Use Authorization (EUA). This EUA will remain in effect (meaning this test can be used) for the duration of the COVID-19 declaration under Section 564(b)(1) of the Act, 21 U.S.C. section 360bbb-3(b)(1), unless the authorization is terminated or revoked.  Performed at [facility], [address], [place], [place] [address]      Labs: BNP (last 3 results) Recent Labs    03/28/20 0506  BNP 92.3   Basic Metabolic Panel: Recent Labs  Lab 03/30/20 0308 03/31/20 0314 04/01/20 0328 04/02/20 0133 04/03/20 0240  NA 138 137 137 139 138  K 3.6 4.1 4.2 3.7 4.0  CL 97* 97* 97* 95* 95*  CO2 GLUCOSE 229* 252* 148* 116* 129*  BUN 19 23* 21* 28* 31*  CREATININE 1.27* 1.26* 1.22* 1.27* 1.40*  CALCIUM 9.1 9.1 9.0 9.1 9.3  MG 1.8 2.0 2.1 2.2 2.7*   Liver Function Tests: No results for input(s):  AST, ALT, ALKPHOS, BILITOT, PROT, ALBUMIN in the last 168 hours. No results for input(s): LIPASE, AMYLASE in the last 168 hours. No results for input(s): AMMONIA in the last 168 hours. CBC: Recent Labs  Lab 03/30/20 0308 03/31/20 0314 04/01/20 0328 04/02/20 0133 04/03/20 0240  WBC 9.7 10.9* 11.8* 12.2* 11.5*  NEUTROABS  --   --  8.7* 8.9* 8.6*  HGB 11.5* 11.9* 11.8* 12.3 12.5  HCT 37.1 40.4 40.1 41.1 40.4  MCV 78.9* 80.6 80.4 80.3 78.8*  PLT 239 241 239 228 251   Cardiac Enzymes: No results for input(s): CKTOTAL, CKMB, CKMBINDEX, TROPONINI in the last 168 hours. BNP: Invalid input(s): POCBNP CBG: Recent Labs  Lab 04/02/20 2116 04/02/20 2352 04/03/20 0613 04/03/20 0715 04/03/20 1106  GLUCAP 198* 186* 138* 139* 159*   D-Dimer No results for input(s): DDIMER in the last 72 hours. Hgb A1c No results for input(s): HGBA1C in the last 72 hours. Lipid Profile No results for input(s): CHOL, HDL, LDLCALC, TRIG, CHOLHDL, LDLDIRECT in the last 72 hours. Thyroid function studies No results for input(s): TSH, T4TOTAL, T3FREE, THYROIDAB in the last 72 hours.  Invalid input(s): FREET3 Anemia work up No results for input(s): VITAMINB12, FOLATE, FERRITIN, TIBC, IRON, RETICCTPCT in the last 72 hours. Urinalysis No results found for: COLORURINE, APPEARANCEUR, LABSPEC, PHURINE, GLUCOSEU, HGBUR, BILIRUBINUR, KETONESUR, PROTEINUR, UROBILINOGEN, NITRITE, LEUKOCYTESUR Sepsis Labs Invalid input(s): PROCALCITONIN,  WBC,  LACTICIDVEN Microbiology Recent Results (from the past 240 hour(s))  Resp Panel by RT-PCR (Flu A&B, Covid) Nasopharyngeal Swab     Status: None   Collection Time: 03/28/20  6:07 AM   Specimen: Nasopharyngeal Swab; Nasopharyngeal(NP) swabs in vial transport medium  Result Value Ref Range Status   SARS Coronavirus 2 by RT PCR NEGATIVE NEGATIVE Final  Comment: (NOTE) SARS-CoV-2 target nucleic acids are NOT DETECTED.  The SARS-CoV-2 RNA is generally detectable in upper  respiratory specimens during the acute phase of infection. The lowest concentration of SARS-CoV-2 viral copies this assay can detect is 138 copies/mL. A negative result does not preclude SARS-Cov-2 infection and should not be used as the sole basis for treatment or other patient management decisions. A negative result may occur with  improper specimen collection/handling, submission of specimen other than nasopharyngeal swab, presence of viral mutation(s) within the areas targeted by this assay, and inadequate number of viral copies(<138 copies/mL). A negative result must be combined with clinical observations, patient history, and epidemiological information. The expected result is Negative.  Fact Sheet for Patients:  BloggerCourse.com  Fact Sheet for Healthcare Providers:  SeriousBroker.it  This test is no t yet approved or cleared by the [place] FDA and  has been authorized for detection and/or diagnosis of SARS-CoV-2 by FDA under an Emergency Use Authorization (EUA). This EUA will remain  in effect (meaning this test can be used) for the duration of the COVID-19 declaration under Section 564(b)(1) of the Act, 21 U.S.C.section 360bbb-3(b)(1), unless the authorization is terminated  or revoked sooner.       Influenza A by PCR NEGATIVE NEGATIVE Final   Influenza B by PCR NEGATIVE NEGATIVE Final    Comment: (NOTE) The Xpert Xpress SARS-CoV-2/FLU/RSV plus assay is intended as an aid in the diagnosis of influenza from Nasopharyngeal swab specimens and should not be used as a sole basis for treatment. Nasal washings and aspirates are unacceptable for Xpert Xpress SARS-CoV-2/FLU/RSV testing.  Fact Sheet for Patients: BloggerCourse.com  Fact Sheet for Healthcare Providers: SeriousBroker.it  This test is not yet approved or cleared by the [place] FDA and has been  authorized for detection and/or diagnosis of SARS-CoV-2 by FDA under an Emergency Use Authorization (EUA). This EUA will remain in effect (meaning this test can be used) for the duration of the COVID-19 declaration under Section 564(b)(1) of the Act, 21 U.S.C. section 360bbb-3(b)(1), unless the authorization is terminated or revoked.  Performed at [facility], [address], [place], [place] [address]     Procedures/Studies: DG Chest 2 View  Result Date: 03/28/2020 CLINICAL DATA:  Shortness of breath EXAM: CHEST - 2 VIEW COMPARISON:  None available FINDINGS: Cardiomegaly and central vascular congestion. There is no edema, consolidation, effusion, or pneumothorax. IMPRESSION: Cardiomegaly and vascular congestion. Electronically Signed   By: Marnee Spring M.D.   On: 03/28/2020 04:54   CARDIAC CATHETERIZATION  Result Date: 03/28/2020  Dist RCA lesion is 25% stenosed.  Mid LAD-1 lesion is 95% stenosed.  A drug-eluting stent was successfully placed using a STENT RESOLUTE ONYX [id number]. The proximal stent was postdilated with a 3.75 mm [place] baloon.  Post intervention, there is a 0% residual stenosis.  Mid LAD-2 lesion is 50% stenosed.  LV end diastolic pressure is moderately elevated. LVEDP 27 mm Hg.  There is no aortic valve stenosis.  Continue dual antiplatelet therapy for 12 months. Consider clopidogrel monotherapy after 12 months, along with aggressive secondary prevention.  Increase statin to high potency.  Continue with plans for diuresis.  D/w Dr. Jens Som.   ECHOCARDIOGRAM COMPLETE  Result Date: 03/28/2020    ECHOCARDIOGRAM REPORT   Patient Name:   [name] Date of Exam: 03/28/2020 Medical Rec #:  [id number]       Height:       66.0 in Accession #:    [id number]  Weight:       355.0 lb Date of Birth:  1970/11/27        BSA:          2.552 m Patient Age:    49 years        BP:           107/60 mmHg Patient Gender: F               HR:           88 bpm. Exam Location:   Inpatient Procedure: 2D Echo, Cardiac Doppler, Color Doppler and Intracardiac            Opacification Agent Indications:    I50.9* Heart failure (unspecified)  History:        Patient has no prior history of Echocardiogram examinations.                 Risk Factors:Morbid obesity. Fluid overload.  Sonographer:    Roosvelt Maser Referring Phys: [id number] Deno Lunger SHALHOUB IMPRESSIONS  1. Overall poor image quality even with definity Septal and apical hypokinesis . Left ventricular ejection fraction, by estimation, is 50 to 55%. The left ventricle has low normal function. The left ventricle has no regional wall motion abnormalities. Left ventricular diastolic parameters were normal.  2. Right ventricular systolic function is normal. The right ventricular size is normal.  3. Left atrial size was mildly dilated.  4. The mitral valve is normal in structure. No evidence of mitral valve regurgitation. No evidence of mitral stenosis.  5. The aortic valve was not well visualized. Aortic valve regurgitation is not visualized. No aortic stenosis is present.  6. The inferior vena cava is normal in size with greater than 50% respiratory variability, suggesting right atrial pressure of 3 mmHg. FINDINGS  Left Ventricle: Overall poor image quality even with definity Septal and apical hypokinesis. Left ventricular ejection fraction, by estimation, is 50 to 55%. The left ventricle has low normal function. The left ventricle has no regional wall motion abnormalities. Definity contrast agent was given IV to delineate the left ventricular endocardial borders. The left ventricular internal cavity size was normal in size. There is no left ventricular hypertrophy. Left ventricular diastolic parameters were normal. Right Ventricle: The right ventricular size is normal. No increase in right ventricular wall thickness. Right ventricular systolic function is normal. Left Atrium: Left atrial size was mildly dilated. Right Atrium: Right atrial size  was normal in size. Pericardium: There is no evidence of pericardial effusion. Mitral Valve: The mitral valve is normal in structure. No evidence of mitral valve regurgitation. No evidence of mitral valve stenosis. Tricuspid Valve: The tricuspid valve is normal in structure. Tricuspid valve regurgitation is not demonstrated. No evidence of tricuspid stenosis. Aortic Valve: The aortic valve was not well visualized. Aortic valve regurgitation is not visualized. No aortic stenosis is present. Aortic valve mean gradient measures 4.0 mmHg. Aortic valve peak gradient measures 7.6 mmHg. Aortic valve area, by VTI measures 1.94 cm. Pulmonic Valve: The pulmonic valve was normal in structure. Pulmonic valve regurgitation is not visualized. No evidence of pulmonic stenosis. Aorta: The aortic root is normal in size and structure. Venous: The inferior vena cava is normal in size with greater than 50% respiratory variability, suggesting right atrial pressure of 3 mmHg. IAS/Shunts: No atrial level shunt detected by color flow Doppler.  LEFT VENTRICLE PLAX 2D LVIDd:         5.80 cm  Diastology LVIDs:  3.70 cm  LV e' medial:    7.51 cm/s LV PW:         1.00 cm  LV E/e' medial:  12.4 LV IVS:        1.00 cm  LV e' lateral:   8.49 cm/s LVOT diam:     1.80 cm  LV E/e' lateral: 11.0 LV SV:         50 LV SV Index:   20 LVOT Area:     2.54 cm  LEFT ATRIUM            Index LA diam:      4.20 cm  1.65 cm/m LA Vol (A4C): 122.0 ml 47.80 ml/m  AORTIC VALVE AV Area (Vmax):    1.84 cm AV Area (Vmean):   2.03 cm AV Area (VTI):     1.94 cm AV Vmax:           138.00 cm/s AV Vmean:          87.700 cm/s AV VTI:            0.259 m AV Peak Grad:      7.6 mmHg AV Mean Grad:      4.0 mmHg LVOT Vmax:         100.00 cm/s LVOT Vmean:        70.100 cm/s LVOT VTI:          0.197 m LVOT/AV VTI ratio: 0.76  AORTA Ao Root diam: 3.20 cm Ao Asc diam:  3.60 cm MITRAL VALVE MV Area (PHT): 7.66 cm    SHUNTS MV Decel Time: 99 msec     Systemic VTI:  0.20  m MV E velocity: 93.30 cm/s  Systemic Diam: 1.80 cm MV A velocity: 86.10 cm/s MV E/A ratio:  1.08 Charlton Haws MD Electronically signed by Charlton Haws MD Signature Date/Time: 03/28/2020/9:24:29 AM    Final      Time coordinating discharge: Over 30 minutes    Lewie Chamber, MD  [facility] 04/03/2020, 2:21 PM

## 2020-04-03 NOTE — Progress Notes (Signed)
Education Assessment and Provision:  Detailed education and instructions provided on heart failure disease management including the following:  Signs and symptoms of Heart Failure When to call the physician Importance of daily weights Low sodium diet Fluid restriction Medication management Anticipated future follow-up appointments  Patient education given on each of the above topics.  Patient acknowledges understanding via teach back method and acceptance of all instructions.  Education Materials:  "Living Better With Heart Failure" Booklet, HF zone tool, & Daily Weight Tracker Tool.  Patient has scale at home: will provide scale at H&V TOC appt, scheduled Thrusday 3/10 @ 1PM. Patient has pill box at home: yes, given from AHF clinic.   Items for Follow-up on DC/TOC: -PCP -financial strains -medication cost concerns -concern for safe/adequate self care at home. Many dependent ADL needs.  Ozella Rocks, RN, BSN Heart Failure Nurse Navigator [phone]

## 2020-04-03 NOTE — Progress Notes (Signed)
Progress Note  Patient Name: [name] Date of Encounter: 04/03/2020  [facility] Cardiologist: [name], MD   Subjective   Feeling better.  Still short of breath with ambulation but no longer has chest discomfort.  She was able to lay more flat in the bed last night.  Inpatient Medications    Scheduled Meds: . aspirin  81 mg Oral Daily  . furosemide  80 mg Oral Daily  . gabapentin  600 mg Oral QHS  . heparin  5,000 Units Subcutaneous Q8H  . influenza vac split quadrivalent PF  0.5 mL Intramuscular Tomorrow-1000  . insulin aspart  0-20 Units Subcutaneous TID WC  . insulin aspart  0-5 Units Subcutaneous QHS  . insulin aspart protamine- aspart  90 Units Subcutaneous BID WC  . levothyroxine  50 mcg Oral Q0600  . lidocaine  1 patch Transdermal Q24H  . mouth rinse  15 mL Mouth Rinse BID  . metoprolol tartrate  25 mg Oral BID  . nystatin   Topical TID  . pantoprazole  40 mg Oral Daily  . polyethylene glycol  17 g Oral Daily  . potassium chloride  10 mEq Oral Daily  . rosuvastatin  20 mg Oral Once per day on Mon Wed Fri  . senna-docusate  1 tablet Oral BID  . sodium chloride flush  3 mL Intravenous Q12H  . spironolactone  25 mg Oral Daily  . ticagrelor  90 mg Oral BID   Continuous Infusions: . sodium chloride     PRN Meds: sodium chloride, acetaminophen, LORazepam, magnesium hydroxide, ondansetron (ZOFRAN) IV, sodium chloride flush   Vital Signs    Vitals:   04/02/20 2048 04/02/20 2215 04/03/20 0256 04/03/20 0415  BP: 118/78 130/74  128/68  Pulse: 76 76  80  Resp: 18   18  Temp: 98.4 F (36.9 C)   97.8 F (36.6 C)  TempSrc: Oral   Axillary  SpO2: 98%   96%  Weight:   (!) 164.8 kg   Height:        Intake/Output Summary (Last 24 hours) at 04/03/2020 0903 Last data filed at 04/03/2020 0256 Gross per 24 hour  Intake 1083 ml  Output 3550 ml  Net -2467 ml   Last 3 Weights 04/03/2020 04/02/2020 04/01/2020  Weight (lbs) 363 lb 5.1 oz 365 lb 4.8 oz 364 lb   Weight (kg) 164.8 kg 165.7 kg 165.109 kg      Telemetry    Sinus rhythm.  No events- Personally Reviewed  ECG    N/A- Personally Reviewed  Physical Exam   VS:  BP 128/68 (BP Location: Left Arm)   Pulse 80   Temp 97.8 F (36.6 C) (Axillary)   Resp 18   Ht 5\' 6"  (1.676 m)   Wt (!) 164.8 kg   LMP 12/07/2019 Comment: " beginning of november "  SpO2 96%   BMI 58.64 kg/m  , BMI Body mass index is 58.64 kg/m. GENERAL:  Well appearing HEENT: Pupils equal round and reactive, fundi not visualized, oral mucosa unremarkable NECK:  Unable to assess JVP. waveform within normal limits, carotid upstroke brisk and symmetric, no bruits LUNGS:  Clear to auscultation bilaterally HEART:  RRR.  PMI not displaced or sustained,S1 and S2 within normal limits, no S3, no S4, no clicks, no rubs, no murmurs ABD:  Flat, positive bowel sounds normal in frequency in pitch, no bruits, no rebound, no guarding, no midline pulsatile mass, no hepatomegaly, no splenomegaly EXT:  2 plus pulses throughout, +  LE edema, no cyanosis no clubbing SKIN:  No rashes no nodules NEURO:  Cranial nerves II through XII grossly intact, motor grossly intact throughout PSYCH:  Cognitively intact, oriented to person place and time   Labs    High Sensitivity Troponin:   Recent Labs  Lab 03/28/20 0347 03/28/20 0606 03/28/20 1433 03/28/20 2016 03/29/20 0036  TROPONINIHS 514* 539* 1,302* 1,643* 2,226*      Chemistry Recent Labs  Lab 04/01/20 0328 04/02/20 0133 04/03/20 0240  NA 137 139 138  K 4.2 3.7 4.0  CL 97* 95* 95*  CO2 28 30 30   GLUCOSE 148* 116* 129*  BUN 21* 28* 31*  CREATININE 1.22* 1.27* 1.40*  CALCIUM 9.0 9.1 9.3  GFRNONAA 55* 52* 46*  ANIONGAP 12 14 13      Hematology Recent Labs  Lab 04/01/20 0328 04/02/20 0133 04/03/20 0240  WBC 11.8* 12.2* 11.5*  RBC 4.99 5.12* 5.13*  HGB 11.8* 12.3 12.5  HCT 40.1 41.1 40.4  MCV 80.4 80.3 78.8*  MCH 23.6* 24.0* 24.4*  MCHC 29.4* 29.9* 30.9  RDW  17.0* 16.9* 17.1*  PLT 239 228 251    BNP Recent Labs  Lab 03/28/20 0506  BNP 92.3     DDimer No results for input(s): DDIMER in the last 168 hours.   Radiology    No results found.  Cardiac Studies   Echocardiogram 03/28/2020: 1. Overall poor image quality even with definity Septal and apical  hypokinesis . Left ventricular ejection fraction, by estimation, is 50 to  55%. The left ventricle has low normal function. The left ventricle has no  regional wall motion abnormalities.  Left ventricular diastolic parameters were normal.  2. Right ventricular systolic function is normal. The right ventricular  size is normal.  3. Left atrial size was mildly dilated.  4. The mitral valve is normal in structure. No evidence of mitral valve  regurgitation. No evidence of mitral stenosis. 5. The aortic valve was not well visualized. Aortic valve regurgitation  is not visualized. No aortic stenosis is present.  6. The inferior vena cava is normal in size with greater than 50%  respiratory variability, suggesting right atrial pressure of 3 mmHg.  Cardiac catheterization 03/28/2020:  Dist RCA lesion is 25% stenosed.  Mid LAD-1 lesion is 95% stenosed.  A drug-eluting stent was successfully placed using a STENT RESOLUTE ONYX 05/28/2020. The proximal stent was postdilated with a 3.75 mm [address] baloon.  Post intervention, there is a 0% residual stenosis.  Mid LAD-2 lesion is 50% stenosed.  LV end diastolic pressure is moderately elevated. LVEDP 27 mm Hg.  There is no aortic valve stenosis.  Continue dual antiplatelet therapy for 12 months. Consider clopidogrel monotherapy after 12 months, along with aggressive secondary prevention.   Patient Profile     50 y.o. female with hypertension, hyperlipidemia, diabetes, OSA, and morbid obesity admitted with NSTEMI and acute diastolic heart failure.  Assessment & Plan    #NSTEMI: # Hyperlipidemia:  Status post mid LAD PCI.  She also had  25% distal RCA and 50% mid LAD lesions.  Continue aspirin, metoprolol, and ticagrelor.  She needs 12 months of dual antiplatelet therapy.  Continue rosuvastatin.  She needs lipids/CMP in 6-8 weeks.  She will benefit from cardiac rehab.  #Acute diastolic heart failure: Echo this admission revealed LVEF 50 to 55% with septal and apical hypokinesis.  LVEDP was 27 mmHg.  She was net -3.3 L yesterday. Weight today is down to 164.8 kg from 165.7 yesterday. Renal  function is slightly worse.  She was switched to oral Lasix today.  Would hold the dose tomorrow and have her resume it on Wednesday.  She will need a basic metabolic panel checked next week.  # Hypertension: BP stable on metoprolol and spironolactone.  # Morbid obesity: Cardiac rehab when stable. Aggressive weight loss interventions.    [facility] will sign off.   Medication Recommendations:  Hold lasix tomorrow and resume on 3/9 Other recommendations (labs, testing, etc):  Cardiac rehab as outpatient  Follow up as an outpatient:  We will arrange  For questions or updates, please contact [facility] Please consult [email or web address] for contact info under        Signed, Chilton Si, MD  04/03/2020, 9:03 AM

## 2020-04-03 NOTE — Plan of Care (Signed)
Problem: Education: Goal: Knowledge of General Education information will improve Description: Including pain rating scale, medication(s)/side effects and non-pharmacologic comfort measures 04/03/2020 1047 by Coy Saunas, RN Outcome: Adequate for Discharge 04/03/2020 1047 by Coy Saunas, RN Outcome: Adequate for Discharge   Problem: Health Behavior/Discharge Planning: Goal: Ability to manage health-related needs will improve 04/03/2020 1047 by Coy Saunas, RN Outcome: Adequate for Discharge 04/03/2020 1047 by Coy Saunas, RN Outcome: Adequate for Discharge   Problem: Clinical Measurements: Goal: Ability to maintain clinical measurements within normal limits will improve 04/03/2020 1047 by Coy Saunas, RN Outcome: Adequate for Discharge 04/03/2020 1047 by Coy Saunas, RN Outcome: Adequate for Discharge Goal: Will remain free from infection 04/03/2020 1047 by Coy Saunas, RN Outcome: Adequate for Discharge 04/03/2020 1047 by Coy Saunas, RN Outcome: Adequate for Discharge Goal: Diagnostic test results will improve 04/03/2020 1047 by Coy Saunas, RN Outcome: Adequate for Discharge 04/03/2020 1047 by Coy Saunas, RN Outcome: Adequate for Discharge Goal: Respiratory complications will improve 04/03/2020 1047 by Coy Saunas, RN Outcome: Adequate for Discharge 04/03/2020 1047 by Coy Saunas, RN Outcome: Adequate for Discharge Goal: Cardiovascular complication will be avoided 04/03/2020 1047 by Coy Saunas, RN Outcome: Adequate for Discharge 04/03/2020 1047 by Coy Saunas, RN Outcome: Adequate for Discharge   Problem: Activity: Goal: Risk for activity intolerance will decrease 04/03/2020 1047 by Coy Saunas, RN Outcome: Adequate for Discharge 04/03/2020 1047 by Coy Saunas, RN Outcome: Adequate for Discharge   Problem: Nutrition: Goal: Adequate nutrition will be maintained 04/03/2020 1047 by Coy Saunas, RN Outcome: Adequate for Discharge 04/03/2020 1047  by Coy Saunas, RN Outcome: Adequate for Discharge   Problem: Coping: Goal: Level of anxiety will decrease 04/03/2020 1047 by Coy Saunas, RN Outcome: Adequate for Discharge 04/03/2020 1047 by Coy Saunas, RN Outcome: Adequate for Discharge   Problem: Elimination: Goal: Will not experience complications related to bowel motility 04/03/2020 1047 by Coy Saunas, RN Outcome: Adequate for Discharge 04/03/2020 1047 by Coy Saunas, RN Outcome: Adequate for Discharge Goal: Will not experience complications related to urinary retention 04/03/2020 1047 by Coy Saunas, RN Outcome: Adequate for Discharge 04/03/2020 1047 by Coy Saunas, RN Outcome: Adequate for Discharge   Problem: Pain Managment: Goal: General experience of comfort will improve 04/03/2020 1047 by Coy Saunas, RN Outcome: Adequate for Discharge 04/03/2020 1047 by Coy Saunas, RN Outcome: Adequate for Discharge   Problem: Safety: Goal: Ability to remain free from injury will improve 04/03/2020 1047 by Coy Saunas, RN Outcome: Adequate for Discharge 04/03/2020 1047 by Coy Saunas, RN Outcome: Adequate for Discharge   Problem: Skin Integrity: Goal: Risk for impaired skin integrity will decrease 04/03/2020 1047 by Coy Saunas, RN Outcome: Adequate for Discharge 04/03/2020 1047 by Coy Saunas, RN Outcome: Adequate for Discharge   Problem: Education: Goal: Understanding of CV disease, CV risk reduction, and recovery process will improve 04/03/2020 1047 by Coy Saunas, RN Outcome: Adequate for Discharge 04/03/2020 1047 by Coy Saunas, RN Outcome: Adequate for Discharge Goal: Individualized Educational Video(s) 04/03/2020 1047 by Coy Saunas, RN Outcome: Adequate for Discharge 04/03/2020 1047 by Coy Saunas, RN Outcome: Adequate for Discharge   Problem: Activity: Goal: Ability to return to baseline activity level will improve 04/03/2020 1047 by Coy Saunas, RN Outcome: Adequate for  Discharge 04/03/2020 1047 by Coy Saunas, RN Outcome: Adequate for Discharge  Problem: Cardiovascular: Goal: Ability to achieve and maintain adequate cardiovascular perfusion will improve 04/03/2020 1047 by Coy Saunas, RN Outcome: Adequate for Discharge 04/03/2020 1047 by Coy Saunas, RN Outcome: Adequate for Discharge Goal: Vascular access site(s) Level 0-1 will be maintained 04/03/2020 1047 by Coy Saunas, RN Outcome: Adequate for Discharge 04/03/2020 1047 by Coy Saunas, RN Outcome: Adequate for Discharge   Problem: Health Behavior/Discharge Planning: Goal: Ability to safely manage health-related needs after discharge will improve 04/03/2020 1047 by Coy Saunas, RN Outcome: Adequate for Discharge 04/03/2020 1047 by Coy Saunas, RN Outcome: Adequate for Discharge

## 2020-04-03 NOTE — Progress Notes (Signed)
Pt sts she ambulated last night short distance in hall, which is good for her. Encouraged short walks very frequently at home. Reviewed daily wts, low sodium, CRPII, Brilinta. Pt sts she is feeling much better.  [id number] [name] CES, ACSM 11:05 AM 04/03/2020

## 2020-04-03 NOTE — Progress Notes (Addendum)
Heart Failure Stewardship Pharmacist Progress Note   PCP: Patient, No Pcp Per PCP-Cardiologist: [name], MD    HPI:  50 YO female with PMH of T2DM, HTN, HLD, OSA, and obesity. She presented to the ED on 03/28/20 for worsening SOB, orthopnea and chest pain. Cardiac workup revealed NSTEMI. Developed recurrent chest pain and was urgently taken to cath lab on 03/28/20 and underwent stent placement to the mid LAD. Patient reports symptom relief post procedure. ECHO completed on 03/28/20 with LVEF of 50-55%.   Discharge HF Medications: Furosemide 80 mg daily Metoprolol XL 50 mg daily Spironolactone 25 mg daily   Prior to admission HF Medications: Losartan 50 mg daily **has not been taking atenolol PTA  Pertinent Lab Values: . Serum creatinine 1.40, BUN 31, Potassium 4.0, Sodium 138, BNP 92.3, Magnesium 2.7  Vital Signs: . Weight: 363 lbs (admission weight: 380 lb) . Blood pressure: 120/60s . Heart rate: 70s   Medication Assistance / Insurance Benefits Check: Does the patient have prescription insurance?  Yes Type of insurance plan: [employer] - commercial    Outpatient Pharmacy:  Prior to admission outpatient pharmacy: [facility] Is the patient willing to use [facility] TOC pharmacy at discharge? Yes Is the patient willing to transition their outpatient pharmacy to utilize a [facility] outpatient pharmacy?   Pending    Assessment: 1. Acute on chronic systolic CHF (EF 10-27%). NYHA class II symptoms. - Continue furosemide 80 mg PO daily at discharge - Agree with transitioning to metoprolol XL 50 mg daily at discharge - Losartan 50 mg daily has been held with low BP and now bump in SCr. Attempt to restart vs optimize to [facility] after discharge Sherryll Burger indicated for LVEF of 57% and less) - Continue spironolactone 25 mg daily - Avoid SGLT-2i due to hygiene limitations identified by HF RN navigator   Plan: 1) Medication changes recommended at this time: - Discharge today - Attempt  to restart ARB vs start Entresto at d/c follow up  2) Patient assistance application(s): Test claims completed showing the following: -Entresto copay: $200/month - can use copay card which would allow for a $10 copay for up to 3 month supply  3)  Education  - Patient has been educated on current HF medications and potential additions to HF medication regimen  - Patient verbalizes understanding that over the next few months, these medication doses may change and more medications may be added to optimize HF regimen - Patient has been educated on basic disease state pathophysiology and goals of therapy  Sharen Hones, PharmD, BCPS Heart Failure Stewardship Pharmacist Phone [phone]

## 2020-04-06 ENCOUNTER — Ambulatory Visit (HOSPITAL_COMMUNITY)
Admit: 2020-04-06 | Discharge: 2020-04-06 | Disposition: A | Discharge status: Home / Self Care | Payer: 59 | Attending: Internal Medicine | Admitting: Internal Medicine

## 2020-04-06 ENCOUNTER — Other Ambulatory Visit: Payer: Self-pay

## 2020-04-06 ENCOUNTER — Telehealth (HOSPITAL_COMMUNITY): Payer: Self-pay

## 2020-04-06 VITALS — BP 124/64 | HR 77 | Wt 363.2 lb

## 2020-04-06 DIAGNOSIS — E039 Hypothyroidism, unspecified: Secondary | ICD-10-CM | POA: Insufficient documentation

## 2020-04-06 DIAGNOSIS — E119 Type 2 diabetes mellitus without complications: Secondary | ICD-10-CM | POA: Insufficient documentation

## 2020-04-06 DIAGNOSIS — I252 Old myocardial infarction: Secondary | ICD-10-CM | POA: Insufficient documentation

## 2020-04-06 DIAGNOSIS — Z596 Low income: Secondary | ICD-10-CM | POA: Insufficient documentation

## 2020-04-06 DIAGNOSIS — M791 Myalgia, unspecified site: Secondary | ICD-10-CM | POA: Diagnosis not present

## 2020-04-06 DIAGNOSIS — E1165 Type 2 diabetes mellitus with hyperglycemia: Secondary | ICD-10-CM | POA: Diagnosis not present

## 2020-04-06 DIAGNOSIS — F32A Depression, unspecified: Secondary | ICD-10-CM | POA: Diagnosis not present

## 2020-04-06 DIAGNOSIS — K219 Gastro-esophageal reflux disease without esophagitis: Secondary | ICD-10-CM | POA: Diagnosis not present

## 2020-04-06 DIAGNOSIS — Z8249 Family history of ischemic heart disease and other diseases of the circulatory system: Secondary | ICD-10-CM | POA: Insufficient documentation

## 2020-04-06 DIAGNOSIS — Z794 Long term (current) use of insulin: Secondary | ICD-10-CM | POA: Insufficient documentation

## 2020-04-06 DIAGNOSIS — I110 Hypertensive heart disease with heart failure: Secondary | ICD-10-CM | POA: Diagnosis present

## 2020-04-06 DIAGNOSIS — Z955 Presence of coronary angioplasty implant and graft: Secondary | ICD-10-CM | POA: Diagnosis not present

## 2020-04-06 DIAGNOSIS — Z79899 Other long term (current) drug therapy: Secondary | ICD-10-CM | POA: Insufficient documentation

## 2020-04-06 DIAGNOSIS — I5030 Unspecified diastolic (congestive) heart failure: Secondary | ICD-10-CM | POA: Diagnosis not present

## 2020-04-06 DIAGNOSIS — I5032 Chronic diastolic (congestive) heart failure: Secondary | ICD-10-CM | POA: Diagnosis not present

## 2020-04-06 DIAGNOSIS — I2510 Atherosclerotic heart disease of native coronary artery without angina pectoris: Secondary | ICD-10-CM | POA: Insufficient documentation

## 2020-04-06 DIAGNOSIS — Z7982 Long term (current) use of aspirin: Secondary | ICD-10-CM | POA: Diagnosis not present

## 2020-04-06 LAB — BASIC METABOLIC PANEL
Anion gap: 7 (ref 5–15)
BUN: 22 mg/dL — ABNORMAL HIGH (ref 6–20)
CO2: 31 mmol/L (ref 22–32)
Calcium: 8.8 mg/dL — ABNORMAL LOW (ref 8.9–10.3)
Chloride: 101 mmol/L (ref 98–111)
Creatinine, Ser: 1.06 mg/dL — ABNORMAL HIGH (ref 0.44–1.00)
GFR, Estimated: >60 mL/min (ref >60)
Glucose, Bld: 159 mg/dL — ABNORMAL HIGH (ref 70–99)
Potassium: 4.3 mmol/L (ref 3.5–5.1)
Sodium: 139 mmol/L (ref 135–145)

## 2020-04-06 NOTE — Patient Instructions (Signed)
Continue current medications  Thank you for allowing [place] to provider your heart failure care after your recent hospitalization. Please follow-up with [facility] on 04/21/20 at 3:15 pm  If you have any questions, issues, or concerns before your next appointment please call our office at [phone], opt. [phone] and leave a message for the triage nurse.  Do the following things EVERYDAY: 1) Weigh yourself in the morning before breakfast. Write it down and keep it in a log. 2) Take your medicines as prescribed 3) Eat low salt foods--Limit salt (sodium) to 2000 mg per day.  4) Stay as active as you can everyday 5) Limit all fluids for the day to less than 2 liters  Restrict your sodium intake to less than 2000mg  per day. This will help prevent your body from holding onto fluid. Read food labels as a lot of canned and packaged foods have a lot of sodium.  Limit your fluid intake to less than 2 liters of fluid per day. Fluid includes all drinks, coffee, juice, ice chips, soup, jello, and all other liquids.  Weigh yourself EVERY morning after you go to the bathroom but before you eat or drink anything. Write this number down in a weight log/diary. If you gain 3 pounds overnight or 5 pounds in a week, call the heart failure clinic

## 2020-04-06 NOTE — Telephone Encounter (Signed)
Called to confirm Heart & Vascular Transitions of Care appointment at [facility]. Patient reminded to bring all medications and pill box organizer with them. Confirmed patient has transportation. Gave directions, instructed to utilize valet parking.  Confirmed appointment prior to ending call.

## 2020-04-06 NOTE — Progress Notes (Addendum)
[facility]  PCP: No PCP Primary Cardiologist: [name]  HPI:  [name] is a 50 y.o.  female  with a PMH significant for morbid obesity, IDDM, HTN, hypothyroidism, depression, GERD  Mostly treated for diabetes related issues in the past.  Admitted on 03/28/20 with chest discomfort and dyspnea.  HS troponin elevated on presentation to 514 and then continued to increase up to over 2,000 and ecg with nonspecific changes.  She was diagnosed with NSTEMI, taken for LHC which demonstrated:   Dist RCA lesion is 25% stenosed.  Mid LAD-1 lesion is 95% stenosed.  A drug-eluting stent was successfully placed using a STENT RESOLUTE ONYX [id number]. The proximal stent was postdilated with a 3.75 mm [place] baloon.  Post intervention, there is a 0% residual stenosis.  Mid LAD-2 lesion is 50% stenosed.  LV end diastolic pressure is moderately elevated. LVEDP 27 mm Hg.  There is no aortic valve stenosis.  She also had an ECHO with normal EF, septal and apical hypokinesis, normal diastolic function, no valvular abnormalities, normal RV function  She has been feeling achy all over since discharge from the hospital.  She denies any current chest pain except when I had her take a deep breath she said that felt uncomfortable but resolved with exhalation.  Although she has limited mobility due to her weight she feels that overall she is breathing better.    Unfortunately when going over her medications it appears her pillbox is in disarray some days with multiple doses and others missing doses of certain medications she admits to having very poor vision and that a bottle poured in the tray and she tried to fix it the best she could.     ROS: All systems negative except as listed in HPI, PMH and Problem List.  SH:  Social History   Socioeconomic History  . Marital status: Single    Spouse name: Not on file  . Number of children: Not on file  . Years  of education: Not on file  . Highest education level: Not on file  Occupational History  . Not on file  Tobacco Use  . Smoking status: Never Smoker  . Smokeless tobacco: Never Used  Vaping Use  . Vaping Use: Never used  Substance and Sexual Activity  . Alcohol use: No  . Drug use: No  . Sexual activity: Not on file  Other Topics Concern  . Not on file  Social History Narrative  . Not on file   Social Determinants of Health   Financial Resource Strain: Medium Risk  . Difficulty of Paying Living Expenses: Somewhat hard  Food Insecurity: No Food Insecurity  . Worried About Programme researcher, broadcasting/film/video in the Last Year: Never true  . Ran Out of Food in the Last Year: Never true  Transportation Needs: No Transportation Needs  . Lack of Transportation (Medical): No  . Lack of Transportation (Non-Medical): No  Physical Activity: Inactive  . Days of Exercise per Week: 0 days  . Minutes of Exercise per Session: 0 min  Stress: Not on file  Social Connections: Not on file  Intimate Partner Violence: Not on file    FH:  Family History  Problem Relation Age of Onset  . Heart disease Neg Hx     Past Medical History:  Diagnosis Date  . DDD (degenerative disc disease), lumbar   . Diabetes mellitus without complication (HCC)   . Hypertension   . Lumbar herniated  disc     Current Outpatient Medications  Medication Sig Dispense Refill  . gabapentin (NEURONTIN) 300 MG capsule Take 600 mg by mouth at bedtime.  3  . levothyroxine (SYNTHROID, LEVOTHROID) 50 MCG tablet Take 50 mcg by mouth daily.  3  . metFORMIN (GLUCOPHAGE) 1000 MG tablet Take 1,000 mg by mouth 2 (two) times daily.    Marland Kitchen NOVOLIN 70/30 RELION (70-30) 100 UNIT/ML injection Inject 125-135 Units into the skin See admin instructions. Use 125 units every morning and use 135 units every evening    . omeprazole (PRILOSEC) 40 MG capsule Take 40 mg by mouth 2 (two) times daily.    Marland Kitchen aspirin 81 MG chewable tablet Chew 1 tablet (81 mg  total) by mouth daily.    Melene Muller ON 05/06/2020] clopidogrel (PLAVIX) 75 MG tablet Take 1 tablet (75 mg total) by mouth daily. Do not start until Brilinta 30 day course complete 30 tablet 11  . furosemide (LASIX) 80 MG tablet Take 1 tablet (80 mg total) by mouth daily. 30 tablet 5  . metoprolol succinate (TOPROL XL) 50 MG 24 hr tablet Take 1 tablet (50 mg total) by mouth daily. Take with or immediately following a meal. 30 tablet 11  . potassium chloride (KLOR-CON) 10 MEQ tablet Take 1 tablet (10 mEq total) by mouth daily. 30 tablet 5  . rosuvastatin (CRESTOR) 20 MG tablet Take 1 tablet (20 mg total) by mouth 3 (three) times a week. Increase to daily as able to tolerate 30 tablet 5  . spironolactone (ALDACTONE) 25 MG tablet Take 1 tablet (25 mg total) by mouth daily. 30 tablet 5  . ticagrelor (BRILINTA) 90 MG TABS tablet Take 1 tablet (90 mg total) by mouth 2 (two) times daily. 60 tablet 0   No current facility-administered medications for this encounter.    Vitals:   04/06/20 1412  BP: 124/64  Pulse: 77  SpO2: 100%  Weight: (!) 164.8 kg (363 lb 4 oz)    PHYSICAL EXAM: Cardiac: JVD difficult to assess due to habitus, normal rate and rhythm, clear s1 and s2, no murmurs, rubs or gallops, bilateral 2+ LE edema Pulmonary: CTAB, not in distress Abdominal: non distended abdomen, soft and nontender Psych: Alert, conversant, in good spirits   ECG   NSR rate 74, nonspecific ST changes stable compared to last ecg, no acute ischemia   ASSESSMENT & PLAN: Diastolic CHF: -ECHO with normal EF, septal and apical hypokinesis, normal diastolic function, no valvular abnormalities, normal RV function -LVEDP was elevated to in the cath lab -Diuresed in the hospital but weight remained relatively the same, she is the same weight today 363lbs -NYHA Class II although difficult to say due to body habitus and lack of mobility -BP excellent today,  Currently on Furosemide 80 mg once daily, Toprol XL  50 mg, Spironolactone 25 mg  -can't really make any med changes currently given the state of her pill box and taking meds inappropriately but pharmacy team has fixed her pill box and instructed to get help with filling it from her boyfriend who has better vision.  -will repeat a bmp today  -would benefit from OSA evaluation she is high risk  Multivessel CAD s/p recent NSTEMI with subsequent LAD stent: -only taking brillinta, given asa today and medication education given about the need for DAPT  Morbid Obesity: -encouraged weight loss  T2DM: -would avoid SGLT2i due to issues with hygiene due to body habitus -would benefit from GLP-1RA with weight loss benefits  like ozempic  Myalgias, arthralgias: -encouraged to follow up with PCP and consider repeat covid testing although was normal 9 days ago   Follow up with general cardiology

## 2020-04-06 NOTE — Progress Notes (Addendum)
[facility]  Heart Failure Pharmacist Encounter  HPI:   50 YO female with PMH of T2DM, HTN, HLD, OSA, and obesity. She presented to the ED on 03/28/20 for worsening SOB, orthopnea and chest pain. Cardiac workup revealed NSTEMI. Developed recurrent chest pain and was urgently taken to cath lab on 03/28/20 and underwent stent placement to the mid LAD. Patient reports symptom relief post procedure. ECHO completed on 03/28/20 with LVEF of 50-55%. Patient was discharged on 04/03/2020.   Today, [name] presents to the [facility] for follow up. Since discharge, patient endorses dizziness with headache/pressure in head. This is consistent throughout the day, with no specific triggers. Endorses lower leg edema. Denies shortness of breath, dyspnea or orthopnea - has improved greatly since hospitalization. Does not have a scale at home; one has been provided at visit today. Does not have a BP cuff at home. Diet has improved, but is very difficult for her. She often struggles to know what she can eat that will be carb and sodium appropriate.   She presented with her pill box and medication bottles today. Pill box was filled incorrectly, with some days having duplicate pills vs some missing pills. She requested help with pill box organization, stating it is hard for her to see. I completed correct filling of her pill box and educated on each medication.   HF Medications: Furosemide 80 mg once daily Metoprolol succinate 50 mg once daily Spironolactone 25 mg once daily   Has the patient been experiencing any side effects to the medications prescribed?  no  Does the patient have any problems obtaining medications due to transportation or finances?   Yes - copays have been higher than desired, mostly related to insulin. Price of copays largely due to choice of insurance, which patient acknowledged.   Understanding of regimen: good Understanding of indications: good Potential of  compliance: good Patient understands to avoid NSAIDs. Patient understands to avoid decongestants.   Pertinent Lab Values:  Serum creatinine 1.40, BUN 31, Potassium 4.0, Sodium 138, Magnesium 2.7  Vital Signs:  Weight: 164.8 kg (discharge weight: 165 kg)  Blood pressure: 124/64   Heart rate: 77   Medication Assistance / Insurance Benefits Check: Does the patient have prescription insurance?  Yes Type of insurance plan: [employer] - Commercial Plan   Outpatient Pharmacy:  Current outpatient pharmacy: [facility] on [address]  Was the [facility] pharmacy used to supply discharge medications? yes  If TOC pharmacy was used, were the refills transferred out to current pharmacy yet? no - sent message to [facility] requesting transfers be completed  Is the patient willing to transition their outpatient pharmacy to utilize a [facility] outpatient pharmacy with or without mail order?   No  Assessment: 1) Chronic systolic CHF (EF 74-94%). NYHA class II symptoms, but difficult to assess due to limited mobility.  - Continue furosemide 80 mg daily - Previously on losartan PTA; held inpatient due to soft BP, will continue to hold as it is unclear as to what medications she has been taking. As specific regimen is set, an consider starting losartan back at [facility] follow up - Continue metoprolol succinate 50 mg once daily - Continue spironolactone 25 mg once daily - Avoid SGLT-2 due to hygiene limitations   Plan: 1) Medication changes: - Continue current medication regimen   2) Patient Assistance: - No assistance to note at this time   3) Follow up: - Next appointment with [facility] on 04/21/2020  Theodis Sato,  PharmD PGY-1 Community Pharmacy Resident  04/06/2020 4:18 PM  Sharen Hones, PharmD, BCPS Heart Failure [facility] Pharmacist [phone]

## 2020-04-07 ENCOUNTER — Telehealth (HOSPITAL_COMMUNITY): Payer: Self-pay

## 2020-04-07 ENCOUNTER — Encounter (HOSPITAL_COMMUNITY): Payer: Self-pay

## 2020-04-07 ENCOUNTER — Encounter (HOSPITAL_COMMUNITY): Payer: 59

## 2020-04-07 NOTE — Telephone Encounter (Signed)
Attempted to call patient in regards to Cardiac Rehab - LM on VM 

## 2020-04-13 ENCOUNTER — Telehealth: Payer: Self-pay | Admitting: Cardiology

## 2020-04-13 NOTE — Telephone Encounter (Signed)
Patient states herPt c/o swelling: STAT is pt has developed SOB within 24 hours  1) How much weight have you gained and in what time span? Almost 9 lbs in 1 day   2) If swelling, where is the swelling located? Both legs, but mainly her left leg.  Are you currently taking a fluid pill? Yes, patient takes 1 Furosemide 80 MG daily  3) Are you currently SOB? No   4) Do you have a log of your daily weights (if so, list)?  03/10: 363.4 03/11: 362.2 03/12: 362.4 03/13: 364.6 03/14: 363.4 03/15: 365.4 03/16: 363.2 03/17: 371.8  (taken 3x)  371.4 (after 3 hours)   5) Have you gained 3 pounds in a day or 5 pounds in a week? Patient states she gained 9 lbs over night  6) Have you traveled recently? No

## 2020-04-13 NOTE — Telephone Encounter (Signed)
Spoke with pt and yesterday noted some swelling Per  Pt was active yesterday and only ate 2 meals of which 1 meal there was no salt and the other meal was no added salt pt is watching salt intake .Also pt noted swelling in both legs  with the left leg being  bigger Per pt swelling has not changed since getting out of bed this am and did not have any other symptoms was able to lay flat last night in bed and slept on side without difficulty in breathing Pt is taking furosemide 80 mg and Aldactone 25 mg daily Will forward to Dr Jens Som for review .Zack Seal

## 2020-04-13 NOTE — Telephone Encounter (Signed)
Take lasix 80 mg BID for 3 days and then resume 80 mg daily; bmet one week [name]

## 2020-04-13 NOTE — Telephone Encounter (Signed)
Advised patient, verbalized understanding  

## 2020-04-14 ENCOUNTER — Other Ambulatory Visit: Payer: Self-pay

## 2020-04-14 ENCOUNTER — Emergency Department (HOSPITAL_COMMUNITY): Payer: 59

## 2020-04-14 ENCOUNTER — Emergency Department (HOSPITAL_COMMUNITY)
Admission: EM | Admit: 2020-04-14 | Discharge: 2020-04-14 | Disposition: A | Discharge status: Home / Self Care | Payer: 59 | Attending: Emergency Medicine | Admitting: Emergency Medicine

## 2020-04-14 ENCOUNTER — Encounter: Payer: Self-pay | Admitting: General Practice

## 2020-04-14 DIAGNOSIS — Z7982 Long term (current) use of aspirin: Secondary | ICD-10-CM | POA: Insufficient documentation

## 2020-04-14 DIAGNOSIS — R0789 Other chest pain: Secondary | ICD-10-CM | POA: Diagnosis not present

## 2020-04-14 DIAGNOSIS — R2243 Localized swelling, mass and lump, lower limb, bilateral: Secondary | ICD-10-CM | POA: Insufficient documentation

## 2020-04-14 DIAGNOSIS — R0602 Shortness of breath: Secondary | ICD-10-CM | POA: Insufficient documentation

## 2020-04-14 DIAGNOSIS — I2510 Atherosclerotic heart disease of native coronary artery without angina pectoris: Secondary | ICD-10-CM | POA: Diagnosis not present

## 2020-04-14 DIAGNOSIS — Z7984 Long term (current) use of oral hypoglycemic drugs: Secondary | ICD-10-CM | POA: Insufficient documentation

## 2020-04-14 DIAGNOSIS — Z794 Long term (current) use of insulin: Secondary | ICD-10-CM | POA: Diagnosis not present

## 2020-04-14 DIAGNOSIS — I10 Essential (primary) hypertension: Secondary | ICD-10-CM | POA: Diagnosis not present

## 2020-04-14 DIAGNOSIS — Z7902 Long term (current) use of antithrombotics/antiplatelets: Secondary | ICD-10-CM | POA: Diagnosis not present

## 2020-04-14 DIAGNOSIS — Z91040 Latex allergy status: Secondary | ICD-10-CM | POA: Diagnosis not present

## 2020-04-14 DIAGNOSIS — E039 Hypothyroidism, unspecified: Secondary | ICD-10-CM | POA: Insufficient documentation

## 2020-04-14 DIAGNOSIS — Z79899 Other long term (current) drug therapy: Secondary | ICD-10-CM | POA: Diagnosis not present

## 2020-04-14 DIAGNOSIS — E1169 Type 2 diabetes mellitus with other specified complication: Secondary | ICD-10-CM | POA: Insufficient documentation

## 2020-04-14 LAB — CBC
HCT: 40.7 % (ref 36.0–46.0)
Hemoglobin: 12.6 g/dL (ref 12.0–15.0)
MCH: 24.8 pg — ABNORMAL LOW (ref 26.0–34.0)
MCHC: 31 g/dL (ref 30.0–36.0)
MCV: 80 fL (ref 80.0–100.0)
Platelets: 242 10*3/uL (ref 150–400)
RBC: 5.09 MIL/uL (ref 3.87–5.11)
RDW: 18 % — ABNORMAL HIGH (ref 11.5–15.5)
WBC: 10.5 10*3/uL (ref 4.0–10.5)
nRBC: 0 % (ref 0.0–0.2)

## 2020-04-14 LAB — I-STAT BETA HCG BLOOD, ED (MC, WL, AP ONLY): I-stat hCG, quantitative: <5 m[IU]/mL (ref <5)

## 2020-04-14 LAB — BASIC METABOLIC PANEL
Anion gap: 7 (ref 5–15)
BUN: 21 mg/dL — ABNORMAL HIGH (ref 6–20)
CO2: 29 mmol/L (ref 22–32)
Calcium: 9.4 mg/dL (ref 8.9–10.3)
Chloride: 103 mmol/L (ref 98–111)
Creatinine, Ser: 1.27 mg/dL — ABNORMAL HIGH (ref 0.44–1.00)
GFR, Estimated: 52 mL/min — ABNORMAL LOW (ref >60)
Glucose, Bld: 147 mg/dL — ABNORMAL HIGH (ref 70–99)
Potassium: 4.5 mmol/L (ref 3.5–5.1)
Sodium: 139 mmol/L (ref 135–145)

## 2020-04-14 LAB — TROPONIN I (HIGH SENSITIVITY)
Troponin I (High Sensitivity): 9 ng/L (ref <18)
Troponin I (High Sensitivity): 10 ng/L (ref <18)

## 2020-04-14 LAB — CBG MONITORING, ED: Glucose-Capillary: 67 mg/dL — ABNORMAL LOW (ref 70–99)

## 2020-04-14 NOTE — ED Triage Notes (Signed)
Shortness of breath and heaviness on chest, bilateral lower extremities swollen. Pt reports 7pound weight gain compared to yesterday. Hx of CHF. Chronic oxygen use 2lpm via [place].

## 2020-04-14 NOTE — ED Notes (Signed)
she walked with her walker around the room sp02 arranged from 95-100%. Pt never c/o being sob

## 2020-04-15 NOTE — ED Provider Notes (Signed)
[facility] EMERGENCY DEPARTMENT Provider Note   CSN: [id number] Arrival date & time: 04/14/20  0009     History Chief Complaint  Patient presents with  . Shortness of Breath  . Chest Pain    [name] is a 50 y.o. female.  Recent NSTEMI and onset heart failure.  Been on some diuretics but over the last 24 hours has 7 pound weight gain.  She also had worsening chest heaviness and shortness of breath.  She talked to her doctor who told her to double her torsemide for the next 3 days.  She took an extra dose prior to coming here but because of chest pain went to get evaluated to make sure she did not have recurrent ischemia.  At this time she states that she is urinated "many many times at least 4 L" since she has been here.  Some of the decreased swelling in her leg since being here as well.   Shortness of Breath Associated symptoms: chest pain   Chest Pain Associated symptoms: shortness of breath        Past Medical History:  Diagnosis Date  . DDD (degenerative disc disease), lumbar   . Diabetes mellitus without complication (HCC)   . Hypertension   . Lumbar herniated disc     Patient Active Problem List   Diagnosis Date Noted  . Acute respiratory failure with hypoxia (HCC) 04/02/2020  . CAD (coronary artery disease) 03/31/2020  . Acute on chronic congestive heart failure (HCC) 03/28/2020  . Essential hypertension 03/28/2020  . GERD without esophagitis 03/28/2020  . Uncontrolled type 2 diabetes mellitus with hyperglycemia, with long-term current use of insulin (HCC) 03/28/2020  . Mixed hyperlipidemia due to type 2 diabetes mellitus (HCC) 03/28/2020  . Hypothyroidism 03/28/2020  . Major depressive disorder 03/28/2020  . Perirectal abscess 06/21/2014    Past Surgical History:  Procedure Laterality Date  . CORONARY STENT INTERVENTION N/A 03/28/2020   Procedure: CORONARY STENT INTERVENTION;  Surgeon: Corky Crafts, MD;  Location: [facility]  INVASIVE CV LAB;  Service: Cardiovascular;  Laterality: N/A;  . DILATION AND CURETTAGE OF UTERUS    . INCISION AND DRAINAGE PERIRECTAL ABSCESS  06/21/2014  . INCISION AND DRAINAGE PERIRECTAL ABSCESS Bilateral 06/21/2014   Procedure: IRRIGATION AND DEBRIDEMENT PERIRECTAL ABSCESS;  Surgeon: Abigail Miyamoto, MD;  Location: [facility] OR;  Service: General;  Laterality: Bilateral;  . LEFT HEART CATH AND CORONARY ANGIOGRAPHY N/A 03/28/2020   Procedure: LEFT HEART CATH AND CORONARY ANGIOGRAPHY;  Surgeon: Corky Crafts, MD;  Location: [facility] INVASIVE CV LAB;  Service: Cardiovascular;  Laterality: N/A;     OB History   No obstetric history on file.     Family History  Problem Relation Age of Onset  . Heart disease Neg Hx     Social History   Tobacco Use  . Smoking status: Never Smoker  . Smokeless tobacco: Never Used  Vaping Use  . Vaping Use: Never used  Substance Use Topics  . Alcohol use: No  . Drug use: No    Home Medications Prior to Admission medications   Medication Sig Start Date End Date Taking? Authorizing Provider  acetaminophen (TYLENOL) 500 MG tablet Take 1,000 mg by mouth every 6 (six) hours as needed for moderate pain or headache.   Yes [provider]  aspirin 81 MG chewable tablet Chew 1 tablet (81 mg total) by mouth daily. 04/04/20  Yes Lewie Chamber, MD  clopidogrel (PLAVIX) 75 MG tablet Take 75 mg by mouth  daily. 05/06/20  Yes [provider]  furosemide (LASIX) 80 MG tablet Take 1 tablet (80 mg total) by mouth daily. 04/05/20  Yes Lewie Chamber, MD  gabapentin (NEURONTIN) 300 MG capsule Take 600 mg by mouth at bedtime. 04/25/14  Yes [provider]  levothyroxine (SYNTHROID, LEVOTHROID) 50 MCG tablet Take 50 mcg by mouth daily. 04/15/14  Yes [provider]  metFORMIN (GLUCOPHAGE) 1000 MG tablet Take 1,000 mg by mouth 2 (two) times daily. 02/17/20  Yes [provider]  metoprolol succinate (TOPROL XL) 50 MG 24 hr tablet Take 1 tablet  (50 mg total) by mouth daily. Take with or immediately following a meal. 04/03/20 04/03/21 Yes Girguis, Onalee Hua, MD  NOVOLIN 70/30 RELION (70-30) 100 UNIT/ML injection Inject 125-135 Units into the skin See admin instructions. Use 125 units every morning and use 135 units every evening 03/21/20  Yes [provider]  nystatin cream (MYCOSTATIN) Apply 1 application topically in the morning, at noon, in the evening, and at bedtime. Abdominal folds   Yes [provider]  omeprazole (PRILOSEC) 40 MG capsule Take 40 mg by mouth 2 (two) times daily.   Yes [provider]  potassium chloride (KLOR-CON) 10 MEQ tablet Take 1 tablet (10 mEq total) by mouth daily. 04/04/20  Yes Lewie Chamber, MD  rosuvastatin (CRESTOR) 20 MG tablet Take 1 tablet (20 mg total) by mouth 3 (three) times a week. Increase to daily as able to tolerate Patient taking differently: Take 20 mg by mouth every Monday, Wednesday, and Friday. Increase to daily as able to tolerate 04/05/20  Yes Lewie Chamber, MD  spironolactone (ALDACTONE) 25 MG tablet Take 1 tablet (25 mg total) by mouth daily. 04/04/20  Yes Lewie Chamber, MD  ticagrelor (BRILINTA) 90 MG TABS tablet Take 1 tablet (90 mg total) by mouth 2 (two) times daily. 04/03/20  Yes Lewie Chamber, MD  nystatin (MYCOSTATIN/NYSTOP) powder Apply 1 application topically in the morning, at noon, in the evening, and at bedtime. Patient not taking: Reported on 04/14/2020    [provider]    Allergies    Clindamycin/lincomycin, Latex, and Ultram [tramadol]  Review of Systems   Review of Systems  Respiratory: Positive for shortness of breath.   Cardiovascular: Positive for chest pain.  All other systems reviewed and are negative.   Physical Exam Updated Vital Signs BP 139/75 (BP Location: Right Arm)   Pulse 83   Temp 98.3 F (36.8 C) (Oral)   Resp 18   Ht 5\' 6"  (1.676 m)   Wt (!) 168.6 kg   SpO2 98%   BMI 60.01 kg/m   Physical Exam Vitals and nursing  note reviewed.  Constitutional:      Appearance: She is well-developed.  HENT:     Head: Normocephalic and atraumatic.  Cardiovascular:     Rate and Rhythm: Normal rate and regular rhythm.  Pulmonary:     Effort: No respiratory distress.     Breath sounds: No stridor. Decreased breath sounds present.  Abdominal:     General: There is no distension.  Musculoskeletal:     Cervical back: Normal range of motion.     Right lower leg: Edema present.     Left lower leg: Edema present.  Skin:    General: Skin is warm and dry.  Neurological:     General: No focal deficit present.     Mental Status: She is alert.     ED Results / Procedures / Treatments   Labs (all  labs ordered are listed, but only abnormal results are displayed) Labs Reviewed  BASIC METABOLIC PANEL - Abnormal; Notable for the following components:      Result Value   Glucose, Bld 147 (*)    BUN 21 (*)    Creatinine, Ser 1.27 (*)    GFR, Estimated 52 (*)    All other components within normal limits  CBC - Abnormal; Notable for the following components:   MCH 24.8 (*)    RDW 18.0 (*)    All other components within normal limits  CBG MONITORING, ED - Abnormal; Notable for the following components:   Glucose-Capillary 67 (*)    All other components within normal limits  I-STAT BETA HCG BLOOD, ED ([facility], [facility], [facility] ONLY)  TROPONIN I (HIGH SENSITIVITY)  TROPONIN I (HIGH SENSITIVITY)    EKG EKG Interpretation  Date/Time:  Friday April 14 2020 00:14:37 EDT Ventricular Rate:  78 PR Interval:  154 QRS Duration: 92 QT Interval:  372 QTC Calculation: 424 R Axis:   -79 Text Interpretation: Normal sinus rhythm Left anterior fascicular block Possible Anterolateral infarct , age undetermined Abnormal ECG Confirmed by Marily Memos [phone]) on 04/14/2020 5:25:31 AM   Radiology DG Chest 2 View  Result Date: 04/14/2020 CLINICAL DATA:  Short of breath, chest heaviness, weight gain EXAM: CHEST - 2 VIEW COMPARISON:  03/28/2020  FINDINGS: Frontal and lateral views of the chest demonstrate an unremarkable cardiac silhouette. No airspace disease, effusion, or pneumothorax. IMPRESSION: 1. No acute intrathoracic process. Electronically Signed   By: Sharlet Salina M.D.   On: 04/14/2020 00:47    Procedures Procedures   Medications Ordered in ED Medications - No data to display  ED Course  I have reviewed the triage vital signs and the nursing notes.  Pertinent labs & imaging results that were available during my care of the patient were reviewed by me and considered in my medical decision making (see chart for details).    MDM Rules/Calculators/A&P                          Likely chf exacerbation but has already initiated appropriate treatments at home and is improving. No hypoxia. Ambulates with baseline respiratory status. No indication for admission at this time. Stable for dc on continued increased diuretics and outpatient follow up.  Final Clinical Impression(s) / ED Diagnoses Final diagnoses:  Shortness of breath    Rx / DC Orders ED Discharge Orders    None       Mesner, Barbara Cower, MD 04/15/20 [phone]

## 2020-04-21 ENCOUNTER — Other Ambulatory Visit: Payer: Self-pay

## 2020-04-21 ENCOUNTER — Emergency Department (HOSPITAL_COMMUNITY)
Admission: EM | Admit: 2020-04-21 | Discharge: 2020-04-21 | Disposition: A | Discharge status: Home / Self Care | Payer: 59 | Attending: Emergency Medicine | Admitting: Emergency Medicine

## 2020-04-21 ENCOUNTER — Encounter (HOSPITAL_COMMUNITY): Payer: Self-pay | Admitting: Emergency Medicine

## 2020-04-21 ENCOUNTER — Emergency Department (HOSPITAL_COMMUNITY): Payer: 59

## 2020-04-21 ENCOUNTER — Ambulatory Visit: Payer: 59 | Admitting: Adult Health

## 2020-04-21 DIAGNOSIS — Z9861 Coronary angioplasty status: Secondary | ICD-10-CM | POA: Diagnosis not present

## 2020-04-21 DIAGNOSIS — Z794 Long term (current) use of insulin: Secondary | ICD-10-CM | POA: Diagnosis not present

## 2020-04-21 DIAGNOSIS — I110 Hypertensive heart disease with heart failure: Secondary | ICD-10-CM | POA: Insufficient documentation

## 2020-04-21 DIAGNOSIS — K8020 Calculus of gallbladder without cholecystitis without obstruction: Secondary | ICD-10-CM

## 2020-04-21 DIAGNOSIS — R42 Dizziness and giddiness: Secondary | ICD-10-CM | POA: Insufficient documentation

## 2020-04-21 DIAGNOSIS — E119 Type 2 diabetes mellitus without complications: Secondary | ICD-10-CM | POA: Insufficient documentation

## 2020-04-21 DIAGNOSIS — Z7982 Long term (current) use of aspirin: Secondary | ICD-10-CM | POA: Diagnosis not present

## 2020-04-21 DIAGNOSIS — Z7984 Long term (current) use of oral hypoglycemic drugs: Secondary | ICD-10-CM | POA: Insufficient documentation

## 2020-04-21 DIAGNOSIS — R944 Abnormal results of kidney function studies: Secondary | ICD-10-CM | POA: Diagnosis not present

## 2020-04-21 DIAGNOSIS — R1013 Epigastric pain: Secondary | ICD-10-CM | POA: Diagnosis present

## 2020-04-21 DIAGNOSIS — I5023 Acute on chronic systolic (congestive) heart failure: Secondary | ICD-10-CM | POA: Insufficient documentation

## 2020-04-21 DIAGNOSIS — Z91040 Latex allergy status: Secondary | ICD-10-CM | POA: Insufficient documentation

## 2020-04-21 DIAGNOSIS — E039 Hypothyroidism, unspecified: Secondary | ICD-10-CM | POA: Diagnosis not present

## 2020-04-21 DIAGNOSIS — I2510 Atherosclerotic heart disease of native coronary artery without angina pectoris: Secondary | ICD-10-CM | POA: Insufficient documentation

## 2020-04-21 LAB — CBC
HCT: 42 % (ref 36.0–46.0)
Hemoglobin: 12.9 g/dL (ref 12.0–15.0)
MCH: 24.3 pg — ABNORMAL LOW (ref 26.0–34.0)
MCHC: 30.7 g/dL (ref 30.0–36.0)
MCV: 79.2 fL — ABNORMAL LOW (ref 80.0–100.0)
Platelets: 245 10*3/uL (ref 150–400)
RBC: 5.3 MIL/uL — ABNORMAL HIGH (ref 3.87–5.11)
RDW: 17.5 % — ABNORMAL HIGH (ref 11.5–15.5)
WBC: 10.8 10*3/uL — ABNORMAL HIGH (ref 4.0–10.5)
nRBC: 0 % (ref 0.0–0.2)

## 2020-04-21 LAB — BASIC METABOLIC PANEL
Anion gap: 13 (ref 5–15)
BUN: 37 mg/dL — ABNORMAL HIGH (ref 6–20)
CO2: 29 mmol/L (ref 22–32)
Calcium: 9.4 mg/dL (ref 8.9–10.3)
Chloride: 96 mmol/L — ABNORMAL LOW (ref 98–111)
Creatinine, Ser: 1.55 mg/dL — ABNORMAL HIGH (ref 0.44–1.00)
GFR, Estimated: 41 mL/min — ABNORMAL LOW (ref >60)
Glucose, Bld: 139 mg/dL — ABNORMAL HIGH (ref 70–99)
Potassium: 4.3 mmol/L (ref 3.5–5.1)
Sodium: 138 mmol/L (ref 135–145)

## 2020-04-21 LAB — LIPASE, BLOOD: Lipase: 43 U/L (ref 11–51)

## 2020-04-21 LAB — HEPATIC FUNCTION PANEL
ALT: 24 U/L (ref 0–44)
AST: 22 U/L (ref 15–41)
Albumin: 3.6 g/dL (ref 3.5–5.0)
Alkaline Phosphatase: 87 U/L (ref 38–126)
Bilirubin, Direct: 0.2 mg/dL (ref 0.0–0.2)
Indirect Bilirubin: 0.6 mg/dL (ref 0.3–0.9)
Total Bilirubin: 0.8 mg/dL (ref 0.3–1.2)
Total Protein: 7.6 g/dL (ref 6.5–8.1)

## 2020-04-21 MED ORDER — MECLIZINE HCL 25 MG PO TABS
25.0000 mg | ORAL_TABLET | Freq: Three times a day (TID) | ORAL | 0 refills | Status: DC | PRN
Start: 2020-04-21 — End: 2021-04-09

## 2020-04-21 MED ORDER — ONDANSETRON HCL 4 MG/2ML IJ SOLN
4.0000 mg | Freq: Once | INTRAMUSCULAR | Status: AC
  Administered 2020-04-21: 4 mg via INTRAVENOUS
  Filled 2020-04-21: qty 2

## 2020-04-21 MED ORDER — MECLIZINE HCL 25 MG PO TABS
25.0000 mg | ORAL_TABLET | Freq: Once | ORAL | Status: AC
  Administered 2020-04-21: 25 mg via ORAL
  Filled 2020-04-21: qty 1

## 2020-04-21 MED ORDER — MORPHINE SULFATE (PF) 4 MG/ML IV SOLN
4.0000 mg | Freq: Once | INTRAVENOUS | Status: AC
  Administered 2020-04-21: 4 mg via INTRAVENOUS
  Filled 2020-04-21: qty 1

## 2020-04-21 MED ORDER — ONDANSETRON 8 MG PO TBDP: 8.0000 mg | ORAL_TABLET | Freq: Three times a day (TID) | ORAL | 0 refills | Status: DC | PRN

## 2020-04-21 NOTE — Discharge Instructions (Signed)
Take the meclizine to help with the dizziness.  Try doing the Epley maneuvers described in your discharge instructions.  Follow-up with your primary care doctor to be rechecked and consider seeing the general surgeon to discuss treatment options of your gallstones.

## 2020-04-21 NOTE — ED Notes (Signed)
E-signature pad unavailable at time of pt discharge. This RN discussed discharge materials with pt and answered all pt questions. Pt stated understanding of discharge material. ? ?

## 2020-04-21 NOTE — ED Triage Notes (Signed)
Patient complains of dizziness x2 weeks, right flank pain starting today, emesis that started today, and abdominal cramping that started intermittently since starting a cholesterol medication. Patient alert, oriented, and in on apparent distress at this time. Wears 2L [place] at baseline.

## 2020-04-21 NOTE — ED Provider Notes (Signed)
[facility] EMERGENCY DEPARTMENT Provider Note   CSN: [id number] Arrival date & time: 04/21/20  1532     History Chief Complaint  Patient presents with  . Dizziness    [name] is a 50 y.o. female.  HPI   Patient presents to the ED with complaints of intermittent dizziness that started about a weeks ago.  Patient was recently admitted to the hospital on March 1 for non-ST elevation MI.  She ended up undergoing cardiac stenting.  Patient states she has been having episodes of intermittent dizziness since that period of time.  She has mentioned it to the doctors but she has been having other issues with CHF exacerbations and her recovery from non-ST elevation MI at the same time.  Patient states the dizziness does seem to cause nausea.  She does feel like the room is spinning.  Does get worse with certain positions.  Today the symptoms have been worse and she is having more severe dizziness.  She feels like she might even pass out.  Patient also has been having diffuse body cramping that comes and goes and is throughout her arms and legs.  She is also having episodes of abdominal cramping mostly in the upper abdomen on the right side.  Does radiate somewhat to the back.  She had episodes of vomiting but no diarrhea.  No fevers.  No dysuria.  Past Medical History:  Diagnosis Date  . DDD (degenerative disc disease), lumbar   . Diabetes mellitus without complication (HCC)   . Hypertension   . Lumbar herniated disc     Patient Active Problem List   Diagnosis Date Noted  . Acute respiratory failure with hypoxia (HCC) 04/02/2020  . CAD (coronary artery disease) 03/31/2020  . Acute on chronic congestive heart failure (HCC) 03/28/2020  . Essential hypertension 03/28/2020  . GERD without esophagitis 03/28/2020  . Uncontrolled type 2 diabetes mellitus with hyperglycemia, with long-term current use of insulin (HCC) 03/28/2020  . Mixed hyperlipidemia due to type 2  diabetes mellitus (HCC) 03/28/2020  . Hypothyroidism 03/28/2020  . Major depressive disorder 03/28/2020  . Perirectal abscess 06/21/2014    Past Surgical History:  Procedure Laterality Date  . CORONARY STENT INTERVENTION N/A 03/28/2020   Procedure: CORONARY STENT INTERVENTION;  Surgeon: Corky Crafts, MD;  Location: [facility] INVASIVE CV LAB;  Service: Cardiovascular;  Laterality: N/A;  . DILATION AND CURETTAGE OF UTERUS    . INCISION AND DRAINAGE PERIRECTAL ABSCESS  06/21/2014  . INCISION AND DRAINAGE PERIRECTAL ABSCESS Bilateral 06/21/2014   Procedure: IRRIGATION AND DEBRIDEMENT PERIRECTAL ABSCESS;  Surgeon: Abigail Miyamoto, MD;  Location: [facility] OR;  Service: General;  Laterality: Bilateral;  . LEFT HEART CATH AND CORONARY ANGIOGRAPHY N/A 03/28/2020   Procedure: LEFT HEART CATH AND CORONARY ANGIOGRAPHY;  Surgeon: Corky Crafts, MD;  Location: [facility] INVASIVE CV LAB;  Service: Cardiovascular;  Laterality: N/A;     OB History   No obstetric history on file.     Family History  Problem Relation Age of Onset  . Heart disease Neg Hx     Social History   Tobacco Use  . Smoking status: Never Smoker  . Smokeless tobacco: Never Used  Vaping Use  . Vaping Use: Never used  Substance Use Topics  . Alcohol use: No  . Drug use: No    Home Medications Prior to Admission medications   Medication Sig Start Date End Date Taking? Authorizing Provider  meclizine (ANTIVERT) 25 MG tablet Take 1-2 tablets (25-50  mg total) by mouth 3 (three) times daily as needed for dizziness. 04/21/20  Yes Linwood Dibbles, MD  ondansetron (ZOFRAN ODT) 8 MG disintegrating tablet Take 1 tablet (8 mg total) by mouth every 8 (eight) hours as needed for nausea or vomiting. 04/21/20  Yes Linwood Dibbles, MD  acetaminophen (TYLENOL) 500 MG tablet Take 1,000 mg by mouth every 6 (six) hours as needed for moderate pain or headache.    [provider]  aspirin 81 MG chewable tablet Chew 1 tablet (81 mg total) by mouth daily.  04/04/20   Lewie Chamber, MD  clopidogrel (PLAVIX) 75 MG tablet Take 75 mg by mouth daily. 05/06/20   [provider]  furosemide (LASIX) 80 MG tablet Take 1 tablet (80 mg total) by mouth daily. 04/05/20   Lewie Chamber, MD  gabapentin (NEURONTIN) 300 MG capsule Take 600 mg by mouth at bedtime. 04/25/14   [provider]  levothyroxine (SYNTHROID, LEVOTHROID) 50 MCG tablet Take 50 mcg by mouth daily. 04/15/14   [provider]  metFORMIN (GLUCOPHAGE) 1000 MG tablet Take 1,000 mg by mouth 2 (two) times daily. 02/17/20   [provider]  metoprolol succinate (TOPROL XL) 50 MG 24 hr tablet Take 1 tablet (50 mg total) by mouth daily. Take with or immediately following a meal. 04/03/20 04/03/21  Lewie Chamber, MD  NOVOLIN 70/30 RELION (70-30) 100 UNIT/ML injection Inject 125-135 Units into the skin See admin instructions. Use 125 units every morning and use 135 units every evening 03/21/20   [provider]  nystatin (MYCOSTATIN/NYSTOP) powder Apply 1 application topically in the morning, at noon, in the evening, and at bedtime. Patient not taking: Reported on 04/14/2020    [provider]  nystatin cream (MYCOSTATIN) Apply 1 application topically in the morning, at noon, in the evening, and at bedtime. Abdominal folds    [provider]  omeprazole (PRILOSEC) 40 MG capsule Take 40 mg by mouth 2 (two) times daily.    [provider]  potassium chloride (KLOR-CON) 10 MEQ tablet Take 1 tablet (10 mEq total) by mouth daily. 04/04/20   Lewie Chamber, MD  rosuvastatin (CRESTOR) 20 MG tablet Take 1 tablet (20 mg total) by mouth 3 (three) times a week. Increase to daily as able to tolerate Patient taking differently: Take 20 mg by mouth every Monday, Wednesday, and Friday. Increase to daily as able to tolerate 04/05/20   Lewie Chamber, MD  spironolactone (ALDACTONE) 25 MG tablet Take 1 tablet (25 mg total) by mouth daily. 04/04/20   Lewie Chamber, MD   ticagrelor (BRILINTA) 90 MG TABS tablet Take 1 tablet (90 mg total) by mouth 2 (two) times daily. 04/03/20   Lewie Chamber, MD    Allergies    Clindamycin/lincomycin, Latex, and Ultram [tramadol]  Review of Systems   Review of Systems  Musculoskeletal: Positive for back pain ( Chronic, not a new issue for her).  All other systems reviewed and are negative.   Physical Exam Updated Vital Signs BP 128/88   Pulse 77   Temp 98.6 F (37 C)   Resp 20   SpO2 98%   Physical Exam Vitals and nursing note reviewed.  Constitutional:      Appearance: She is well-developed. She is morbidly obese. She is not diaphoretic.  HENT:     Head: Normocephalic and atraumatic.     Right Ear: External ear normal.     Left Ear: External ear normal.  Eyes:     General: No scleral  icterus.       Right eye: No discharge.        Left eye: No discharge.     Conjunctiva/sclera: Conjunctivae normal.  Neck:     Trachea: No tracheal deviation.  Cardiovascular:     Rate and Rhythm: Normal rate and regular rhythm.  Pulmonary:     Effort: Pulmonary effort is normal. No respiratory distress.     Breath sounds: Normal breath sounds. No stridor. No wheezing or rales.  Abdominal:     General: Bowel sounds are normal. There is no distension.     Palpations: Abdomen is soft.     Tenderness: There is abdominal tenderness in the epigastric area. There is no guarding or rebound.  Musculoskeletal:        General: No tenderness.     Cervical back: Neck supple.  Skin:    General: Skin is warm and dry.     Findings: No rash.  Neurological:     Mental Status: She is alert.     Cranial Nerves: No cranial nerve deficit (no facial droop, extraocular movements intact, no slurred speech).     Sensory: No sensory deficit.     Motor: No abnormal muscle tone or seizure activity.     Coordination: Coordination normal.     Comments: Normal strength and sensation bilateral upper and lower extremities     ED Results /  Procedures / Treatments   Labs (all labs ordered are listed, but only abnormal results are displayed) Labs Reviewed  BASIC METABOLIC PANEL - Abnormal; Notable for the following components:      Result Value   Chloride 96 (*)    Glucose, Bld 139 (*)    BUN 37 (*)    Creatinine, Ser 1.55 (*)    GFR, Estimated 41 (*)    All other components within normal limits  CBC - Abnormal; Notable for the following components:   WBC 10.8 (*)    RBC 5.30 (*)    MCV 79.2 (*)    MCH 24.3 (*)    RDW 17.5 (*)    All other components within normal limits  HEPATIC FUNCTION PANEL  LIPASE, BLOOD  CBG MONITORING, ED    EKG None  Radiology CT Head Wo Contrast  Result Date: 04/21/2020 CLINICAL DATA:  Neuro deficit, acute, stroke suspected Dizziness and lightheadedness for 2 weeks. EXAM: CT HEAD WITHOUT CONTRAST TECHNIQUE: Contiguous axial images were obtained from the base of the skull through the vertex without intravenous contrast. COMPARISON:  None. FINDINGS: Brain: No intracranial hemorrhage, mass effect, or midline shift. No hydrocephalus. The basilar cisterns are patent. No evidence of territorial infarct or acute ischemia. No extra-axial or intracranial fluid collection. Vascular: No hyperdense vessel or unexpected calcification. Skull: Normal. Negative for fracture or focal lesion. Sinuses/Orbits: Paranasal sinuses and mastoid air cells are clear. The visualized orbits are unremarkable. Other: Small sebaceous cyst involving the vertex of the scalp. IMPRESSION: 1.  No acute intracranial abnormality. 2. Small scalp sebaceous cyst at the vertex, incidental. Electronically Signed   By: Narda Rutherford M.D.   On: 04/21/2020 18:36   US Abdomen Complete  Result Date: 04/21/2020 CLINICAL DATA:  Upper abdominal pain for 1 day, hypertension EXAM: ABDOMEN ULTRASOUND COMPLETE COMPARISON:  None. FINDINGS: Gallbladder: Echogenic material dependent within the gallbladder may reflect tumefactive sludge or  nonshadowing gallstones. No gallbladder wall thickening or pericholecystic fluid. Negative sonographic Murphy sign. Common bile duct: Diameter: 4 mm Liver: No focal lesion identified. Within normal limits in  parenchymal echogenicity. Portal vein is patent on color Doppler imaging with normal direction of blood flow towards the liver. IVC: Not well seen due to body habitus and bowel gas. Pancreas: Visualized portions are unremarkable. Limited evaluation of the pancreatic head due to bowel gas. Spleen: Size and appearance within normal limits. Right Kidney: Length: 10.9 cm. Echogenicity within normal limits. No mass or hydronephrosis visualized. Left Kidney: Length: 11.1 cm. Echogenicity within normal limits. No mass or hydronephrosis visualized. Abdominal aorta: Proximal aorta is unremarkable. Distal aorta and bifurcation not well visualized due to bowel gas and body habitus. Other findings: None. IMPRESSION: 1. Tumefactive sludge versus multiple small nonshadowing gallstones within the gallbladder lumen. No evidence of acute cholecystitis. 2. Otherwise unremarkable exam limited by body habitus and bowel gas. Electronically Signed   By: Sharlet SalinaMichael  Brown M.D.   On: 04/21/2020 18:16    Procedures Procedures   Medications Ordered in ED Medications  meclizine (ANTIVERT) tablet 25 mg (25 mg Oral Given 04/21/20 1705)  ondansetron (ZOFRAN) injection 4 mg (4 mg Intravenous Given 04/21/20 1705)  morphine 4 MG/ML injection 4 mg (4 mg Intravenous Given 04/21/20 1706)  ondansetron (ZOFRAN) injection 4 mg (4 mg Intravenous Given 04/21/20 1950)    ED Course  I have reviewed the triage vital signs and the nursing notes.  Pertinent labs & imaging results that were available during my care of the patient were reviewed by me and considered in my medical decision making (see chart for details).  Clinical Course as of 04/21/20 1956  Fri Apr 21, 2020  1648 Creatinine increased compared to previous [JK]  1648 CBC is normal  [JK]  1826 Right upper quadrant ultrasound does show possible gallstones but no findings to suggest cholecystitis. [JK]  1924 Head CT without acute finding.  Incidental small sebaceous cyst noted on the scalp. [JK]    Clinical Course User Index [JK] Linwood DibblesKnapp, Sheva Mcdougle, MD   MDM Rules/Calculators/A&P                          Patient presented to ED with complaints of dizziness as well as abdominal and body cramping.  Patient's dizziness sounds consistent with peripheral vertigo.  It is positional and she is having nausea.  She does not have any focal neurologic deficits on exam.  Patient's laboratory tests do not show any evidence of hepatitis or pancreatitis.  She was having tenderness in the upper abdomen so an ultrasound was performed and it does show some gallstones.  No findings to suggest cholecystitis.  Patient has had some persistent nausea she was treated with antiemetics and meclizine.  Will discharge home with those medications help with her systems.  Also recommend try doing Epley's maneuvers.  Discussed outpatient follow-up with general surgery.  Warning signs precautions discussed Final Clinical Impression(s) / ED Diagnoses Final diagnoses:  Vertigo  Gallstones    Rx / DC Orders ED Discharge Orders         Ordered    meclizine (ANTIVERT) 25 MG tablet  3 times daily PRN        04/21/20 1953    ondansetron (ZOFRAN ODT) 8 MG disintegrating tablet  Every 8 hours PRN        04/21/20 1953           Linwood DibblesKnapp, Tarique Loveall, MD 04/21/20 1958

## 2020-04-21 NOTE — ED Notes (Signed)
Patient transported to Ultrasound 

## 2020-05-04 ENCOUNTER — Telehealth: Payer: Self-pay | Admitting: Cardiology

## 2020-05-04 NOTE — Telephone Encounter (Signed)
   *  STAT* If patient is at the pharmacy, call can be transferred to refill team.   1. Which medications need to be refilled? (please list name of each medication and dose if known)   metoprolol succinate (TOPROL XL) 50 MG 24 hr tablet    potassium chloride (KLOR-CON) 10 MEQ tablet    spironolactone (ALDACTONE) 25 MG tablet   2. Which pharmacy/location (including street and city if local pharmacy) is medication to be sent to? [id number] - [place] ([place]), [place] - [address]  3. Do they need a 30 day or 90 day supply? 30 days

## 2020-05-05 ENCOUNTER — Other Ambulatory Visit: Payer: Self-pay

## 2020-05-05 MED ORDER — FUROSEMIDE 80 MG PO TABS: 80.0000 mg | ORAL_TABLET | Freq: Every day | ORAL | 5 refills | Status: DC

## 2020-05-05 MED ORDER — POTASSIUM CHLORIDE CRYS ER 10 MEQ PO TBCR: 10.0000 meq | EXTENDED_RELEASE_TABLET | Freq: Every day | ORAL | 5 refills | Status: DC

## 2020-05-05 MED ORDER — METOPROLOL SUCCINATE ER 50 MG PO TB24: 50.0000 mg | ORAL_TABLET | Freq: Every day | ORAL | 11 refills | Status: DC

## 2020-05-05 MED ORDER — SPIRONOLACTONE 25 MG PO TABS: 25.0000 mg | ORAL_TABLET | Freq: Every day | ORAL | 5 refills | Status: DC

## 2020-05-05 NOTE — Telephone Encounter (Signed)
*  STAT* If patient is at the pharmacy, call can be transferred to refill team.   1. Which medications need to be refilled? (please list name of each medication and dose if known)  Furosemide, Spironolactone, Potassium and Metoprolol- these  werer given when she was in the hospital  2. Which pharmacy/location (including street and city if local pharmacy) is medication to be sent to?  [facility] [address] [facility] [facility]   3. Do they need a 30 day or 90 day supply? 30 days

## 2020-05-26 ENCOUNTER — Other Ambulatory Visit: Payer: Self-pay

## 2020-05-26 MED ORDER — CLOPIDOGREL BISULFATE 75 MG PO TABS: 75.0000 mg | ORAL_TABLET | Freq: Every day | ORAL | 0 refills | Status: DC

## 2020-05-26 NOTE — Telephone Encounter (Signed)
This Dr. Ludwig Clarks pt, he is the primary Cardiologist. Please address

## 2020-06-04 NOTE — Progress Notes (Signed)
Cardiology Clinic Note   Patient Name: [name] Date of Encounter: 06/06/2020  Primary Care Provider:  Elizabeth Palau, FNP Primary Cardiologist:  [name], MD  Patient Profile    [name] 50 year old female presents to the clinic today for follow-up evaluation of her acute on chronic diastolic CHF, and CAD.  Past Medical History    Past Medical History:  Diagnosis Date  . DDD (degenerative disc disease), lumbar   . Diabetes mellitus without complication (HCC)   . Hypertension   . Lumbar herniated disc    Past Surgical History:  Procedure Laterality Date  . CORONARY STENT INTERVENTION N/A 03/28/2020   Procedure: CORONARY STENT INTERVENTION;  Surgeon: Corky Crafts, MD;  Location: [facility] INVASIVE CV LAB;  Service: Cardiovascular;  Laterality: N/A;  . DILATION AND CURETTAGE OF UTERUS    . INCISION AND DRAINAGE PERIRECTAL ABSCESS  06/21/2014  . INCISION AND DRAINAGE PERIRECTAL ABSCESS Bilateral 06/21/2014   Procedure: IRRIGATION AND DEBRIDEMENT PERIRECTAL ABSCESS;  Surgeon: Abigail Miyamoto, MD;  Location: [facility] OR;  Service: General;  Laterality: Bilateral;  . LEFT HEART CATH AND CORONARY ANGIOGRAPHY N/A 03/28/2020   Procedure: LEFT HEART CATH AND CORONARY ANGIOGRAPHY;  Surgeon: Corky Crafts, MD;  Location: [facility] INVASIVE CV LAB;  Service: Cardiovascular;  Laterality: N/A;    Allergies  Allergies  Allergen Reactions  . Clindamycin/Lincomycin Other (See Comments) and Nausea And Vomiting    Stomach pain   . Latex Rash  . Ultram [Tramadol] Rash    History of Present Illness    Ms. Sees has a PMH of hypertension, GERD, depression, hypothyroidism, morbid obesity, uncontrolled type 2 diabetes, and chronic diastolic CHF.  She was admitted to hospital with chest discomfort and on 03/28/2020.  Her high-sensitivity troponins were elevated at 514 and climbed to over 2000.  Her EKG showed nonspecific changes.  She was diagnosed with NSTEMI and taken to the  Cath Lab.  Cardiac catheterization showed 25% distal RCA, mid LAD 95%, mid LAD-to 50%, moderately elevated LVEDP at 27 mmHg.  She received PCI with DES x1 to her mid LAD.  Her echocardiogram at that time showed normal LVEF, septal/apical hypokinesis, normal diastolic function, no valvular abnormalities and normal right ventricular function.  She followed up with heart and vascular transition of care clinic 04/06/2020.  At that time she reported she felt achy since being discharged from the hospital.  She denied chest pain except with deep inhalation.  Her discomfort resolved with exhalation.  She reported overall feeling better.  Her medications were reviewed.  It was noted that her pillbox was not in order, containing days with missing doses of certain medications and multiple doses on other days.  She reported having very poor vision and having difficulty with her pill bottles.  She presents the clinic today for follow-up evaluation states she feels fairly well.  She does report that she has increased anxiety related to doctors visits and medical procedures.  She has noticed occasional brief episodes of sharp chest pain.  She reports that the pain can be reproduced with pressing over the area.  I educated her that this was related to muscle wall pain.  She expressed understanding.  She reports that she does have occasional brief episodes of palpitations.  She reports that these may happen a couple times a week.  She also reports that she read presented to the emergency department with complaints of lower extremity swelling.  She was instructed to take extra doses of her furosemide.  She  reports that she follows a low-sodium diet and restrict her fluid.  She is breathing much better now that she is on 2 L of oxygen via nasal cannula.  We will give her the [place] support stocking sheet, salty 6 diet sheet, have her increase her physical activity as tolerated, continue current medication regimen and order a BMP.   We will have her follow-up in 6 months with Dr. Jens Som.  Today she denies chest pain, shortness of breath, lower extremity edema, fatigue, palpitations, melena, hematuria, hemoptysis, diaphoresis, weakness, presyncope, syncope, orthopnea, and PND.   Home Medications    Prior to Admission medications   Medication Sig Start Date End Date Taking? Authorizing Provider  acetaminophen (TYLENOL) 500 MG tablet Take 1,000 mg by mouth every 6 (six) hours as needed for moderate pain or headache.    [provider]  aspirin 81 MG chewable tablet Chew 1 tablet (81 mg total) by mouth daily. 04/04/20   Lewie Chamber, MD  clopidogrel (PLAVIX) 75 MG tablet Take 1 tablet (75 mg total) by mouth daily. 05/26/20   Lewayne Bunting, MD  furosemide (LASIX) 80 MG tablet Take 1 tablet (80 mg total) by mouth daily. 05/05/20   Ronney Asters, NP  gabapentin (NEURONTIN) 300 MG capsule Take 600 mg by mouth at bedtime. 04/25/14   [provider]  levothyroxine (SYNTHROID, LEVOTHROID) 50 MCG tablet Take 50 mcg by mouth daily. 04/15/14   [provider]  meclizine (ANTIVERT) 25 MG tablet Take 1-2 tablets (25-50 mg total) by mouth 3 (three) times daily as needed for dizziness. 04/21/20   Linwood Dibbles, MD  metFORMIN (GLUCOPHAGE) 1000 MG tablet Take 1,000 mg by mouth 2 (two) times daily. 02/17/20   [provider]  metoprolol succinate (TOPROL XL) 50 MG 24 hr tablet Take 1 tablet (50 mg total) by mouth daily. Take with or immediately following a meal. 05/05/20 05/05/21  Leeanne Butters, Thomasene Ripple, NP  NOVOLIN 70/30 RELION (70-30) 100 UNIT/ML injection Inject 125-135 Units into the skin See admin instructions. Use 125 units every morning and use 135 units every evening 03/21/20   [provider]  nystatin (MYCOSTATIN/NYSTOP) powder Apply 1 application topically in the morning, at noon, in the evening, and at bedtime. Patient not taking: Reported on 04/14/2020    [provider]  nystatin cream  (MYCOSTATIN) Apply 1 application topically in the morning, at noon, in the evening, and at bedtime. Abdominal folds    [provider]  omeprazole (PRILOSEC) 40 MG capsule Take 40 mg by mouth 2 (two) times daily.    [provider]  ondansetron (ZOFRAN ODT) 8 MG disintegrating tablet Take 1 tablet (8 mg total) by mouth every 8 (eight) hours as needed for nausea or vomiting. 04/21/20   Linwood Dibbles, MD  potassium chloride (KLOR-CON) 10 MEQ tablet Take 1 tablet (10 mEq total) by mouth daily. 05/05/20   Ronney Asters, NP  rosuvastatin (CRESTOR) 20 MG tablet Take 1 tablet (20 mg total) by mouth 3 (three) times a week. Increase to daily as able to tolerate Patient taking differently: Take 20 mg by mouth every Monday, Wednesday, and Friday. Increase to daily as able to tolerate 04/05/20   Lewie Chamber, MD  spironolactone (ALDACTONE) 25 MG tablet Take 1 tablet (25 mg total) by mouth daily. 05/05/20   Ronney Asters, NP  ticagrelor (BRILINTA) 90 MG TABS tablet Take 1 tablet (90 mg total) by mouth 2 (two) times daily. 04/03/20   Lewie Chamber, MD  ticagrelor (BRILINTA) 90 MG TABS tablet TAKE 1 TABLET (90 MG TOTAL) BY MOUTH TWO TIMES DAILY. 04/03/20 04/03/21  Lewie ChamberGirguis, David, MD    Family History    Family History  Problem Relation Age of Onset  . Heart disease Neg Hx    She indicated that her mother is alive. She indicated that her father is alive. She indicated that her maternal grandmother is deceased. She indicated that her maternal grandfather is deceased. She indicated that her paternal grandmother is deceased. She indicated that her paternal grandfather is deceased. She indicated that the status of her neg hx is unknown.  Social History    Social History   Socioeconomic History  . Marital status: Single    Spouse name: Not on file  . Number of children: Not on file  . Years of education: Not on file  . Highest education level: Not on file  Occupational History  . Not on file   Tobacco Use  . Smoking status: Never Smoker  . Smokeless tobacco: Never Used  Vaping Use  . Vaping Use: Never used  Substance and Sexual Activity  . Alcohol use: No  . Drug use: No  . Sexual activity: Not on file  Other Topics Concern  . Not on file  Social History Narrative  . Not on file   Social Determinants of Health   Financial Resource Strain: Medium Risk  . Difficulty of Paying Living Expenses: Somewhat hard  Food Insecurity: No Food Insecurity  . Worried About Programme researcher, broadcasting/film/videounning Out of Food in the Last Year: Never true  . Ran Out of Food in the Last Year: Never true  Transportation Needs: No Transportation Needs  . Lack of Transportation (Medical): No  . Lack of Transportation (Non-Medical): No  Physical Activity: Inactive  . Days of Exercise per Week: 0 days  . Minutes of Exercise per Session: 0 min  Stress: Not on file  Social Connections: Not on file  Intimate Partner Violence: Not on file     Review of Systems    General:  No chills, fever, night sweats or weight changes.  Cardiovascular:  No chest pain, dyspnea on exertion, edema, orthopnea, palpitations, paroxysmal nocturnal dyspnea. Dermatological: No rash, lesions/masses Respiratory: No cough, dyspnea Urologic: No hematuria, dysuria Abdominal:   No nausea, vomiting, diarrhea, bright red blood per rectum, melena, or hematemesis Neurologic:  No visual changes, wkns, changes in mental status. All other systems reviewed and are otherwise negative except as noted above.  Physical Exam    VS:  BP 122/64   Pulse 72   Ht 5\' 6"  (1.676 m)   Wt (!) 349 lb 6.4 oz (158.5 kg)   SpO2 99%   BMI 56.39 kg/m  , BMI Body mass index is 56.39 kg/m. GEN: Well nourished, well developed, in no acute distress. HEENT: normal. Neck: Supple, no JVD, carotid bruits, or masses. Cardiac: RRR, no murmurs, rubs, or gallops. No clubbing, cyanosis, left lower extremity generalized nonpitting edema.  Radials/DP/PT 2+ and equal bilaterally.   Respiratory:  Respirations regular and unlabored, clear to auscultation bilaterally. GI: Soft, nontender, nondistended, BS + x 4. MS: no deformity or atrophy. Skin: warm and dry, no rash. Neuro:  Strength and sensation are intact. Psych: Normal affect.  Accessory Clinical Findings    Recent Labs: 03/28/2020: B Natriuretic Peptide 92.3; TSH 5.423 04/03/2020: Magnesium 2.7 04/21/2020: ALT 24; BUN 37; Creatinine, Ser 1.55; Hemoglobin 12.9; Platelets 245; Potassium 4.3; Sodium 138   Recent Lipid Panel    Component  Value Date/Time   CHOL 106 03/28/2020 0656   TRIG 110 03/28/2020 0656   HDL 27 (L) 03/28/2020 0656   CHOLHDL 3.9 03/28/2020 0656   VLDL 22 03/28/2020 0656   LDLCALC 57 03/28/2020 0656    ECG personally reviewed by me today-none today.  Echocardiogram 03/28/2020 IMPRESSIONS    1. Overall poor image quality even with definity Septal and apical  hypokinesis . Left ventricular ejection fraction, by estimation, is 50 to  55%. The left ventricle has low normal function. The left ventricle has no  regional wall motion abnormalities.  Left ventricular diastolic parameters were normal.  2. Right ventricular systolic function is normal. The right ventricular  size is normal.  3. Left atrial size was mildly dilated.  4. The mitral valve is normal in structure. No evidence of mitral valve  regurgitation. No evidence of mitral stenosis.  5. The aortic valve was not well visualized. Aortic valve regurgitation  is not visualized. No aortic stenosis is present.  6. The inferior vena cava is normal in size with greater than 50%  respiratory variability, suggesting right atrial pressure of 3 mmHg.  Cardiac catheterization 03/28/2020  Dist RCA lesion is 25% stenosed.  Mid LAD-1 lesion is 95% stenosed.  A drug-eluting stent was successfully placed using a STENT RESOLUTE ONYX [id number]. The proximal stent was postdilated with a 3.75 mm [place] baloon.  Post intervention, there is a 0%  residual stenosis.  Mid LAD-2 lesion is 50% stenosed.  LV end diastolic pressure is moderately elevated. LVEDP 27 mm Hg.  There is no aortic valve stenosis.   Continue dual antiplatelet therapy for 12 months. Consider clopidogrel monotherapy after 12 months, along with aggressive secondary prevention.    Increase statin to high potency.    Continue with plans for diuresis.  D/w Dr. Jens Som.  Diagnostic Dominance: Right    Intervention       Assessment & Plan   1.  NSTEMI- presented to the hospital 03/28/2020 with chest discomfort and dyspnea.  Underwent cardiac catheterization 03/28/2020 which showed LAD lesions x2.  She received mid LAD PCI and DES x1.  Details above. Continue aspirin, Brilinta, rosuvastatin, metoprolol Heart healthy low-sodium diet-salty 6 given Increase physical activity as tolerated  Chronic diastolic CHF- left lower extremity generalized nonpitting edema.  No increased DOE or activity intolerance.  Reports stable weight. Continue spironolactone, potassium, furosemide, metoprolol Heart healthy low-sodium diet-salty 6 given Increase physical activity as tolerated Daily weights-contact office with any increase of 3 pounds overnight or 5 pounds in 1 week  Support stockings sheet given.  Essential hypertension-BP today 122/64.  Well-controlled at home. Continue metoprolol, spironolactone Heart healthy low-sodium diet-salty 6 given Increase physical activity as tolerated BMP  Hyperlipidemia-03/28/2020: Cholesterol 106; HDL 27; LDL Cholesterol 57; Triglycerides 110; VLDL 22 Continue rosuvastatin Heart healthy low-sodium high-fiber diet Increase physical activity as tolerated  Type 2 diabetes- glucose 139 on 04/21/2020. Continue Novolin, metformin Recommend GLP-1 Follows with PCP  Morbid obesity-weight today 349 pounds.  Continues to increase physical activity Continue weight loss Follows with PCP  Disposition: Follow-up with Dr. Jens Som in 6  months.    Thomasene Ripple. Bruno Leach NP-C    06/06/2020, 10:49 AM [facility] [address] Office [phone] Fax [phone]  Notice: This dictation was prepared with Dragon dictation along with smaller phrase technology. Any transcriptional errors that result from this process are unintentional and may not be corrected upon review.  I spent 15 minutes examining this patient, reviewing  medications, and using patient centered shared decision making involving her cardiac care.  Prior to her visit I spent greater than 20 minutes reviewing her past medical history,  medications, and prior cardiac tests.

## 2020-06-06 ENCOUNTER — Other Ambulatory Visit: Payer: Self-pay

## 2020-06-06 ENCOUNTER — Ambulatory Visit (INDEPENDENT_AMBULATORY_CARE_PROVIDER_SITE_OTHER): Payer: 59 | Admitting: General Practice

## 2020-06-06 ENCOUNTER — Encounter: Payer: Self-pay | Admitting: General Practice

## 2020-06-06 VITALS — BP 122/64 | HR 72 | Ht 66.0 in | Wt 349.4 lb

## 2020-06-06 DIAGNOSIS — E782 Mixed hyperlipidemia: Secondary | ICD-10-CM

## 2020-06-06 DIAGNOSIS — E1169 Type 2 diabetes mellitus with other specified complication: Secondary | ICD-10-CM | POA: Diagnosis not present

## 2020-06-06 DIAGNOSIS — I214 Non-ST elevation (NSTEMI) myocardial infarction: Secondary | ICD-10-CM | POA: Diagnosis not present

## 2020-06-06 DIAGNOSIS — I10 Essential (primary) hypertension: Secondary | ICD-10-CM

## 2020-06-06 DIAGNOSIS — Z79899 Other long term (current) drug therapy: Secondary | ICD-10-CM

## 2020-06-06 DIAGNOSIS — I5032 Chronic diastolic (congestive) heart failure: Secondary | ICD-10-CM | POA: Diagnosis not present

## 2020-06-06 MED ORDER — METOPROLOL SUCCINATE ER 50 MG PO TB24: 50.0000 mg | ORAL_TABLET | Freq: Every day | ORAL | 11 refills | Status: DC

## 2020-06-06 NOTE — Patient Instructions (Signed)
Medication Instructions:  MAY TAKE EXTRA 1/2 METOPROLOL FOR YOUR PALPITATIONS *If you need a refill on your cardiac medications before your next appointment, please call your pharmacy*  Lab Work: BMET TODAY If you have labs (blood work) drawn today and your tests are completely normal, you will receive your results only by:  MyChart Message (if you have MyChart) OR A paper copy in the mail.  If you have any lab test that is abnormal or we need to change your treatment, we will call you to review the results. You may go to any [facility] that is convenient for you however, we do have a lab in our office that is able to assist you. You DO NOT need an appointment for our lab. The lab is open 8:00am and closes at 4:00pm. Lunch 12:45 - 1:45pm.  Special Instructions TAKE AND LOG YOUR WEIGHT DAILY AND CALL WITH WEIGHT >3lb/DAY OR >5lb/WEEK  PLEASE READ AND FOLLOW SALTY 6-ATTACHED-1,800 mg daily  PLEASE PURCHASE AND WEAR COMPRESSION STOCKINGS DAILY AND TAKE OFF AT BEDTIME. Compression stockings are elastic socks that squeeze the legs. They help to increase blood flow to the legs and to decrease swelling in the legs from fluid retention, and reduce the chance of developing blood clots in the lower legs. Please put on in the AM when dressing and off at night when dressing for bed.  LET THEM KNOW THAT YOU NEED KNEE HIGH'S WITH COMPRESSION OF 15-20 mmhg.  [employer]  [address] ([phone]); [place], [place] [address]; [phone]  EMAIL   [email or web address].  PLEASE MAKE SURE TO ELEVATE YOUR FEET & LEGS WHILE SITTING, THIS WILL HELP WITH THE SWELLING ALSO.   Follow-Up: Your next appointment:  6 month(s) In Person with [name], MD OR IF UNAVAILABLE JESSE CLEAVER, FNP-C   Please call our office 2 months in advance to schedule this appointment   At [facility], you and your health needs are our priority.  As part of our continuing mission to provide you with exceptional  heart care, we have created designated Provider Care Teams.  These Care Teams include your primary Cardiologist (physician) and Advanced Practice Providers (APPs -  Physician Assistants and Nurse Practitioners) who all work together to provide you with the care you need, when you need it.

## 2020-06-07 LAB — BASIC METABOLIC PANEL
BUN/Creatinine Ratio: 22 (ref 9–23)
BUN: 24 mg/dL (ref 6–24)
CO2: 26 mmol/L (ref 20–29)
Calcium: 9.3 mg/dL (ref 8.7–10.2)
Chloride: 96 mmol/L (ref 96–106)
Creatinine, Ser: 1.1 mg/dL — ABNORMAL HIGH (ref 0.57–1.00)
Glucose: 181 mg/dL — ABNORMAL HIGH (ref 65–99)
Potassium: 4.4 mmol/L (ref 3.5–5.2)
Sodium: 139 mmol/L (ref 134–144)
eGFR: 62 mL/min/{1.73_m2} (ref >59)

## 2020-06-24 ENCOUNTER — Other Ambulatory Visit: Payer: Self-pay | Admitting: Cardiology

## 2020-06-30 ENCOUNTER — Telehealth: Payer: Self-pay | Admitting: Cardiology

## 2020-06-30 ENCOUNTER — Other Ambulatory Visit: Payer: Self-pay

## 2020-06-30 MED ORDER — ROSUVASTATIN CALCIUM 20 MG PO TABS: 20.0000 mg | ORAL_TABLET | ORAL | 0 refills | Status: DC

## 2020-06-30 NOTE — Telephone Encounter (Signed)
rosuvastatin (CRESTOR) 20 MG tablet 39 tablet 0 06/30/2020    Sig - Route: Take 1 tablet (20 mg total) by mouth every Monday, Wednesday, and Friday. Increase to daily as able to tolerate - Oral   Sent to pharmacy as: rosuvastatin (CRESTOR) 20 MG tablet   E-Prescribing Status: Receipt confirmed by pharmacy (06/30/2020  9:38 AM EDT)     Pharmacy  [id number] - [place] ([place]), [place] - [address]

## 2020-06-30 NOTE — Telephone Encounter (Signed)
*  STAT* If patient is at the pharmacy, call can be transferred to refill team.   1. Which medications need to be refilled? (please list name of each medication and dose if known) new prescription for Rosuvastatin  2. Which pharmacy/location (including street and city if local pharmacy) is medication to be sent to?[facility] [place], [place], [place]  3. Do they need a 30 day or 90 day supply? 30 days

## 2020-08-02 ENCOUNTER — Other Ambulatory Visit: Payer: Self-pay | Admitting: Cardiology

## 2020-09-06 ENCOUNTER — Other Ambulatory Visit: Payer: Self-pay | Admitting: Cardiology

## 2020-10-09 ENCOUNTER — Telehealth: Payer: Self-pay | Admitting: Cardiology

## 2020-10-09 NOTE — Telephone Encounter (Signed)
Called patient back- noted she needed documentation proof, she would need a visit for this for her insurance- placed patient on the drawbridge schedule for NP to discuss and review her O2 level.   Patient verbalized understanding.

## 2020-10-09 NOTE — Telephone Encounter (Signed)
Caitlin Bunting, MD  Lindell Spar, RN Caller: Unspecified (Today,  9:59 AM) Will need documentation of O2 sat on room air to see if need still exists  [name]   Left message to call back

## 2020-10-09 NOTE — Telephone Encounter (Signed)
Message routed to MD to advise on letter of medical necessity for oxygen

## 2020-10-09 NOTE — Telephone Encounter (Signed)
Follow Up:      Patient is retuning call from today.

## 2020-10-09 NOTE — Telephone Encounter (Signed)
Name:      Patient says she needs a letter for her insurance company stating that that she  needs oxygen. They will not continue to pay for  this, if she does not get this letter.

## 2020-10-11 NOTE — Progress Notes (Addendum)
Cardiology Office Note:    Date:  10/18/2020   ID:  [name], DOB 26-Oct-1970, MRN [id number]  PCP:  Elizabeth Palau, FNP   [facility] Providers Cardiologist:  [name], MD      Referring MD: Elizabeth Palau, FNP   Follow-up evaluation of her acute on chronic diastolic CHF, and CAD.  History of Present Illness:    [name] is a 50 y.o. female with a hx of hypertension, GERD, depression, hypothyroidism, morbid obesity, uncontrolled type 2 diabetes, and chronic diastolic CHF.   She was admitted to hospital with chest discomfort and on 03/28/2020.  Her high-sensitivity troponins were elevated at 514 and climbed to over 2000.  Her EKG showed nonspecific changes.  She was diagnosed with NSTEMI and taken to the Cath Lab.  Cardiac catheterization showed 25% distal RCA, mid LAD 95%, mid LAD-to 50%, moderately elevated LVEDP at 27 mmHg.  She received PCI with DES x1 to her mid LAD.  Her echocardiogram at that time showed normal LVEF, septal/apical hypokinesis, normal diastolic function, no valvular abnormalities and normal right ventricular function.   She followed up with heart and vascular transition of care clinic 04/06/2020.  At that time she reported she felt achy since being discharged from the hospital.  She denied chest pain except with deep inhalation.  Her discomfort resolved with exhalation.  She reported overall feeling better.  Her medications were reviewed.  It was noted that her pillbox was not in order, containing days with missing doses of certain medications and multiple doses on other days.  She reported having very poor vision and having difficulty with her pill bottles.   She presented to the clinic 06/06/20 for follow-up evaluation stated she felt fairly well.  She did report that she had increased anxiety related to doctors visits and medical procedures.  She ha noticed occasional brief episodes of sharp chest pain.  She reported that the pain could be  reproduced with pressing over the area.  I educated her that this was related to muscle wall pain.  She expressed understanding.  She reported that she did have occasional brief episodes of palpitations.  She reported that these would happen a couple times a week.  She also reported that she had presented to the emergency department with complaints of lower extremity swelling.  She was instructed to take extra doses of her furosemide.  She reported that she followed a low-sodium diet and restricted her fluid.  She was breathing much better with 2 L of oxygen via nasal cannula.  We gave  her the [place] support stocking sheet, salty 6 diet sheet, have her increase her physical activity as tolerated, continue current medication regimen and order a BMP.   Follow-up was planned for 6 months with Dr. Jens Som.  She presents to the clinic today for follow-up evaluation states she has noticed that she is more fatigued over the last several months.  She notices that she continues to need her oxygen.  She reports that when she is asleep at night and her oxygen falls off her saturation will drop into the 70s when she wakes up to check it.  She is maintaining a heart healthy low-sodium diet.  She reports that she does have some lower extremity numbness and that she has a herniated lumbar disc.  She continues to have some right-sided/shoulder area discomfort as well as left-sided shoulder discomfort.  This appears to be related to deconditioning and musculoskeletal pain related to the use of the  walker.  Her weight has increased about 10-11 pounds.  She denies any bleeding issues.  She performed a walking test without her oxygen and her oxygen saturation dropped to 87 after only ambulating 10-12 feet.  I will order a sleep study, send my note to her insurance company so that she may maintain her oxygen therapy, have her increase her physical activity, repeat an echocardiogram, and have her follow-up after her sleep study.   We reviewed the importance of weight loss and increasing physical activity.  I recommended that she start doing chair type exercises multiple times per day.   Today she denies chest pain, shortness of breath, lower extremity edema, fatigue, palpitations, melena, hematuria, hemoptysis, diaphoresis, weakness, presyncope, syncope, orthopnea, and PND.    Past Medical History:  Diagnosis Date   DDD (degenerative disc disease), lumbar    Diabetes mellitus without complication (HCC)    Hypertension    Lumbar herniated disc     Past Surgical History:  Procedure Laterality Date   CORONARY STENT INTERVENTION N/A 03/28/2020   Procedure: CORONARY STENT INTERVENTION;  Surgeon: Corky Crafts, MD;  Location: [facility] INVASIVE CV LAB;  Service: Cardiovascular;  Laterality: N/A;   DILATION AND CURETTAGE OF UTERUS     INCISION AND DRAINAGE PERIRECTAL ABSCESS  06/21/2014   INCISION AND DRAINAGE PERIRECTAL ABSCESS Bilateral 06/21/2014   Procedure: IRRIGATION AND DEBRIDEMENT PERIRECTAL ABSCESS;  Surgeon: Abigail Miyamoto, MD;  Location: [facility] OR;  Service: General;  Laterality: Bilateral;   LEFT HEART CATH AND CORONARY ANGIOGRAPHY N/A 03/28/2020   Procedure: LEFT HEART CATH AND CORONARY ANGIOGRAPHY;  Surgeon: Corky Crafts, MD;  Location: [facility] INVASIVE CV LAB;  Service: Cardiovascular;  Laterality: N/A;    Current Medications: Current Meds  Medication Sig   acetaminophen (TYLENOL) 500 MG tablet Take 1,000 mg by mouth every 6 (six) hours as needed for moderate pain or headache.   aspirin 81 MG chewable tablet Chew 1 tablet (81 mg total) by mouth daily.   clopidogrel (PLAVIX) 75 MG tablet Take 1 tablet by mouth once daily   DULoxetine (CYMBALTA) 30 MG capsule Take 60 mg by mouth at bedtime.   furosemide (LASIX) 80 MG tablet Take 1 tablet (80 mg total) by mouth daily.   gabapentin (NEURONTIN) 300 MG capsule Take 600 mg by mouth in the morning, at noon, and at bedtime.   levothyroxine (SYNTHROID, LEVOTHROID)  50 MCG tablet Take 50 mcg by mouth daily.   meclizine (ANTIVERT) 25 MG tablet Take 1-2 tablets (25-50 mg total) by mouth 3 (three) times daily as needed for dizziness.   metFORMIN (GLUCOPHAGE) 1000 MG tablet Take 1,000 mg by mouth 2 (two) times daily.   metoprolol succinate (TOPROL XL) 50 MG 24 hr tablet Take 1 tablet (50 mg total) by mouth daily. Take with or immediately following a meal. MAY TAKE EXTRA 25MG  FOR PALPITATIONS   NOVOLIN 70/30 RELION (70-30) 100 UNIT/ML injection Inject 125-135 Units into the skin See admin instructions. Use 125 units every morning and use 135 units every evening   nystatin (MYCOSTATIN/NYSTOP) powder Apply 1 application topically in the morning, at noon, in the evening, and at bedtime.   nystatin cream (MYCOSTATIN) Apply 1 application topically in the morning, at noon, in the evening, and at bedtime. Abdominal folds   omeprazole (PRILOSEC) 40 MG capsule Take 40 mg by mouth 2 (two) times daily.   ondansetron (ZOFRAN ODT) 8 MG disintegrating tablet Take 1 tablet (8 mg total) by mouth every 8 (eight) hours as needed  for nausea or vomiting.   potassium chloride (KLOR-CON) 10 MEQ tablet Take 1 tablet (10 mEq total) by mouth daily.   rosuvastatin (CRESTOR) 20 MG tablet Take 1 tablet (20 mg total) by mouth every Monday, Wednesday, and Friday. Increase to daily as able to tolerate   spironolactone (ALDACTONE) 25 MG tablet Take 1 tablet (25 mg total) by mouth daily.     Allergies:   Clindamycin/lincomycin, Latex, and Ultram [tramadol]   Social History   Socioeconomic History   Marital status: Single    Spouse name: Not on file   Number of children: Not on file   Years of education: Not on file   Highest education level: Not on file  Occupational History   Not on file  Tobacco Use   Smoking status: Never   Smokeless tobacco: Never  Vaping Use   Vaping Use: Never used  Substance and Sexual Activity   Alcohol use: No   Drug use: No   Sexual activity: Not on file   Other Topics Concern   Not on file  Social History Narrative   Not on file   Social Determinants of Health   Financial Resource Strain: Medium Risk   Difficulty of Paying Living Expenses: Somewhat hard  Food Insecurity: No Food Insecurity   Worried About Running Out of Food in the Last Year: Never true   Ran Out of Food in the Last Year: Never true  Transportation Needs: No Transportation Needs   Lack of Transportation (Medical): No   Lack of Transportation (Non-Medical): No  Physical Activity: Inactive   Days of Exercise per Week: 0 days   Minutes of Exercise per Session: 0 min  Stress: Not on file  Social Connections: Not on file     Family History: The patient's family history is negative for Heart disease.  ROS:   Please see the history of present illness.     All other systems reviewed and are negative.   Risk Assessment/Calculations:           Physical Exam:    VS:  BP (!) 144/60   Pulse (!) 102   Ht 5\' 6"  (1.676 m)   Wt (!) 361 lb (163.7 kg)   SpO2 (!) 87% Comment: on room air  BMI 58.27 kg/m     Wt Readings from Last 3 Encounters:  10/18/20 (!) 361 lb (163.7 kg)  06/06/20 (!) 349 lb 6.4 oz (158.5 kg)  04/14/20 (!) 371 lb 12.8 oz (168.6 kg)     GEN:  Well nourished, well developed in no acute distress HEENT: Normal NECK: No JVD; No carotid bruits LYMPHATICS: No lymphadenopathy CARDIAC: RRR, no murmurs, rubs, gallops RESPIRATORY:  Clear to auscultation without rales, wheezing or rhonchi  ABDOMEN: Soft, non-tender, non-distended MUSCULOSKELETAL:  No edema; No deformity  SKIN: Warm and dry NEUROLOGIC:  Alert and oriented x 3 PSYCHIATRIC:  Normal affect    EKGs/Labs/Other Studies Reviewed:    The following studies were reviewed today:  Echocardiogram 03/28/2020 IMPRESSIONS     1. Overall poor image quality even with definity Septal and apical  hypokinesis . Left ventricular ejection fraction, by estimation, is 50 to  55%. The left ventricle  has low normal function. The left ventricle has no  regional wall motion abnormalities.  Left ventricular diastolic parameters were normal.   2. Right ventricular systolic function is normal. The right ventricular  size is normal.   3. Left atrial size was mildly dilated.   4. The mitral valve  is normal in structure. No evidence of mitral valve  regurgitation. No evidence of mitral stenosis.   5. The aortic valve was not well visualized. Aortic valve regurgitation  is not visualized. No aortic stenosis is present.   6. The inferior vena cava is normal in size with greater than 50%  respiratory variability, suggesting right atrial pressure of 3 mmHg.   Cardiac catheterization 03/28/2020 Dist RCA lesion is 25% stenosed. Mid LAD-1 lesion is 95% stenosed. A drug-eluting stent was successfully placed using a STENT RESOLUTE ONYX [id number]. The proximal stent was postdilated with a 3.75 mm [place] baloon. Post intervention, there is a 0% residual stenosis. Mid LAD-2 lesion is 50% stenosed. LV end diastolic pressure is moderately elevated. LVEDP 27 mm Hg. There is no aortic valve stenosis.   Continue dual antiplatelet therapy for 12 months. Consider clopidogrel monotherapy after 12 months, along with aggressive secondary prevention.     Increase statin to high potency.     Continue with plans for diuresis.  D/w Dr. Jens Som.  Diagnostic Dominance: Right     Intervention          EKG: None today.  Recent Labs: 03/28/2020: B Natriuretic Peptide 92.3; TSH 5.423 04/03/2020: Magnesium 2.7 04/21/2020: ALT 24; Hemoglobin 12.9; Platelets 245 06/06/2020: BUN 24; Creatinine, Ser 1.10; Potassium 4.4; Sodium 139  Recent Lipid Panel    Component Value Date/Time   CHOL 106 03/28/2020 0656   TRIG 110 03/28/2020 0656   HDL 27 (L) 03/28/2020 0656   CHOLHDL 3.9 03/28/2020 0656   VLDL 22 03/28/2020 0656   LDLCALC 57 03/28/2020 0656    ASSESSMENT & PLAN   Chronic diastolic CHF-euvolemic today.    Continues to have dyspnea with increased physical activity.  Weight today 361 Continue spironolactone, potassium, furosemide, metoprolol Heart healthy low-sodium diet-salty 6 given Increase physical activity as tolerated Daily weights-contact office with any increase of 3 pounds overnight or 5 pounds in 1 week  Continue lower extremity support stockings Repeat echocardiogram  Fatigue/DOE-reports oxygen saturations dropping into the 70s at night when her oxygen comes off.  Reports daytime somnolence.  At rest oxygen saturation 99% on 2 L, blood pressure 139/58, pulse 74.  With oxygen and ambulation on 2 L oxygen saturation 97%, pulse 112-93 bpm, blood pressure 138/58.  With no oxygen her oxygen saturation drops to 87 after only ambulating 10-12 feet, pulse 102-87 bpm and blood pressure 144/60.  Epworth sleep scale completed. Order split-night sleep study Will forward note to insurance Continue 2 L of O2 Patient/Case discussed with DOD  Coronary artery disease-no chest pain today.  Presented to the hospital 03/28/2020 with chest discomfort and dyspnea.  Underwent cardiac catheterization 03/28/2020 which showed LAD lesions x2.  She received mid LAD PCI and DES x1.  Details above. Continue aspirin, Brilinta, rosuvastatin, metoprolol Heart healthy low-sodium diet-salty 6 given Increase physical activity as tolerated     Essential hypertension-BP today 139/58.  Well-controlled at home. Continue metoprolol, spironolactone Heart healthy low-sodium diet-salty 6 given Increase physical activity as tolerated    Hyperlipidemia-03/28/2020: Cholesterol 106; HDL 27; LDL Cholesterol 57; Triglycerides 110; VLDL 22 Continue rosuvastatin Heart healthy low-sodium high-fiber diet Increase physical activity as tolerated Repeat fasting lipids and LFTs 3/23  Type 2 diabetes- glucose 181 on 06/06/2020 Continue Novolin, metformin Follows with PCP   Morbid obesity-weight today 361 pounds.  Limited her mobility due  to low back pain and lower extremity numbness. Continue weight loss Follows with PCP   Disposition: Follow-up with Dr. Jens Som in  2-3 months.        Medication Adjustments/Labs and Tests Ordered: Current medicines are reviewed at length with the patient today.  Concerns regarding medicines are outlined above.  Orders Placed This Encounter  Procedures   EKG 12-Lead   ECHOCARDIOGRAM COMPLETE   Split night study   No orders of the defined types were placed in this encounter.   Patient Instructions  Medication Instructions:  Your physician recommends that you continue on your current medications as directed. Please refer to the Current Medication list given to you today.  *If you need a refill on your cardiac medications before your next appointment, please call your pharmacy*   Testing/Procedures: Your physician has requested that you have an echocardiogram. Echocardiography is a painless test that uses sound waves to create images of your heart. It provides your doctor with information about the size and shape of your heart and how well your heart's chambers and valves are working. This procedure takes approximately one hour. There are no restrictions for this procedure.  Your physician has recommended that you have a sleep study. This test records several body functions during sleep, including: brain activity, eye movement, oxygen and carbon dioxide blood levels, heart rate and rhythm, breathing rate and rhythm, the flow of air through your mouth and nose, snoring, body muscle movements, and chest and belly movement.   Follow-Up: At [facility], you and your health needs are our priority.  As part of our continuing mission to provide you with exceptional heart care, we have created designated Provider Care Teams.  These Care Teams include your primary Cardiologist (physician) and Advanced Practice Providers (APPs -  Physician Assistants and Nurse Practitioners) who all work  together to provide you with the care you need, when you need it.  We recommend signing up for the patient portal called "MyChart".  Sign up information is provided on this After Visit Summary.  MyChart is used to connect with patients for Virtual Visits (Telemedicine).  Patients are able to view lab/test results, encounter notes, upcoming appointments, etc.  Non-urgent messages can be sent to your provider as well.   To learn more about what you can do with MyChart, go to [email or web address].    Your next appointment:   2-3 month(s)  The format for your next appointment:   In Person  Provider:   You may see [name], MD or one of the following Advanced Practice Providers on your designated Care Team:   Marjie Skiff, PA-C Edd Fabian, FNP   Other Instructions We will fax Fancy Dunkley's note to your insurance for your oxygen.   Signed, Ronney Asters, NP  10/18/2020 1:06 PM      Notice: This dictation was prepared with Dragon dictation along with smaller phrase technology. Any transcriptional errors that result from this process are unintentional and may not be corrected upon review.  I spent 25 minutes examining this patient, reviewing medications, and using patient centered shared decision making involving her cardiac care.  Prior to her visit I spent greater than 20 minutes reviewing her past medical history,  medications, and prior cardiac tests.

## 2020-10-18 ENCOUNTER — Encounter (HOSPITAL_BASED_OUTPATIENT_CLINIC_OR_DEPARTMENT_OTHER): Payer: Self-pay | Admitting: General Practice

## 2020-10-18 ENCOUNTER — Other Ambulatory Visit: Payer: Self-pay

## 2020-10-18 ENCOUNTER — Telehealth (HOSPITAL_BASED_OUTPATIENT_CLINIC_OR_DEPARTMENT_OTHER): Payer: Self-pay | Admitting: General Practice

## 2020-10-18 ENCOUNTER — Ambulatory Visit (INDEPENDENT_AMBULATORY_CARE_PROVIDER_SITE_OTHER): Payer: 59 | Admitting: General Practice

## 2020-10-18 VITALS — BP 144/60 | HR 102 | Ht 66.0 in | Wt 361.0 lb

## 2020-10-18 DIAGNOSIS — E1169 Type 2 diabetes mellitus with other specified complication: Secondary | ICD-10-CM

## 2020-10-18 DIAGNOSIS — Z794 Long term (current) use of insulin: Secondary | ICD-10-CM

## 2020-10-18 DIAGNOSIS — I2510 Atherosclerotic heart disease of native coronary artery without angina pectoris: Secondary | ICD-10-CM

## 2020-10-18 DIAGNOSIS — I10 Essential (primary) hypertension: Secondary | ICD-10-CM | POA: Diagnosis not present

## 2020-10-18 DIAGNOSIS — E1165 Type 2 diabetes mellitus with hyperglycemia: Secondary | ICD-10-CM

## 2020-10-18 DIAGNOSIS — I5032 Chronic diastolic (congestive) heart failure: Secondary | ICD-10-CM

## 2020-10-18 DIAGNOSIS — E782 Mixed hyperlipidemia: Secondary | ICD-10-CM

## 2020-10-18 NOTE — Patient Instructions (Signed)
Medication Instructions:  Your physician recommends that you continue on your current medications as directed. Please refer to the Current Medication list given to you today.  *If you need a refill on your cardiac medications before your next appointment, please call your pharmacy*   Testing/Procedures: Your physician has requested that you have an echocardiogram. Echocardiography is a painless test that uses sound waves to create images of your heart. It provides your doctor with information about the size and shape of your heart and how well your heart's chambers and valves are working. This procedure takes approximately one hour. There are no restrictions for this procedure.  Your physician has recommended that you have a sleep study. This test records several body functions during sleep, including: brain activity, eye movement, oxygen and carbon dioxide blood levels, heart rate and rhythm, breathing rate and rhythm, the flow of air through your mouth and nose, snoring, body muscle movements, and chest and belly movement.   Follow-Up: At [facility], you and your health needs are our priority.  As part of our continuing mission to provide you with exceptional heart care, we have created designated Provider Care Teams.  These Care Teams include your primary Cardiologist (physician) and Advanced Practice Providers (APPs -  Physician Assistants and Nurse Practitioners) who all work together to provide you with the care you need, when you need it.  We recommend signing up for the patient portal called "MyChart".  Sign up information is provided on this After Visit Summary.  MyChart is used to connect with patients for Virtual Visits (Telemedicine).  Patients are able to view lab/test results, encounter notes, upcoming appointments, etc.  Non-urgent messages can be sent to your provider as well.   To learn more about what you can do with MyChart, go to [email or web address].    Your next  appointment:   2-3 month(s)  The format for your next appointment:   In Person  Provider:   You may see [name], MD or one of the following Advanced Practice Providers on your designated Care Team:   Marjie Skiff, PA-C Edd Fabian, FNP   Other Instructions We will fax Jesse's note to your insurance for your oxygen.

## 2020-10-18 NOTE — Telephone Encounter (Signed)
Split night study ordered for patient today by Edd Fabian, NP.

## 2020-10-24 NOTE — Telephone Encounter (Signed)
Prior Authorization for split night sleep study sent to [facility] via web portal. Tracking Number [id number].

## 2020-10-26 ENCOUNTER — Other Ambulatory Visit: Payer: Self-pay | Admitting: Orthopedic Surgery

## 2020-10-26 DIAGNOSIS — M25552 Pain in left hip: Secondary | ICD-10-CM

## 2020-10-27 ENCOUNTER — Other Ambulatory Visit (HOSPITAL_BASED_OUTPATIENT_CLINIC_OR_DEPARTMENT_OTHER): Payer: 59

## 2020-10-27 NOTE — Telephone Encounter (Signed)
PA received for split night sleep study. This has been scheduled for November 6th at [facility] sleep disorders. Auth # [id number]. Valid dates 11/06/20 to 01/06/21.

## 2020-10-30 ENCOUNTER — Other Ambulatory Visit (HOSPITAL_BASED_OUTPATIENT_CLINIC_OR_DEPARTMENT_OTHER): Payer: 59

## 2020-11-10 ENCOUNTER — Other Ambulatory Visit: Payer: Self-pay

## 2020-11-10 ENCOUNTER — Ambulatory Visit (INDEPENDENT_AMBULATORY_CARE_PROVIDER_SITE_OTHER): Payer: 59

## 2020-11-10 DIAGNOSIS — E1169 Type 2 diabetes mellitus with other specified complication: Secondary | ICD-10-CM

## 2020-11-10 DIAGNOSIS — Z794 Long term (current) use of insulin: Secondary | ICD-10-CM

## 2020-11-10 DIAGNOSIS — I5032 Chronic diastolic (congestive) heart failure: Secondary | ICD-10-CM

## 2020-11-10 DIAGNOSIS — E782 Mixed hyperlipidemia: Secondary | ICD-10-CM

## 2020-11-10 DIAGNOSIS — I10 Essential (primary) hypertension: Secondary | ICD-10-CM | POA: Diagnosis not present

## 2020-11-10 DIAGNOSIS — I2510 Atherosclerotic heart disease of native coronary artery without angina pectoris: Secondary | ICD-10-CM

## 2020-11-10 DIAGNOSIS — E1165 Type 2 diabetes mellitus with hyperglycemia: Secondary | ICD-10-CM

## 2020-11-10 LAB — ECHOCARDIOGRAM COMPLETE
AR max vel: 2.61 cm2
AV Area VTI: 2.8 cm2
AV Area mean vel: 2.89 cm2
AV Mean grad: 5 mmHg
AV Peak grad: 10.1 mmHg
Ao pk vel: 1.59 m/s
Area-P 1/2: 5.27 cm2
S' Lateral: 3.08 cm

## 2020-11-10 MED ORDER — PERFLUTREN LIPID MICROSPHERE
1.0000 mL | INTRAVENOUS | Status: AC | PRN
  Administered 2020-11-10: 3 mL via INTRAVENOUS

## 2020-11-20 ENCOUNTER — Telehealth: Payer: Self-pay | Admitting: Cardiology

## 2020-11-20 NOTE — Telephone Encounter (Signed)
Spoke with patient regarding the following results. Patient made aware and patient verbalized understanding.   Ronney Asters, NP  11/10/2020  3:53 PM EDT     Please contact Ms. Kotara and let her know that her echocardiogram has been reviewed.  It showed normal pumping function and no significant valvular abnormalities.  Her pumping function has improved from 50% back to normal.  No further testing is needed at this time.  Thank you.   Advised patient to call back to office with any issues, questions, or concerns. Patient verbalized understanding.

## 2020-11-20 NOTE — Telephone Encounter (Signed)
Patient is returning call to discuss echo results. °

## 2020-11-21 NOTE — Telephone Encounter (Signed)
LETTER ALREADY MAILED TO PT-SEE ECHO RESULT

## 2020-12-03 ENCOUNTER — Encounter (HOSPITAL_BASED_OUTPATIENT_CLINIC_OR_DEPARTMENT_OTHER): Payer: 59 | Admitting: Cardiovascular Disease

## 2020-12-07 ENCOUNTER — Other Ambulatory Visit: Payer: Self-pay | Admitting: General Practice

## 2020-12-13 ENCOUNTER — Other Ambulatory Visit: Payer: 59

## 2020-12-18 ENCOUNTER — Other Ambulatory Visit: Payer: 59

## 2021-01-01 ENCOUNTER — Other Ambulatory Visit: Payer: Self-pay

## 2021-01-01 ENCOUNTER — Encounter: Payer: Self-pay | Admitting: Endocrinology

## 2021-01-01 ENCOUNTER — Ambulatory Visit (INDEPENDENT_AMBULATORY_CARE_PROVIDER_SITE_OTHER): Payer: 59 | Admitting: Endocrinology

## 2021-01-01 VITALS — BP 118/46 | HR 77 | Ht 66.0 in | Wt 365.8 lb

## 2021-01-01 DIAGNOSIS — Z794 Long term (current) use of insulin: Secondary | ICD-10-CM

## 2021-01-01 DIAGNOSIS — E1165 Type 2 diabetes mellitus with hyperglycemia: Secondary | ICD-10-CM

## 2021-01-01 LAB — POCT GLYCOSYLATED HEMOGLOBIN (HGB A1C): Hemoglobin A1C: 9.7 % — AB (ref 4.0–5.6)

## 2021-01-01 MED ORDER — METFORMIN HCL ER 500 MG PO TB24: 2000.0000 mg | ORAL_TABLET | Freq: Every day | ORAL | 3 refills | Status: DC

## 2021-01-01 MED ORDER — DAPAGLIFLOZIN PROPANEDIOL 10 MG PO TABS: 10.0000 mg | ORAL_TABLET | Freq: Every day | ORAL | 3 refills | Status: DC

## 2021-01-01 NOTE — Progress Notes (Signed)
Subjective:    Patient ID: [name], female    DOB: [birth year], 50 y.o.   MRN: [id number]  HPI pt is referred by Elizabeth Palau, NP, for diabetes.  Pt states DM was dx'ed in 1999; it is complicated by CAD, Charcot foot, DR, and PN; she has been on insulin since 2008; pt says her diet is not good, and exercise is limited by health probs; she has never had GDM, pancreatitis, pancreatic surgery, severe hypoglycemia or DKA.  She takes Novolin 70/30 125 units QAM, and 135 units QPM, and metformin.  She has chronic nausea.  Pt says cbg varies from 154-375. There is no trend throughout the day.  Pt says she cannot afford brand name meds.   Past Medical History:  Diagnosis Date   DDD (degenerative disc disease), lumbar    Diabetes mellitus without complication (HCC)    Hypertension    Lumbar herniated disc     Past Surgical History:  Procedure Laterality Date   CORONARY STENT INTERVENTION N/A 03/28/2020   Procedure: CORONARY STENT INTERVENTION;  Surgeon: Corky Crafts, MD;  Location: [facility] INVASIVE CV [facility];  Service: Cardiovascular;  Laterality: N/A;   DILATION AND CURETTAGE OF UTERUS     INCISION AND DRAINAGE PERIRECTAL ABSCESS  06/21/2014   INCISION AND DRAINAGE PERIRECTAL ABSCESS Bilateral 06/21/2014   Procedure: IRRIGATION AND DEBRIDEMENT PERIRECTAL ABSCESS;  Surgeon: Abigail Miyamoto, MD;  Location: [facility] OR;  Service: General;  Laterality: Bilateral;   LEFT HEART CATH AND CORONARY ANGIOGRAPHY N/A 03/28/2020   Procedure: LEFT HEART CATH AND CORONARY ANGIOGRAPHY;  Surgeon: Corky Crafts, MD;  Location: [facility] INVASIVE CV LAB;  Service: Cardiovascular;  Laterality: N/A;    Social History   Socioeconomic History   Marital status: Single    Spouse name: Not on file   Number of children: Not on file   Years of education: Not on file   Highest education level: Not on file  Occupational History   Not on file  Tobacco Use   Smoking status: Never   Smokeless tobacco: Never  Vaping  Use   Vaping Use: Never used  Substance and Sexual Activity   Alcohol use: No   Drug use: No   Sexual activity: Not on file  Other Topics Concern   Not on file  Social History Narrative   Not on file   Social Determinants of Health   Financial Resource Strain: Medium Risk   Difficulty of Paying Living Expenses: Somewhat hard  Food Insecurity: No Food Insecurity   Worried About Running Out of Food in the Last Year: Never true   Ran Out of Food in the Last Year: Never true  Transportation Needs: No Transportation Needs   Lack of Transportation (Medical): No   Lack of Transportation (Non-Medical): No  Physical Activity: Inactive   Days of Exercise per Week: 0 days   Minutes of Exercise per Session: 0 min  Stress: Not on file  Social Connections: Not on file  Intimate Partner Violence: Not on file    Current Outpatient Medications on File Prior to Visit  Medication Sig Dispense Refill   acetaminophen (TYLENOL) 500 MG tablet Take 1,000 mg by mouth every 6 (six) hours as needed for moderate pain or headache.     aspirin 81 MG chewable tablet Chew 1 tablet (81 mg total) by mouth daily.     clopidogrel (PLAVIX) 75 MG tablet Take 1 tablet by mouth once daily 90 tablet 1   DULoxetine (CYMBALTA) 30 MG  capsule Take 60 mg by mouth at bedtime.     furosemide (LASIX) 80 MG tablet Take 1 tablet (80 mg total) by mouth daily. 30 tablet 5   gabapentin (NEURONTIN) 300 MG capsule Take 600 mg by mouth in the morning, at noon, and at bedtime.     levothyroxine (SYNTHROID, LEVOTHROID) 50 MCG tablet Take 50 mcg by mouth daily.  3   meclizine (ANTIVERT) 25 MG tablet Take 1-2 tablets (25-50 mg total) by mouth 3 (three) times daily as needed for dizziness. 30 tablet 0   metoprolol succinate (TOPROL XL) 50 MG 24 hr tablet Take 1 tablet (50 mg total) by mouth daily. Take with or immediately following a meal. MAY TAKE EXTRA 25MG  FOR PALPITATIONS 45 tablet 11   NOVOLIN 70/30 RELION (70-30) 100 UNIT/ML  injection Inject 125-135 Units into the skin See admin instructions. Use 125 units every morning and use 135 units every evening     nystatin (MYCOSTATIN/NYSTOP) powder Apply 1 application topically in the morning, at noon, in the evening, and at bedtime.     nystatin cream (MYCOSTATIN) Apply 1 application topically in the morning, at noon, in the evening, and at bedtime. Abdominal folds     omeprazole (PRILOSEC) 40 MG capsule Take 40 mg by mouth 2 (two) times daily.     ondansetron (ZOFRAN ODT) 8 MG disintegrating tablet Take 1 tablet (8 mg total) by mouth every 8 (eight) hours as needed for nausea or vomiting. 20 tablet 0   potassium chloride (KLOR-CON) 10 MEQ tablet Take 1 tablet by mouth once daily 30 tablet 5   rosuvastatin (CRESTOR) 20 MG tablet Take 1 tablet (20 mg total) by mouth every Monday, Wednesday, and Friday. Increase to daily as able to tolerate 39 tablet 0   spironolactone (ALDACTONE) 25 MG tablet Take 1 tablet by mouth once daily 30 tablet 5   No current facility-administered medications on file prior to visit.    Allergies  Allergen Reactions   Clindamycin/Lincomycin Other (See Comments) and Nausea And Vomiting    Stomach pain    Latex Rash   Ultram [Tramadol] Rash    Family History  Problem Relation Age of Onset   Diabetes Mother    Diabetes Father    Heart disease Neg Hx     BP (!) 118/46   Pulse 77   Ht 5\' 6"  (1.676 m)   Wt (!) 365 lb 12.8 oz (165.9 kg)   SpO2 95%   BMI 59.04 kg/m     Review of Systems denies sob, memory loss.  She has chronic sob.  She has gained 100 lbs x 3 years.        Objective:   Physical Exam VITAL SIGNS:  See vs page GENERAL: no distress.  In WC.  Has 02 on Pulses: dorsalis pedis intact bilat.   MSK: no deformity of the feet CV: 1+ bilat leg edema Skin:  no ulcer on the feet, but the skin is dry.  normal color and temp on the feet. Neuro: sensation is intact to touch on the feet, but decreased from normal.   Ext: there  is bilateral onychomycosis of the toenails.     Lab Results  Component Value Date   CREATININE 1.10 (H) 06/06/2020   BUN 24 06/06/2020   NA 139 06/06/2020   K 4.4 06/06/2020   CL 96 06/06/2020   CO2 26 06/06/2020   Lab Results  Component Value Date   HGBA1C 9.7 (A) 01/01/2021   I  have reviewed outside records, and summarized: Pt was noted to have elevated A1c, and referred here.  She was noted to have many serious med probs.      Assessment & Plan:  Insulin-requiring type 2 DM: uncontrolled Nausea, due to metformin.  We have to lessen this before starting GLP.   Patient Instructions  good diet significantly improve the control of your diabetes.  please let me know if you wish to be referred to a dietician.  high blood sugar is very risky to your health.  you should see an eye doctor and dentist every year.  It is very important to get all recommended vaccinations.  Controlling your blood pressure and cholesterol drastically reduces the damage diabetes does to your body.  Those who smoke should quit.  Please discuss these with your doctor.  check your blood sugar twice a day.  vary the time of day when you check, between before the 3 meals, and at bedtime.  also check if you have symptoms of your blood sugar being too high or too low.  please keep a record of the readings and bring it to your next appointment here (or you can bring the meter itself).  You can write it on any piece of paper.  please call us sooner if your blood sugar goes below 70, or if most of your readings are over 200.   We will need to take this complex situation in stages.   I have sent 2 prescriptions to your pharmacy: to change the metformin to extended-release, and to add [place].    Please come back for a follow-up appointment in January.

## 2021-01-01 NOTE — Patient Instructions (Addendum)
good diet significantly improve the control of your diabetes.  please let me know if you wish to be referred to a dietician.  high blood sugar is very risky to your health.  you should see an eye doctor and dentist every year.  It is very important to get all recommended vaccinations.  Controlling your blood pressure and cholesterol drastically reduces the damage diabetes does to your body.  Those who smoke should quit.  Please discuss these with your doctor.  check your blood sugar twice a day.  vary the time of day when you check, between before the 3 meals, and at bedtime.  also check if you have symptoms of your blood sugar being too high or too low.  please keep a record of the readings and bring it to your next appointment here (or you can bring the meter itself).  You can write it on any piece of paper.  please call us sooner if your blood sugar goes below 70, or if most of your readings are over 200.   We will need to take this complex situation in stages.   I have sent 2 prescriptions to your pharmacy: to change the metformin to extended-release, and to add [place].    Please come back for a follow-up appointment in January.

## 2021-01-02 LAB — TSH: TSH: 2.04 u[IU]/mL (ref 0.35–5.50)

## 2021-01-02 LAB — T4, FREE: Free T4: 0.9 ng/dL (ref 0.60–1.60)

## 2021-01-04 ENCOUNTER — Encounter (HOSPITAL_BASED_OUTPATIENT_CLINIC_OR_DEPARTMENT_OTHER): Payer: 59 | Admitting: Cardiovascular Disease

## 2021-01-06 ENCOUNTER — Other Ambulatory Visit: Payer: Self-pay

## 2021-01-06 ENCOUNTER — Ambulatory Visit
Admission: RE | Admit: 2021-01-06 | Discharge: 2021-01-06 | Disposition: A | Discharge status: Home / Self Care | Payer: 59 | Source: Ambulatory Visit | Attending: Orthopedic Surgery | Admitting: Orthopedic Surgery

## 2021-01-06 DIAGNOSIS — M25552 Pain in left hip: Secondary | ICD-10-CM

## 2021-01-09 ENCOUNTER — Encounter (HOSPITAL_BASED_OUTPATIENT_CLINIC_OR_DEPARTMENT_OTHER): Payer: Self-pay | Admitting: General Practice

## 2021-01-09 NOTE — Progress Notes (Signed)
Office Visit    Patient Name: [name] Date of Encounter: 01/10/2021  Primary Care Provider:  Vicenta Aly, [place] Primary Cardiologist:  Kirk Ruths, MD  Chief Complaint    50 year old female with a history of CAD s/p NSTEMI, DES-mLAD, chronic diastolic heart failure, hypertension, hyperlipidemia, type 2 diabetes, hypothyroidism, depression, morbid obesity, chronic respiratory failure on home oxygen, and GERD who present for follow-up related to CAD.   Past Medical History    Past Medical History:  Diagnosis Date   CAD (coronary artery disease)    a. s/p NSTEMI, LHC 03/2020 dRCA 25%, m-LAD-1 95%-0% (DES), mLAD-2 50%, LVEDP moderately elevated, 27 mmHg.   Chronic diastolic heart failure ([place])    a. LHC/echo 3/22 c/ elevated LVEDP, EF 50-55%, hypokinesis; b. Echo 10/22 EF 60-65%, mild LVH, borderline dilatation of ascending aorta, 22mm.   DDD (degenerative disc disease), lumbar    Diabetes mellitus without complication ([place])    GERD (gastroesophageal reflux disease)    Hyperlipidemia    Hypertension    Hypothyroidism    Lumbar herniated disc    Major depressive disorder    Morbid obesity ([place])    Past Surgical History:  Procedure Laterality Date   CORONARY STENT INTERVENTION N/A 03/28/2020   Procedure: CORONARY STENT INTERVENTION;  Surgeon: Jettie Booze, MD;  Location: [place] [facility];  Service: Cardiovascular;  Laterality: N/A;   DILATION AND CURETTAGE OF UTERUS     INCISION AND DRAINAGE PERIRECTAL ABSCESS  06/21/2014   INCISION AND DRAINAGE PERIRECTAL ABSCESS Bilateral 06/21/2014   Procedure: IRRIGATION AND DEBRIDEMENT PERIRECTAL ABSCESS;  Surgeon: Coralie Keens, MD;  Location: [facility];  Service: General;  Laterality: Bilateral;   LEFT HEART CATH AND CORONARY ANGIOGRAPHY N/A 03/28/2020   Procedure: LEFT HEART CATH AND CORONARY ANGIOGRAPHY;  Surgeon: Jettie Booze, MD;  Location: [place] [facility];  Service: Cardiovascular;  Laterality: N/A;     Allergies  Allergies  Allergen Reactions   Clindamycin/Lincomycin Other (See Comments) and Nausea And Vomiting    Stomach pain    Latex Rash   Ultram [Tramadol] Rash    History of Present Illness    50 year old female with the above past medical history including CAD s/p NSTEMI, DES-mLAD, chronic diastolic heart failure, hypertension, hyperlipidemia, type 2 diabetes, hypothyroidism, depression, morbid obesity, chronic respiratory failure on home oxygen, and GERD.  She was admitted to the hospital in 03/2020 with chest pain, NSTEMI, s/p DES-mLAD, on DAPT with ASA and Brilinta, switched to ASA and Plavix. She was last seen in the office on 10/18/2020 and reported increased fatigue and dyspnea on exertion in the setting of morbid obesity, deconditioning and chronic respiratory failure requiring oxygen therapy. Weight loss was advised and a sleep study was ordered. Repeat echo 10/2020 showed EF 60-65% improved from 50-55%.   She comes in today for follow-up with myriad of concerns, which she states she recently discussed with her PCP. Since her last visit she reports ongoing dyspnea on exertion and occasionally at rest. She states she has recently increased her oxygen to 2.5 L continuous use. She states that her oxygen saturation drops  into the 80s with ambulation if she is not wearing her oxygen. She does report persistent lower extremity edema, however, her weight has been stable. She also complains of a vague squeezing sensation in her right upper chest, tender to touch, that improves with repositioning. This is not similar to her prior anginal equivalent. Additionally, she has intermittent left shooting jaw pain  that lasts for days at a time and resolves spontaneously. She states it is similar to the pain she experienced when she had shingles. She is scheduled for sleep study. Her activity remains limited in the setting of chronic back pain and peripheral neuropathy.  She is scheduled to see a  dietitian in the near future.  Home Medications    Current Outpatient Medications  Medication Sig Dispense Refill   acetaminophen (TYLENOL) 500 MG tablet Take 1,000 mg by mouth every 6 (six) hours as needed for moderate pain or headache.     aspirin 81 MG chewable tablet Chew 1 tablet (81 mg total) by mouth daily.     clopidogrel (PLAVIX) 75 MG tablet Take 1 tablet by mouth once daily 90 tablet 1   dapagliflozin propanediol (FARXIGA) 10 MG TABS tablet Take 1 tablet (10 mg total) by mouth daily before breakfast. 90 tablet 3   DULoxetine (CYMBALTA) 30 MG capsule Take 60 mg by mouth at bedtime.     furosemide (LASIX) 80 MG tablet Take 1 tablet (80 mg total) by mouth daily. 30 tablet 5   gabapentin (NEURONTIN) 300 MG capsule Take 600 mg by mouth in the morning, at noon, and at bedtime.     levothyroxine (SYNTHROID, LEVOTHROID) 50 MCG tablet Take 50 mcg by mouth daily.  3   meclizine (ANTIVERT) 25 MG tablet Take 1-2 tablets (25-50 mg total) by mouth 3 (three) times daily as needed for dizziness. 30 tablet 0   metFORMIN (GLUCOPHAGE-XR) 500 MG 24 hr tablet Take 4 tablets (2,000 mg total) by mouth daily. 360 tablet 3   metoprolol succinate (TOPROL XL) 50 MG 24 hr tablet Take 1 tablet (50 mg total) by mouth daily. Take with or immediately following a meal. MAY TAKE EXTRA $RemoveBe'25MG'YaObWqtAu$  FOR PALPITATIONS 45 tablet 11   NOVOLIN 70/30 RELION (70-30) 100 UNIT/ML injection Inject 125-135 Units into the skin See admin instructions. Use 125 units every morning and use 135 units every evening     nystatin (MYCOSTATIN/NYSTOP) powder Apply 1 application topically in the morning, at noon, in the evening, and at bedtime.     nystatin cream (MYCOSTATIN) Apply 1 application topically in the morning, at noon, in the evening, and at bedtime. Abdominal folds     omeprazole (PRILOSEC) 40 MG capsule Take 40 mg by mouth 2 (two) times daily.     ondansetron (ZOFRAN ODT) 8 MG disintegrating tablet Take 1 tablet (8 mg total) by mouth  every 8 (eight) hours as needed for nausea or vomiting. 20 tablet 0   potassium chloride (KLOR-CON) 10 MEQ tablet Take 1 tablet by mouth once daily 30 tablet 5   rosuvastatin (CRESTOR) 20 MG tablet Take 1 tablet (20 mg total) by mouth every Monday, Wednesday, and Friday. Increase to daily as able to tolerate 39 tablet 0   spironolactone (ALDACTONE) 25 MG tablet Take 1 tablet by mouth once daily 30 tablet 5   No current facility-administered medications for this visit.     Review of Systems    She denies chest pain, palpitations, pnd, n, v, dizziness, syncope, weight gain, or early satiety. All other systems reviewed and are otherwise negative except as noted above.   Physical Exam    VS:  BP (!) 118/52 (BP Location: Left Arm, Patient Position: Sitting, Cuff Size: Large)    Pulse 74    Ht $R'5\' 6"'tc$  (1.676 m)    Wt (!) 365 lb (165.6 kg)    SpO2 95%    BMI  58.91 kg/m   GEN: Well nourished, well developed, in no acute distress. HEENT: normal. Neck: Supple, no JVD, carotid bruits, or masses. Cardiac: RRR, no murmurs, rubs, or gallops. No clubbing, cyanosis. Bilateral non pitting lower extremity edema. Radials/DP/PT 2+ and equal bilaterally.  Respiratory:  Respirations regular and unlabored, clear to auscultation bilaterally. GI: Obese, soft, nontender, nondistended, BS + x 4. MS: no deformity or atrophy. Skin: warm and dry, no rash. Neuro:  Strength and sensation are intact. Psych: Normal affect.  Accessory Clinical Findings    ECG personally reviewed by me today - No EKG in office today.   Lab Results  Component Value Date   WBC 10.8 (H) 04/21/2020   HGB 12.9 04/21/2020   HCT 42.0 04/21/2020   MCV 79.2 (L) 04/21/2020   PLT 245 04/21/2020   Lab Results  Component Value Date   CREATININE 1.10 (H) 06/06/2020   BUN 24 06/06/2020   NA 139 06/06/2020   K 4.4 06/06/2020   CL 96 06/06/2020   CO2 26 06/06/2020   Lab Results  Component Value Date   ALT 24 04/21/2020   AST 22  04/21/2020   ALKPHOS 87 04/21/2020   BILITOT 0.8 04/21/2020   Lab Results  Component Value Date   CHOL 106 03/28/2020   HDL 27 (L) 03/28/2020   LDLCALC 57 03/28/2020   TRIG 110 03/28/2020   CHOLHDL 3.9 03/28/2020    Lab Results  Component Value Date   HGBA1C 9.7 (A) 01/01/2021    Assessment & Plan    1. CAD: NSTEMI, s/p DES-mLAD, on DAPT with ASA and Brilinta, switched to ASA/Plavix. Stable with no anginal symptoms. Chronic dyspnea and fatigue associated with mild chest pressure in the setting of morbid obesity, deconditioning and chronic respiratory failure requiring oxygen therapy. Repeat echo 10/2020 showed EF 60-65% improved from 50-55%. She reports right upper chest discomfort squeezing sensation that occurs at rest and with certain arm movements, tender to the touch.  She states this is not similar to her prior anginal equivalent. Likely musculoskeletal in nature. She does have bilateral lower extremity edema on exam today and appears to be mildly volume up.  Additional diuresis as below. If her symptoms do not improve with additional diuresis, consider possible ischemic evaluation with Lexiscan Myoview at follow-up.  We will also refer her to pulmonology given chronic dyspnea and chronic home oxygen use. Encouraged weight loss, referral sent to [facility]. Sleep study pending. Continue ASA, Plavix, Crestor, Toprol-XL, Lasix, spironolactone, and Farxiga.  2. Chronic diastolic heart failure: Recent echo as above. Her weight has been stable. However, with ongoing chronic dyspnea and bilateral lower extremity edema, I will have her take an additional 80 mg of Lasix daily for 3 days, for a total of 160 mg daily, followed by Lasix  80 mg daily. Check BNP, BMET today, with repeat BMET in 1 week.  I also advised her to take an additional potassium pill for total of 20 mill equivalents daily for 3 days with increased Lasix dosing.  Otherwise, continue GDMT as above.   3. Chronic dyspnea/chronic  respiratory failure with home oxygen use/suspected OSA: Continues to require home O2, now on 2.5L continuous. Sleep study pending.  Diuresis as above. Pulmonology referral as above.   4. Hypertension: BP well controlled. Continue current antihypertensive regimen.   5. Hyperlipidemia LDL goal <70: LDL 57 on 03/28/20. Repeat lipids due 3/23. Continue Crestor 20 mg daily.    6. Type 2 Diabetes/Morbid obesity: A1C 9.7 on 01/01/21. Managed  per PCP/endocrinology. Weight loss encouraged, though physical activity is limited due to low back pain/peripheral neuropathy.  Will refer to healthy weight and wellness.  She also has an appointment with the dietitian coming up.   7. Disposition: Repeat BMET in 1 week, follow-up in clinic in 2 weeks.    Lenna Sciara, NP 01/10/2021, 4:43 PM

## 2021-01-10 ENCOUNTER — Encounter (HOSPITAL_BASED_OUTPATIENT_CLINIC_OR_DEPARTMENT_OTHER): Payer: Self-pay | Admitting: General Practice

## 2021-01-10 ENCOUNTER — Ambulatory Visit (INDEPENDENT_AMBULATORY_CARE_PROVIDER_SITE_OTHER): Payer: 59 | Admitting: Nurse Practitioner

## 2021-01-10 ENCOUNTER — Other Ambulatory Visit: Payer: Self-pay

## 2021-01-10 VITALS — BP 118/52 | HR 74 | Ht 66.0 in | Wt 365.0 lb

## 2021-01-10 DIAGNOSIS — R0609 Other forms of dyspnea: Secondary | ICD-10-CM | POA: Diagnosis not present

## 2021-01-10 DIAGNOSIS — I2510 Atherosclerotic heart disease of native coronary artery without angina pectoris: Secondary | ICD-10-CM | POA: Diagnosis not present

## 2021-01-10 DIAGNOSIS — R601 Generalized edema: Secondary | ICD-10-CM

## 2021-01-10 DIAGNOSIS — E785 Hyperlipidemia, unspecified: Secondary | ICD-10-CM

## 2021-01-10 DIAGNOSIS — E118 Type 2 diabetes mellitus with unspecified complications: Secondary | ICD-10-CM

## 2021-01-10 DIAGNOSIS — Z9981 Dependence on supplemental oxygen: Secondary | ICD-10-CM

## 2021-01-10 DIAGNOSIS — Z6841 Body Mass Index (BMI) 40.0 and over, adult: Secondary | ICD-10-CM

## 2021-01-10 DIAGNOSIS — I5032 Chronic diastolic (congestive) heart failure: Secondary | ICD-10-CM | POA: Diagnosis not present

## 2021-01-10 DIAGNOSIS — I10 Essential (primary) hypertension: Secondary | ICD-10-CM

## 2021-01-10 NOTE — Patient Instructions (Addendum)
Medication Instructions:  Your physician has recommended you make the following change in your medication:   Change: Please take 160mg  of lasix daily for 3 days      Please take of Potassium daily for 3 days    After 3 days please return to your normal dosing! Please send a mychart message on Monday with a weight log from the 3 day treatment plan.   *If you need a refill on your cardiac medications before your next appointment, please call your pharmacy*   Lab Work: Your physician recommends that you return for lab work today-BNP,BMET (3rd florr)   In one week please return for repeat BNP!    If you have labs (blood work) drawn today and your tests are completely normal, you will receive your results only by: MyChart Message (if you have MyChart) OR A paper copy in the mail If you have any lab test that is abnormal or we need to change your treatment, we will call you to review the results.   Testing/Procedures: None ordered today    Follow-Up: At [facility], you and your health needs are our priority.  As part of our continuing mission to provide you with exceptional heart care, we have created designated Provider Care Teams.  These Care Teams include your primary Cardiologist (physician) and Advanced Practice Providers (APPs -  Physician Assistants and Nurse Practitioners) who all work together to provide you with the care you need, when you need it.  We recommend signing up for the patient portal called "MyChart".  Sign up information is provided on this After Visit Summary.  MyChart is used to connect with patients for Virtual Visits (Telemedicine).  Patients are able to view lab/test results, encounter notes, upcoming appointments, etc.  Non-urgent messages can be sent to your provider as well.   To learn more about what you can do with MyChart, go to [facility].    Your next appointment:   2 week(s)  The format for your next appointment:   In  Person  Provider:   [email or web address], NP or Gillian Shields, NP    Other Instructions     American Heart Association Recommendations for Physical Activity in Adults  Are you fitting in at least 150 minutes (2.5 hours) of heart-pumping physical activity per week? If not, youre not alone. Only about one in five adults and teens get enough exercise to maintain good health. Being more active can help all people think, feel and sleep better and perform daily tasks more easily. And if youre sedentary, sitting less is a great place to start.  These recommendations are based on the Physical Activity Guidelines for Americans, 2nd edition, published by the U.S. Department of Health and Edd Fabian, Office of Disease Prevention and Health Promotion. They recommend how much physical activity we need to be healthy. The guidelines are based on current scientific evidence supporting the connections between physical activity, overall health and well-being, disease prevention and quality of life.  AHA Physical Activity Recommendations for Adults     Get at least 150 minutes per week of moderate-intensity aerobic activity or 75 minutes per week of vigorous aerobic activity, or a combination of both, preferably spread throughout the week.  Add moderate- to high-intensity muscle-strengthening activity (such as resistance or weights) on at least 2 days per week. Spend less time sitting. Even light-intensity activity can offset some of the risks of being sedentary.  Gain even more benefits by being active at least 300  minutes (5 hours) per week. Increase amount and intensity gradually over time.   What is intensity?  Physical activity is anything that moves your body and burns calories. This includes things like walking, climbing stairs and stretching.  Aerobic (or cardio) activity gets your heart rate up and benefits your heart by improving cardiorespiratory fitness. When done at moderate intensity,  your heart will beat faster and youll breathe harder than normal, but youll still be able to talk. Think of it as a medium or moderate amount of effort.  Examples of moderate-intensity aerobic activities:  brisk walking (at least 2.5 miles per hour) water aerobics dancing (ballroom or social) gardening tennis (doubles) biking slower than 10 miles per hour Vigorous intensity activities will push your body a little further. They will require a higher amount of effort. Youll probably get warm and begin to sweat. You wont be able to talk much without getting out of breath.  Examples of vigorous-intensity aerobic activities:  hiking uphill or with a heavy backpack running swimming laps aerobic dancing heavy yardwork like continuous digging or hoeing tennis (singles) cycling 10 miles per hour or faster jumping rope Knowing your target heart rate can also help you track the intensity of your activities.  For maximum benefits, include both moderate- and vigorous-intensity activity in your routine along with strengthening and stretching exercises.  What if Im just starting to get active?  Dont worry if you cant reach 150 minutes per week just yet. Everyone has to start somewhere. Even if you've been sedentary for years, today is the day you can begin to make healthy changes in your life. Set a reachable goal for today. You can work up toward the recommended amount by increasing your time as you get stronger. Don't let all-or-nothing thinking keep you from doing what you can every day.  The simplest way to get moving and improve your health is to start walking. It's free, easy and can be done just about anywhere, even in place.  Any amount of movement is better than none. And you can break it up into short bouts of activity throughout the day. Taking a brisk walk for five or ten minutes a few times a day will add up.  If you have a chronic condition or disability, talk with your  healthcare provider about what types and amounts of physical activity are right for you before making too many changes. But dont wait! Get started today by simply sitting less and moving more, whatever that looks like for you.  The takeaway:  Move more, with more intensity, and sit less. Science has linked being inactive and sitting too much with higher risk of heart disease, type 2 diabetes, colon and lung cancers, and early death.  Its clear that being more active benefits everyone and helps [place] live longer, healthier lives.  Here are some of the big wins:  Lower risk of heart disease, stroke, type 2 diabetes, high blood pressure, dementia and Alzheimers, several types of cancer, and some complications of pregnancy\  Better sleep, including improvements in insomnia and obstructive sleep apnea  Improved cognition, including memory, attention and processing speed  Less weight gain, obesity and related chronic health conditions  Better bone health and balance, with less risk of injury from falls  Fewer symptoms of depression and anxiety  Better quality of life and sense of overall well-being

## 2021-01-11 ENCOUNTER — Encounter (HOSPITAL_BASED_OUTPATIENT_CLINIC_OR_DEPARTMENT_OTHER): Payer: Self-pay

## 2021-01-11 LAB — BASIC METABOLIC PANEL
BUN/Creatinine Ratio: 20 (ref 9–23)
BUN: 19 mg/dL (ref 6–24)
CO2: 25 mmol/L (ref 20–29)
Calcium: 8.8 mg/dL (ref 8.7–10.2)
Chloride: 103 mmol/L (ref 96–106)
Creatinine, Ser: 0.96 mg/dL (ref 0.57–1.00)
Glucose: 50 mg/dL — ABNORMAL LOW (ref 70–99)
Potassium: 3.7 mmol/L (ref 3.5–5.2)
Sodium: 144 mmol/L (ref 134–144)
eGFR: 73 mL/min/{1.73_m2} (ref >59)

## 2021-01-11 LAB — BRAIN NATRIURETIC PEPTIDE: BNP: 25.4 pg/mL (ref 0.0–100.0)

## 2021-01-11 NOTE — Addendum Note (Signed)
Addended by: Marlene Lard on: 01/11/2021 04:33 PM   Modules accepted: Orders

## 2021-01-11 NOTE — Progress Notes (Signed)
Called pt. To discuss lab results, no answer left voicemail with lab results and need for labs in 1 week, ok per DPR. Will mail lab slips to pt.

## 2021-01-17 ENCOUNTER — Telehealth (HOSPITAL_BASED_OUTPATIENT_CLINIC_OR_DEPARTMENT_OTHER): Payer: Self-pay | Admitting: Nurse Practitioner

## 2021-01-17 NOTE — Telephone Encounter (Signed)
Left message for patient regarding the Monday 02/05/21 9:30 am pulmonary appointment with Dr. Celine Mans-- arrival time is 9:15 am for check in [address]--phone [phone].  Will mail information to patient and requested she call with questions or concerns.

## 2021-02-01 ENCOUNTER — Telehealth: Payer: Self-pay | Admitting: General Practice

## 2021-02-01 ENCOUNTER — Other Ambulatory Visit (HOSPITAL_COMMUNITY): Payer: Self-pay

## 2021-02-01 ENCOUNTER — Other Ambulatory Visit: Payer: Self-pay

## 2021-02-01 MED ORDER — ROSUVASTATIN CALCIUM 20 MG PO TABS
20.0000 mg | ORAL_TABLET | ORAL | 0 refills | Status: DC
  Filled 2021-02-01: qty 39, 90d supply, fill #0

## 2021-02-01 NOTE — Telephone Encounter (Signed)
°*  STAT* If patient is at the pharmacy, call can be transferred to refill team.   1. Which medications need to be refilled? (please list name of each medication and dose if known) new prescription for Rosuvastatin, the dose was changed to every day  2. Which pharmacy/location (including street and city if local pharmacy) is medication to be sent to? [facility] [address] [place]  3. Do they need a 30 day or 90 day supply? 90 days and refills

## 2021-02-02 ENCOUNTER — Ambulatory Visit: Payer: 59 | Admitting: Endocrinology

## 2021-02-04 ENCOUNTER — Ambulatory Visit (HOSPITAL_BASED_OUTPATIENT_CLINIC_OR_DEPARTMENT_OTHER): Payer: Medicaid Other | Attending: General Practice | Admitting: Cardiovascular Disease

## 2021-02-05 ENCOUNTER — Institutional Professional Consult (permissible substitution): Payer: 59 | Admitting: Internal Medicine

## 2021-02-05 NOTE — Progress Notes (Deleted)
Office Visit    Patient Name: [name] Date of Encounter: 02/07/2021  Primary Care Provider:  Vicenta Aly, [place] Primary Cardiologist:  Kirk Ruths, MD  Chief Complaint    51 year old female with a history of CAD s/p NSTEMI, DES-mLAD (03/2020), chronic diastolic heart failure, hypertension, hyperlipidemia, type 2 diabetes, hypothyroidism, depression, morbid obesity, chronic respiratory failure on home oxygen, and GERD who present for follow-up related to CAD.   Past Medical History    Past Medical History:  Diagnosis Date   CAD (coronary artery disease)    a. s/p NSTEMI, LHC 03/2020 dRCA 25%, m-LAD-1 95%-0% (DES), mLAD-2 50%, LVEDP moderately elevated, 27 mmHg.   Chronic diastolic heart failure ([place])    a. LHC/echo 3/22 c/ elevated LVEDP, EF 50-55%, hypokinesis; b. Echo 10/22 EF 60-65%, mild LVH, borderline dilatation of ascending aorta, 33mm.   DDD (degenerative disc disease), lumbar    Diabetes mellitus without complication ([place])    GERD (gastroesophageal reflux disease)    Hyperlipidemia    Hypertension    Hypothyroidism    Lumbar herniated disc    Major depressive disorder    Morbid obesity ([place])    Past Surgical History:  Procedure Laterality Date   CORONARY STENT INTERVENTION N/A 03/28/2020   Procedure: CORONARY STENT INTERVENTION;  Surgeon: Jettie Booze, MD;  Location: [facility];  Service: Cardiovascular;  Laterality: N/A;   DILATION AND CURETTAGE OF UTERUS     INCISION AND DRAINAGE PERIRECTAL ABSCESS  06/21/2014   INCISION AND DRAINAGE PERIRECTAL ABSCESS Bilateral 06/21/2014   Procedure: IRRIGATION AND DEBRIDEMENT PERIRECTAL ABSCESS;  Surgeon: Coralie Keens, MD;  Location: [facility];  Service: General;  Laterality: Bilateral;   LEFT HEART CATH AND CORONARY ANGIOGRAPHY N/A 03/28/2020   Procedure: LEFT HEART CATH AND CORONARY ANGIOGRAPHY;  Surgeon: Jettie Booze, MD;  Location: [facility];  Service: Cardiovascular;  Laterality:  N/A;    Allergies  Allergies  Allergen Reactions   Clindamycin/Lincomycin Other (See Comments) and Nausea And Vomiting    Stomach pain    Latex Rash   Ultram [Tramadol] Rash    History of Present Illness   51 year old female with the above past medical history including CAD s/p NSTEMI, DES-mLAD (03/2020), chronic diastolic heart failure, hypertension, hyperlipidemia, type 2 diabetes, hypothyroidism, depression, morbid obesity, chronic respiratory failure on home oxygen, and GERD.  She was admitted to the hospital in 03/2020 with chest pain, NSTEMI, s/p DES-mLAD, on DAPT with ASA and Brilinta, switched to ASA and Plavix. She was seen in the office on 10/18/2020 and reported increased fatigue and dyspnea on exertion in the setting of morbid obesity, deconditioning and chronic respiratory failure requiring oxygen therapy. Weight loss was advised and a sleep study was ordered. Repeat echo 10/2020 showed EF 60-65% improved from 50-55%.    She was last seen in the office on 01/10/2021 and reported a myriad of concerns including ongoing dyspnea on exertion and occasionally at rest, fatigue, persistent lower extremity edema, and atypical chest pain.  Her activity is severely limited at baseline in the setting of chronic respiratory failure, morbid obesity, chronic back pain and severe peripheral neuropathy. She did show signs of mild fluid volume overload, with worsening BLE edema and increased dyspnea. She was diuresed with additional p.o. Lasix with plans for close follow-up. She was also referred to pulmonology in the setting of ongoing dyspnea and home oxygen use with increased oxygen requirements was referred to healthy weight and wellness for assistance with  weight loss management.  She presents today for follow-up.  Since her last visit  1. CAD/atypical chest pain: S/p NSTEMI, DES-mLAD in 03/2020, on DAPT with ASA/Plavix. Recently reports atypical chest discomfort which she describes as a  squeezing sensation not similar to her prior anginal equivalent.  Consider Lexiscan Myoview.  2. Chronic diastolic heart failure: Recent echo showed EF 60-65% improved from 50-55%.  Thought to be slightly volume up at last visit, diuresed with additional Lasix for 3 days.  Ongoing chronic dyspnea on exertion with chronic respiratory failure on home O2.  Recently referred to pulmonology.   3. Chronic dyspnea/chronic respiratory failure with home oxygen use/suspected OSA: Continues to require home O2, now on 2.5L continuous. Sleep study pending.  Pulmonology referral pending.   4. Hypertension: BP well controlled. Continue current antihypertensive regimen.    5. Hyperlipidemia LDL goal <70: LDL 57 on 03/28/20. Repeat lipids due 3/23, will order today.  Continue Crestor 20 mg daily.     6. Type 2 Diabetes/Morbid obesity: A1C 9.7 on 01/01/21. Managed per PCP/endocrinology.  Recently referred to [facility].  Meeting with dietitian.  Weight loss encouraged, though physical activity is limited due to low back pain/peripheral neuropathy.   7. Disposition:    Home Medications    Current Outpatient Medications  Medication Sig Dispense Refill   acetaminophen (TYLENOL) 500 MG tablet Take 1,000 mg by mouth every 6 (six) hours as needed for moderate pain or headache.     aspirin 81 MG chewable tablet Chew 1 tablet (81 mg total) by mouth daily.     clopidogrel (PLAVIX) 75 MG tablet Take 1 tablet by mouth once daily 90 tablet 1   dapagliflozin propanediol (FARXIGA) 10 MG TABS tablet Take 1 tablet (10 mg total) by mouth daily before breakfast. 90 tablet 3   DULoxetine (CYMBALTA) 30 MG capsule Take 60 mg by mouth at bedtime.     furosemide (LASIX) 80 MG tablet Take 1 tablet (80 mg total) by mouth daily. 30 tablet 5   gabapentin (NEURONTIN) 300 MG capsule Take 600 mg by mouth in the morning, at noon, and at bedtime.     levothyroxine (SYNTHROID, LEVOTHROID) 50 MCG tablet Take 50 mcg by mouth daily.  3   meclizine  (ANTIVERT) 25 MG tablet Take 1-2 tablets (25-50 mg total) by mouth 3 (three) times daily as needed for dizziness. 30 tablet 0   metFORMIN (GLUCOPHAGE-XR) 500 MG 24 hr tablet Take 4 tablets (2,000 mg total) by mouth daily. 360 tablet 3   metoprolol succinate (TOPROL XL) 50 MG 24 hr tablet Take 1 tablet (50 mg total) by mouth daily. Take with or immediately following a meal. MAY TAKE EXTRA $RemoveBe'25MG'AwbrBkmSf$  FOR PALPITATIONS 45 tablet 11   NOVOLIN 70/30 RELION (70-30) 100 UNIT/ML injection Inject 125-135 Units into the skin See admin instructions. Use 125 units every morning and use 135 units every evening     nystatin (MYCOSTATIN/NYSTOP) powder Apply 1 application topically in the morning, at noon, in the evening, and at bedtime.     nystatin cream (MYCOSTATIN) Apply 1 application topically in the morning, at noon, in the evening, and at bedtime. Abdominal folds     omeprazole (PRILOSEC) 40 MG capsule Take 40 mg by mouth 2 (two) times daily.     ondansetron (ZOFRAN ODT) 8 MG disintegrating tablet Take 1 tablet (8 mg total) by mouth every 8 (eight) hours as needed for nausea or vomiting. 20 tablet 0   potassium chloride (KLOR-CON) 10 MEQ tablet Take  1 tablet by mouth once daily 30 tablet 5   rosuvastatin (CRESTOR) 20 MG tablet Take 1 tablet (20 mg total) by mouth every Monday, Wednesday, and Friday. Increase to daily as able to tolerate 39 tablet 0   spironolactone (ALDACTONE) 25 MG tablet Take 1 tablet by mouth once daily 30 tablet 5   No current facility-administered medications for this visit.     Review of Systems    ***.  All other systems reviewed and are otherwise negative except as noted above.    Physical Exam    VS:  There were no vitals taken for this visit. , BMI There is no height or weight on file to calculate BMI.     GEN: Well nourished, well developed, in no acute distress. HEENT: normal. Neck: Supple, no JVD, carotid bruits, or masses. Cardiac: RRR, no murmurs, rubs, or gallops. No  clubbing, cyanosis, edema.  Radials/DP/PT 2+ and equal bilaterally.  Respiratory:  Respirations regular and unlabored, clear to auscultation bilaterally. GI: Soft, nontender, nondistended, BS + x 4. MS: no deformity or atrophy. Skin: warm and dry, no rash. Neuro:  Strength and sensation are intact. Psych: Normal affect.  Accessory Clinical Findings    ECG personally reviewed by me today - *** - no acute changes.  Lab Results  Component Value Date   WBC 10.8 (H) 04/21/2020   HGB 12.9 04/21/2020   HCT 42.0 04/21/2020   MCV 79.2 (L) 04/21/2020   PLT 245 04/21/2020   Lab Results  Component Value Date   CREATININE 0.96 01/10/2021   BUN 19 01/10/2021   NA 144 01/10/2021   K 3.7 01/10/2021   CL 103 01/10/2021   CO2 25 01/10/2021   Lab Results  Component Value Date   ALT 24 04/21/2020   AST 22 04/21/2020   ALKPHOS 87 04/21/2020   BILITOT 0.8 04/21/2020   Lab Results  Component Value Date   CHOL 106 03/28/2020   HDL 27 (L) 03/28/2020   LDLCALC 57 03/28/2020   TRIG 110 03/28/2020   CHOLHDL 3.9 03/28/2020    Lab Results  Component Value Date   HGBA1C 9.7 (A) 01/01/2021    Assessment & Plan    1.  ***   Lenna Sciara, NP 02/07/2021, 7:27 AM

## 2021-02-07 ENCOUNTER — Ambulatory Visit (HOSPITAL_BASED_OUTPATIENT_CLINIC_OR_DEPARTMENT_OTHER): Payer: 59 | Admitting: General Practice

## 2021-02-13 ENCOUNTER — Telehealth (HOSPITAL_BASED_OUTPATIENT_CLINIC_OR_DEPARTMENT_OTHER): Payer: Self-pay | Admitting: Nurse Practitioner

## 2021-02-13 NOTE — Telephone Encounter (Signed)
Left message for patient to call and discuss rescheduling the pulmonary consult ordered by Diona Browner, NP

## 2021-02-16 DIAGNOSIS — F329 Major depressive disorder, single episode, unspecified: Secondary | ICD-10-CM | POA: Diagnosis not present

## 2021-02-16 DIAGNOSIS — E782 Mixed hyperlipidemia: Secondary | ICD-10-CM | POA: Diagnosis not present

## 2021-02-16 DIAGNOSIS — E039 Hypothyroidism, unspecified: Secondary | ICD-10-CM | POA: Diagnosis not present

## 2021-02-16 DIAGNOSIS — Z794 Long term (current) use of insulin: Secondary | ICD-10-CM | POA: Diagnosis not present

## 2021-02-16 DIAGNOSIS — E1169 Type 2 diabetes mellitus with other specified complication: Secondary | ICD-10-CM | POA: Diagnosis not present

## 2021-02-16 DIAGNOSIS — I10 Essential (primary) hypertension: Secondary | ICD-10-CM | POA: Diagnosis not present

## 2021-02-16 DIAGNOSIS — I5032 Chronic diastolic (congestive) heart failure: Secondary | ICD-10-CM | POA: Diagnosis not present

## 2021-02-16 DIAGNOSIS — I214 Non-ST elevation (NSTEMI) myocardial infarction: Secondary | ICD-10-CM | POA: Diagnosis not present

## 2021-02-16 DIAGNOSIS — Z0000 Encounter for general adult medical examination without abnormal findings: Secondary | ICD-10-CM | POA: Diagnosis not present

## 2021-02-27 ENCOUNTER — Ambulatory Visit (INDEPENDENT_AMBULATORY_CARE_PROVIDER_SITE_OTHER): Payer: Medicaid Other | Admitting: Endocrinology

## 2021-02-27 ENCOUNTER — Other Ambulatory Visit: Payer: Self-pay

## 2021-02-27 ENCOUNTER — Encounter: Payer: Self-pay | Admitting: Endocrinology

## 2021-02-27 VITALS — BP 142/74 | HR 69 | Ht 66.0 in

## 2021-02-27 DIAGNOSIS — Z794 Long term (current) use of insulin: Secondary | ICD-10-CM

## 2021-02-27 DIAGNOSIS — E1165 Type 2 diabetes mellitus with hyperglycemia: Secondary | ICD-10-CM

## 2021-02-27 LAB — POCT GLYCOSYLATED HEMOGLOBIN (HGB A1C): Hemoglobin A1C: 9.2 % — AB (ref 4.0–5.6)

## 2021-02-27 MED ORDER — NOVOLIN 70/30 RELION (70-30) 100 UNIT/ML ~~LOC~~ SUSP: SUBCUTANEOUS | 3 refills | Status: DC

## 2021-02-27 NOTE — Patient Instructions (Addendum)
check your blood sugar twice a day.  vary the time of day when you check, between before the 3 meals, and at bedtime.  also check if you have symptoms of your blood sugar being too high or too low.  please keep a record of the readings and bring it to your next appointment here (or you can bring the meter itself).  You can write it on any piece of paper.  please call us sooner if your blood sugar goes below 70, or if most of your readings are over 200.   We will need to take this complex situation in stages.   I have sent 2 prescriptions to your pharmacy: to change the insulin to 155 units with breakfast, and 125 units with supper.  Please continue the same other 2 diabetes medications.   Please come back for a follow-up appointment in 2 months.

## 2021-02-27 NOTE — Progress Notes (Signed)
Subjective:    Patient ID: [name], female    DOB: 06/13/1970, 51 y.o.   MRN: [id number]  HPI Pt returns for f/u of diabetes mellitus:  DM type: Insulin-requiring type 2 Dx'ed: 1999 Complications: CAD, Charcot foot, DR, and PN Therapy: insulin since 2008, and 2 oral meds.   GDM: never DKA: never Severe hypoglycemia: never Pancreatitis: never Pancreatic imaging: normal on 2020 CT SDOH: she cannot afford brand name meds.   Other: she takes BID premixed insulin, at least for now.  Interval history: no cbg record, but states cbg's vary from 90-274.  It is in general higher as the day goes on.  She takes meds as rx'ed.  She takes 125 units qam and 135 units qpm.   Past Medical History:  Diagnosis Date   CAD (coronary artery disease)    a. s/p NSTEMI, LHC 03/2020 dRCA 25%, m-LAD-1 95%-0% (DES), mLAD-2 50%, LVEDP moderately elevated, 27 mmHg.   Chronic diastolic heart failure (HCC)    a. LHC/echo 3/22 c/ elevated LVEDP, EF 50-55%, hypokinesis; b. Echo 10/22 EF 60-65%, mild LVH, borderline dilatation of ascending aorta, 39mm.   DDD (degenerative disc disease), lumbar    Diabetes mellitus without complication (HCC)    GERD (gastroesophageal reflux disease)    Hyperlipidemia    Hypertension    Hypothyroidism    Lumbar herniated disc    Major depressive disorder    Morbid obesity (HCC)     Past Surgical History:  Procedure Laterality Date   CORONARY STENT INTERVENTION N/A 03/28/2020   Procedure: CORONARY STENT INTERVENTION;  Surgeon: Corky CraftsVaranasi, Jayadeep S, MD;  Location: [facility];  Service: Cardiovascular;  Laterality: N/A;   DILATION AND CURETTAGE OF UTERUS     INCISION AND DRAINAGE PERIRECTAL ABSCESS  06/21/2014   INCISION AND DRAINAGE PERIRECTAL ABSCESS Bilateral 06/21/2014   Procedure: IRRIGATION AND DEBRIDEMENT PERIRECTAL ABSCESS;  Surgeon: Abigail Miyamotoouglas Blackman, MD;  Location: [facility];  Service: General;  Laterality: Bilateral;   LEFT HEART CATH AND CORONARY ANGIOGRAPHY  N/A 03/28/2020   Procedure: LEFT HEART CATH AND CORONARY ANGIOGRAPHY;  Surgeon: Corky CraftsVaranasi, Jayadeep S, MD;  [facility];  Service: Cardiovascular;  Laterality: N/A;    Social History   Socioeconomic History   Marital status: Single    Spouse name: Not on file   Number of children: Not on file   Years of education: Not on file   Highest education level: Not on file  Occupational History   Not on file  Tobacco Use   Smoking status: Never   Smokeless tobacco: Never  Vaping Use   Vaping Use: Never used  Substance and Sexual Activity   Alcohol use: No   Drug use: No   Sexual activity: Not on file  Other Topics Concern   Not on file  Social History Narrative   Not on file   Social Determinants of Health   Financial Resource Strain: Medium Risk   Difficulty of Paying Living Expenses: Somewhat hard  Food Insecurity: No Food Insecurity   Worried About Running Out of Food in the Last Year: Never true   Ran Out of Food in the Last Year: Never true  Transportation Needs: No Transportation Needs   Lack of Transportation (Medical): No   Lack of Transportation (Non-Medical): No  Physical Activity: Inactive   Days of Exercise per Week: 0 days   Minutes of Exercise per Session: 0 min  Stress: Not on file  Social Connections: Not on file  Intimate Partner Violence: Not on file    Current Outpatient Medications on File Prior to Visit  Medication Sig Dispense Refill   acetaminophen (TYLENOL) 500 MG tablet Take 1,000 mg by mouth every 6 (six) hours as needed for moderate pain or headache.     aspirin 81 MG chewable tablet Chew 1 tablet (81 mg total) by mouth daily.     clopidogrel (PLAVIX) 75 MG tablet Take 1 tablet by mouth once daily 90 tablet 1   dapagliflozin propanediol (FARXIGA) 10 MG TABS tablet Take 1 tablet (10 mg total) by mouth daily before breakfast. 90 tablet 3   DULoxetine (CYMBALTA) 30 MG capsule Take 60 mg by mouth at bedtime.     furosemide (LASIX) 80 MG  tablet Take 1 tablet (80 mg total) by mouth daily. 30 tablet 5   gabapentin (NEURONTIN) 300 MG capsule Take 600 mg by mouth in the morning, at noon, and at bedtime.     levothyroxine (SYNTHROID, LEVOTHROID) 50 MCG tablet Take 50 mcg by mouth daily.  3   meclizine (ANTIVERT) 25 MG tablet Take 1-2 tablets (25-50 mg total) by mouth 3 (three) times daily as needed for dizziness. 30 tablet 0   metFORMIN (GLUCOPHAGE-XR) 500 MG 24 hr tablet Take 4 tablets (2,000 mg total) by mouth daily. 360 tablet 3   metoprolol succinate (TOPROL XL) 50 MG 24 hr tablet Take 1 tablet (50 mg total) by mouth daily. Take with or immediately following a meal. MAY TAKE EXTRA 25MG  FOR PALPITATIONS 45 tablet 11   nystatin (MYCOSTATIN/NYSTOP) powder Apply 1 application topically in the morning, at noon, in the evening, and at bedtime.     nystatin cream (MYCOSTATIN) Apply 1 application topically in the morning, at noon, in the evening, and at bedtime. Abdominal folds     omeprazole (PRILOSEC) 40 MG capsule Take 40 mg by mouth 2 (two) times daily.     ondansetron (ZOFRAN ODT) 8 MG disintegrating tablet Take 1 tablet (8 mg total) by mouth every 8 (eight) hours as needed for nausea or vomiting. 20 tablet 0   potassium chloride (KLOR-CON) 10 MEQ tablet Take 1 tablet by mouth once daily 30 tablet 5   rosuvastatin (CRESTOR) 20 MG tablet Take 1 tablet (20 mg total) by mouth every Monday, Wednesday, and Friday. Increase to daily as able to tolerate (Patient taking differently: Take 40 mg by mouth every Monday, Wednesday, and Friday. Increase to daily as able to tolerate) 39 tablet 0   spironolactone (ALDACTONE) 25 MG tablet Take 1 tablet by mouth once daily 30 tablet 5   No current facility-administered medications on file prior to visit.    Allergies  Allergen Reactions   Clindamycin/Lincomycin Other (See Comments) and Nausea And Vomiting    Stomach pain    Latex Rash   Ultram [Tramadol] Rash    Family History  Problem Relation  Age of Onset   Diabetes Mother    Diabetes Father    Heart disease Neg Hx     BP (!) 142/74 (BP Location: Left Arm, Patient Position: Sitting, Cuff Size: Normal)    Pulse 69    Ht 5\' 6"  (1.676 m)    SpO2 99%    BMI 58.91 kg/m   Review of Systems Nausea is improved, but persists.  She denies hypoglycemia    Objective:   Physical Exam  Lab Results  Component Value Date   CREATININE 0.96 01/10/2021   BUN 19 01/10/2021   NA 144 01/10/2021   K  3.7 01/10/2021   CL 103 01/10/2021   CO2 25 01/10/2021    Lab Results  Component Value Date   HGBA1C 9.2 (A) 02/27/2021      Assessment & Plan:  Insulin-requiring type 2 DM: uncontrolled Nausea: we discussed.  She declines to reduce metformin  Patient Instructions  check your blood sugar twice a day.  vary the time of day when you check, between before the 3 meals, and at bedtime.  also check if you have symptoms of your blood sugar being too high or too low.  please keep a record of the readings and bring it to your next appointment here (or you can bring the meter itself).  You can write it on any piece of paper.  please call us sooner if your blood sugar goes below 70, or if most of your readings are over 200.   We will need to take this complex situation in stages.   I have sent 2 prescriptions to your pharmacy: to change the insulin to 155 units with breakfast, and 125 units with supper.  Please continue the same other 2 diabetes medications.   Please come back for a follow-up appointment in 2 months.

## 2021-03-13 ENCOUNTER — Ambulatory Visit (HOSPITAL_BASED_OUTPATIENT_CLINIC_OR_DEPARTMENT_OTHER): Payer: Self-pay | Admitting: Family

## 2021-03-15 ENCOUNTER — Ambulatory Visit: Payer: 59 | Admitting: Dietician

## 2021-03-16 ENCOUNTER — Encounter: Payer: Medicaid Other | Attending: Endocrinology | Admitting: Dietician

## 2021-03-22 NOTE — Progress Notes (Signed)
? ? ? ? ?HPI: Follow-up coronary artery disease, congestive heart failure and hypertension.  Had non-ST elevation myocardial infarction March 2022.  Cardiac catheterization March 2022 showed 95% mid LAD which was treated with drug-eluting stent and left ventricular end-diastolic pressure of 27 mmHg.  Echocardiogram October 2022 showed normal LV function, mild left ventricular enlargement, mild left ventricular hypertrophy. Since last seen, she has multiple complaints.  She has dyspnea on exertion.  She also describes bilateral lower extremity edema worse when she leaves her legs down.  She has occasional pain in her right chest that is unlike her infarct pain.  She has nausea.  She denies syncope. ? ?Current Outpatient Medications  ?Medication Sig Dispense Refill  ? acetaminophen (TYLENOL) 500 MG tablet Take 1,000 mg by mouth every 6 (six) hours as needed for moderate pain or headache.    ? aspirin 81 MG chewable tablet Chew 1 tablet (81 mg total) by mouth daily.    ? clopidogrel (PLAVIX) 75 MG tablet Take 1 tablet by mouth once daily 90 tablet 1  ? dapagliflozin propanediol (FARXIGA) 10 MG TABS tablet Take 1 tablet (10 mg total) by mouth daily before breakfast. 90 tablet 3  ? DULoxetine (CYMBALTA) 30 MG capsule Take 60 mg by mouth at bedtime.    ? furosemide (LASIX) 80 MG tablet Take 1 tablet (80 mg total) by mouth daily. 30 tablet 5  ? gabapentin (NEURONTIN) 300 MG capsule Take 2 capsules by mouth 3 (three) times daily.    ? insulin NPH-regular Human (NOVOLIN 70/30 RELION) (70-30) 100 UNIT/ML injection 155 units with breakfast, and 125 units with supper, and needles 1/day 90 mL 3  ? levothyroxine (SYNTHROID, LEVOTHROID) 50 MCG tablet Take 50 mcg by mouth daily.  3  ? metFORMIN (GLUCOPHAGE-XR) 500 MG 24 hr tablet Take 4 tablets (2,000 mg total) by mouth daily. 360 tablet 3  ? metoprolol succinate (TOPROL XL) 50 MG 24 hr tablet Take 1 tablet (50 mg total) by mouth daily. Take with or immediately following a meal.  MAY TAKE EXTRA 25MG  FOR PALPITATIONS 45 tablet 11  ? nystatin (MYCOSTATIN/NYSTOP) powder Apply 1 application topically in the morning, at noon, in the evening, and at bedtime.    ? nystatin cream (MYCOSTATIN) Apply 1 application topically in the morning, at noon, in the evening, and at bedtime. Abdominal folds    ? omeprazole (PRILOSEC) 40 MG capsule Take 40 mg by mouth 2 (two) times daily.    ? potassium chloride (KLOR-CON) 10 MEQ tablet Take 1 tablet by mouth once daily 30 tablet 5  ? rosuvastatin (CRESTOR) 40 MG tablet Take 40 mg by mouth daily.    ? spironolactone (ALDACTONE) 25 MG tablet Take 1 tablet by mouth once daily 30 tablet 5  ? gabapentin (NEURONTIN) 300 MG capsule Take 600 mg by mouth in the morning, at noon, and at bedtime. (Patient not taking: Reported on 03/28/2021)    ? meclizine (ANTIVERT) 25 MG tablet Take 1-2 tablets (25-50 mg total) by mouth 3 (three) times daily as needed for dizziness. (Patient not taking: Reported on 03/28/2021) 30 tablet 0  ? ondansetron (ZOFRAN ODT) 8 MG disintegrating tablet Take 1 tablet (8 mg total) by mouth every 8 (eight) hours as needed for nausea or vomiting. (Patient not taking: Reported on 03/28/2021) 20 tablet 0  ? rosuvastatin (CRESTOR) 20 MG tablet Take 1 tablet (20 mg total) by mouth every Monday, Wednesday, and Friday. Increase to daily as able to tolerate (Patient not taking: Reported on  03/28/2021) 39 tablet 0  ? ?No current facility-administered medications for this visit.  ? ? ? ?Past Medical History:  ?Diagnosis Date  ? CAD (coronary artery disease)   ? a. s/p NSTEMI, LHC 03/2020 dRCA 25%, m-LAD-1 95%-0% (DES), mLAD-2 50%, LVEDP moderately elevated, 27 mmHg.  ? Chronic diastolic heart failure ([place])   ? a. LHC/echo 3/22 c/ elevated LVEDP, EF 50-55%, hypokinesis; b. Echo 10/22 EF 60-65%, mild LVH, borderline dilatation of ascending aorta, 56mm.  ? DDD (degenerative disc disease), lumbar   ? Diabetes mellitus without complication ([place])   ? GERD (gastroesophageal  reflux disease)   ? Hyperlipidemia   ? Hypertension   ? Hypothyroidism   ? Lumbar herniated disc   ? Major depressive disorder   ? Morbid obesity ([place])   ? ? ?Past Surgical History:  ?Procedure Laterality Date  ? CORONARY STENT INTERVENTION N/A 03/28/2020  ? Procedure: CORONARY STENT INTERVENTION;  Surgeon: Jettie Booze, MD;  Location: [place] [facility];  Service: Cardiovascular;  Laterality: N/A;  ? DILATION AND CURETTAGE OF UTERUS    ? INCISION AND DRAINAGE PERIRECTAL ABSCESS  06/21/2014  ? INCISION AND DRAINAGE PERIRECTAL ABSCESS Bilateral 06/21/2014  ? Procedure: IRRIGATION AND DEBRIDEMENT PERIRECTAL ABSCESS;  Surgeon: Coralie Keens, MD;  Location: [facility];  Service: General;  Laterality: Bilateral;  ? LEFT HEART CATH AND CORONARY ANGIOGRAPHY N/A 03/28/2020  ? Procedure: LEFT HEART CATH AND CORONARY ANGIOGRAPHY;  Surgeon: Jettie Booze, MD;  Location: [place] [facility];  Service: Cardiovascular;  Laterality: N/A;  ? ? ?Social History  ? ?Socioeconomic History  ? Marital status: Single  ?  Spouse name: Not on file  ? Number of children: Not on file  ? Years of education: Not on file  ? Highest education level: Not on file  ?Occupational History  ? Not on file  ?Tobacco Use  ? Smoking status: Never  ? Smokeless tobacco: Never  ?Vaping Use  ? Vaping Use: Never used  ?Substance and Sexual Activity  ? Alcohol use: No  ? Drug use: No  ? Sexual activity: Not on file  ?Other Topics Concern  ? Not on file  ?Social History Narrative  ? Not on file  ? ?Social Determinants of Health  ? ?Financial Resource Strain: Medium Risk  ? Difficulty of Paying Living Expenses: Somewhat hard  ?Food Insecurity: No Food Insecurity  ? Worried About Charity fundraiser in the Last Year: Never true  ? Ran Out of Food in the Last Year: Never true  ?Transportation Needs: No Transportation Needs  ? Lack of Transportation (Medical): No  ? Lack of Transportation (Non-Medical): No  ?Physical Activity: Inactive  ? Days of Exercise per  Week: 0 days  ? Minutes of Exercise per Session: 0 min  ?Stress: Not on file  ?Social Connections: Not on file  ?Intimate Partner Violence: Not on file  ? ? ?Family History  ?Problem Relation Age of Onset  ? Diabetes Mother   ? Diabetes Father   ? Heart disease Neg Hx   ? ? ?ROS: Peripheral neuropathy but no fevers or chills, productive cough, hemoptysis, dysphasia, odynophagia, melena, hematochezia, dysuria, hematuria, rash, seizure activity, orthopnea, PND, claudication. Remaining systems are negative. ? ?Physical Exam: ?Well-developed morbidly obese in no acute distress.  ?Skin is warm and dry.  ?HEENT is normal.  ?Neck is supple.  ?Chest is clear to auscultation with normal expansion.  ?Cardiovascular exam is regular rate and rhythm.  ?Abdominal exam nontender or distended. No  masses palpated. ?Extremities show trace to 1+ edema. ?neuro grossly intact ? ?ECG-normal sinus rhythm, left axis deviation, no ST changes.  Personally reviewed ? ?A/P ? ?1 coronary artery disease-plan to continue medical therapy; continue aspirin and statin.  When she finishes her present prescription of Plavix this will be discontinued as she is now 1 year status post PCI.  Note LV function is normal. ? ?2 chronic diastolic congestive heart failure-exam is difficult due to obesity.  She does note increased lower extremity edema.  We will continue Lasix at present dose.  Check potassium, renal function and BNP.  May need to increase Lasix or change to [facility] in the future. ? ?3 chronic dyspnea/respiratory failure-patient is on home oxygen.  Pulmonary to see for possible sleep apnea. ? ?4 hypertension-patient's blood pressure is elevated; add Avapro 150 mg daily.  In 1 week check potassium and renal function. ? ?5 hyperlipidemia-given documented coronary disease will increase Crestor to 40 mg daily.  Check lipids and liver in 8 weeks. ? ?6 morbid obesity-needs weight loss. ? ?Kirk Ruths, MD ? ? ? ?

## 2021-03-26 ENCOUNTER — Institutional Professional Consult (permissible substitution): Payer: Self-pay | Admitting: Pulmonary Disease

## 2021-03-28 ENCOUNTER — Encounter: Payer: Self-pay | Admitting: Cardiology

## 2021-03-28 ENCOUNTER — Other Ambulatory Visit: Payer: Self-pay

## 2021-03-28 ENCOUNTER — Ambulatory Visit: Payer: BC Managed Care – PPO | Admitting: Cardiology

## 2021-03-28 VITALS — BP 157/75 | HR 73 | Ht 66.0 in | Wt 346.4 lb

## 2021-03-28 DIAGNOSIS — I2510 Atherosclerotic heart disease of native coronary artery without angina pectoris: Secondary | ICD-10-CM | POA: Diagnosis not present

## 2021-03-28 DIAGNOSIS — E1169 Type 2 diabetes mellitus with other specified complication: Secondary | ICD-10-CM

## 2021-03-28 DIAGNOSIS — I10 Essential (primary) hypertension: Secondary | ICD-10-CM

## 2021-03-28 DIAGNOSIS — I5032 Chronic diastolic (congestive) heart failure: Secondary | ICD-10-CM

## 2021-03-28 DIAGNOSIS — E785 Hyperlipidemia, unspecified: Secondary | ICD-10-CM | POA: Diagnosis not present

## 2021-03-28 DIAGNOSIS — E782 Mixed hyperlipidemia: Secondary | ICD-10-CM

## 2021-03-28 MED ORDER — POTASSIUM CHLORIDE ER 10 MEQ PO TBCR
20.0000 meq | EXTENDED_RELEASE_TABLET | Freq: Every day | ORAL | 3 refills | Status: DC
Start: 2021-03-28 — End: 2022-10-03

## 2021-03-28 MED ORDER — IRBESARTAN 150 MG PO TABS
150.0000 mg | ORAL_TABLET | Freq: Every day | ORAL | 3 refills | Status: DC
Start: 2021-03-28 — End: 2021-04-09

## 2021-03-28 MED ORDER — FUROSEMIDE 80 MG PO TABS: ORAL_TABLET | ORAL | 3 refills | Status: DC

## 2021-03-28 NOTE — Patient Instructions (Signed)
Medication Instructions:  ? ? ?STOP PLVIX WHEN CURRENT SUPPLY COMPLETE ? ?START IRBESARTAN 150 MG ONCE DAILY ? ?*If you need a refill on your cardiac medications before your next appointment, please call your pharmacy* ? ? ?Lab Work: ? ?Your physician recommends that you return for lab work in: ONE WEEK- DO NOT NEED TO FAST ? ?Your physician recommends that you return for lab work in: [facility] ? ?If you have labs (blood work) drawn today and your tests are completely normal, you will receive your results only by: ?MyChart Message (if you have MyChart) OR ?A paper copy in the mail ?If you have any lab test that is abnormal or we need to change your treatment, we will call you to review the results. ? ? ?Follow-Up: ?At [facility], you and your health needs are our priority.  As part of our continuing mission to provide you with exceptional heart care, we have created designated Provider Care Teams.  These Care Teams include your primary Cardiologist (physician) and Advanced Practice Providers (APPs -  Physician Assistants and Nurse Practitioners) who all work together to provide you with the care you need, when you need it. ? ?We recommend signing up for the patient portal called "MyChart".  Sign up information is provided on this After Visit Summary.  MyChart is used to connect with patients for Virtual Visits (Telemedicine).  Patients are able to view lab/test results, encounter notes, upcoming appointments, etc.  Non-urgent messages can be sent to your provider as well.   ?To learn more about what you can do with MyChart, go to [email or web address].   ? ?Your next appointment:   ?3 month(s) ? ?The format for your next appointment:   ?In Person ? ?Provider:   ?JESSE CLEAVER NP OR CALLIE GOODRICH PA-C ? ?Your physician recommends that you schedule a follow-up appointment in: 6 MONTHS WITH DR Jens Som ? ? ? ?

## 2021-04-06 ENCOUNTER — Other Ambulatory Visit: Payer: Self-pay

## 2021-04-06 ENCOUNTER — Emergency Department (HOSPITAL_COMMUNITY): Payer: BC Managed Care – PPO

## 2021-04-06 ENCOUNTER — Inpatient Hospital Stay (HOSPITAL_COMMUNITY)
Admission: EM | Admit: 2021-04-06 | Discharge: 2021-04-09 | DRG: 683 | Disposition: A | Discharge status: Home / Self Care | Payer: BC Managed Care – PPO | Attending: Internal Medicine | Admitting: Internal Medicine

## 2021-04-06 ENCOUNTER — Encounter (HOSPITAL_COMMUNITY): Payer: Self-pay | Admitting: *Deleted

## 2021-04-06 DIAGNOSIS — E039 Hypothyroidism, unspecified: Secondary | ICD-10-CM | POA: Diagnosis not present

## 2021-04-06 DIAGNOSIS — I952 Hypotension due to drugs: Secondary | ICD-10-CM

## 2021-04-06 DIAGNOSIS — Z833 Family history of diabetes mellitus: Secondary | ICD-10-CM

## 2021-04-06 DIAGNOSIS — Z20822 Contact with and (suspected) exposure to covid-19: Secondary | ICD-10-CM | POA: Diagnosis not present

## 2021-04-06 DIAGNOSIS — I959 Hypotension, unspecified: Secondary | ICD-10-CM | POA: Diagnosis not present

## 2021-04-06 DIAGNOSIS — G4733 Obstructive sleep apnea (adult) (pediatric): Secondary | ICD-10-CM | POA: Diagnosis present

## 2021-04-06 DIAGNOSIS — J9611 Chronic respiratory failure with hypoxia: Secondary | ICD-10-CM | POA: Diagnosis not present

## 2021-04-06 DIAGNOSIS — F32A Depression, unspecified: Secondary | ICD-10-CM | POA: Diagnosis not present

## 2021-04-06 DIAGNOSIS — Z79899 Other long term (current) drug therapy: Secondary | ICD-10-CM | POA: Diagnosis not present

## 2021-04-06 DIAGNOSIS — Z9981 Dependence on supplemental oxygen: Secondary | ICD-10-CM

## 2021-04-06 DIAGNOSIS — R0602 Shortness of breath: Secondary | ICD-10-CM | POA: Diagnosis not present

## 2021-04-06 DIAGNOSIS — E282 Polycystic ovarian syndrome: Secondary | ICD-10-CM | POA: Diagnosis present

## 2021-04-06 DIAGNOSIS — N179 Acute kidney failure, unspecified: Secondary | ICD-10-CM | POA: Diagnosis not present

## 2021-04-06 DIAGNOSIS — E785 Hyperlipidemia, unspecified: Secondary | ICD-10-CM | POA: Diagnosis not present

## 2021-04-06 DIAGNOSIS — Z794 Long term (current) use of insulin: Secondary | ICD-10-CM | POA: Diagnosis not present

## 2021-04-06 DIAGNOSIS — M5136 Other intervertebral disc degeneration, lumbar region: Secondary | ICD-10-CM | POA: Diagnosis present

## 2021-04-06 DIAGNOSIS — E119 Type 2 diabetes mellitus without complications: Secondary | ICD-10-CM | POA: Insufficient documentation

## 2021-04-06 DIAGNOSIS — I252 Old myocardial infarction: Secondary | ICD-10-CM

## 2021-04-06 DIAGNOSIS — I5032 Chronic diastolic (congestive) heart failure: Secondary | ICD-10-CM | POA: Diagnosis present

## 2021-04-06 DIAGNOSIS — I444 Left anterior fascicular block: Secondary | ICD-10-CM | POA: Diagnosis present

## 2021-04-06 DIAGNOSIS — Z885 Allergy status to narcotic agent status: Secondary | ICD-10-CM

## 2021-04-06 DIAGNOSIS — Z955 Presence of coronary angioplasty implant and graft: Secondary | ICD-10-CM

## 2021-04-06 DIAGNOSIS — Z7984 Long term (current) use of oral hypoglycemic drugs: Secondary | ICD-10-CM | POA: Diagnosis not present

## 2021-04-06 DIAGNOSIS — Z7982 Long term (current) use of aspirin: Secondary | ICD-10-CM | POA: Diagnosis not present

## 2021-04-06 DIAGNOSIS — Z79890 Hormone replacement therapy (postmenopausal): Secondary | ICD-10-CM

## 2021-04-06 DIAGNOSIS — E1142 Type 2 diabetes mellitus with diabetic polyneuropathy: Secondary | ICD-10-CM

## 2021-04-06 DIAGNOSIS — K219 Gastro-esophageal reflux disease without esophagitis: Secondary | ICD-10-CM | POA: Diagnosis present

## 2021-04-06 DIAGNOSIS — I2510 Atherosclerotic heart disease of native coronary artery without angina pectoris: Secondary | ICD-10-CM | POA: Diagnosis present

## 2021-04-06 DIAGNOSIS — Z881 Allergy status to other antibiotic agents status: Secondary | ICD-10-CM

## 2021-04-06 DIAGNOSIS — I110 Hypertensive heart disease with heart failure: Secondary | ICD-10-CM | POA: Diagnosis present

## 2021-04-06 DIAGNOSIS — R079 Chest pain, unspecified: Secondary | ICD-10-CM | POA: Diagnosis not present

## 2021-04-06 DIAGNOSIS — Z6841 Body Mass Index (BMI) 40.0 and over, adult: Secondary | ICD-10-CM | POA: Diagnosis not present

## 2021-04-06 DIAGNOSIS — Z91040 Latex allergy status: Secondary | ICD-10-CM

## 2021-04-06 DIAGNOSIS — R42 Dizziness and giddiness: Secondary | ICD-10-CM | POA: Diagnosis not present

## 2021-04-06 DIAGNOSIS — R52 Pain, unspecified: Secondary | ICD-10-CM | POA: Diagnosis present

## 2021-04-06 LAB — BASIC METABOLIC PANEL
Anion gap: 13 (ref 5–15)
BUN: 37 mg/dL — ABNORMAL HIGH (ref 6–20)
CO2: 26 mmol/L (ref 22–32)
Calcium: 9.2 mg/dL (ref 8.9–10.3)
Chloride: 100 mmol/L (ref 98–111)
Creatinine, Ser: 1.69 mg/dL — ABNORMAL HIGH (ref 0.44–1.00)
GFR, Estimated: 37 mL/min — ABNORMAL LOW (ref >60)
Glucose, Bld: 112 mg/dL — ABNORMAL HIGH (ref 70–99)
Potassium: 4.3 mmol/L (ref 3.5–5.1)
Sodium: 139 mmol/L (ref 135–145)

## 2021-04-06 LAB — RESP PANEL BY RT-PCR (FLU A&B, COVID) ARPGX2
Influenza A by PCR: NEGATIVE
Influenza B by PCR: NEGATIVE
SARS Coronavirus 2 by RT PCR: NEGATIVE

## 2021-04-06 LAB — CBC
HCT: 37.5 % (ref 36.0–46.0)
Hemoglobin: 12 g/dL (ref 12.0–15.0)
MCH: 27 pg (ref 26.0–34.0)
MCHC: 32 g/dL (ref 30.0–36.0)
MCV: 84.5 fL (ref 80.0–100.0)
Platelets: 210 10*3/uL (ref 150–400)
RBC: 4.44 MIL/uL (ref 3.87–5.11)
RDW: 16.2 % — ABNORMAL HIGH (ref 11.5–15.5)
WBC: 10.6 10*3/uL — ABNORMAL HIGH (ref 4.0–10.5)
nRBC: 0 % (ref 0.0–0.2)

## 2021-04-06 LAB — I-STAT BETA HCG BLOOD, ED (MC, WL, AP ONLY): I-stat hCG, quantitative: <5 m[IU]/mL (ref <5)

## 2021-04-06 LAB — CBG MONITORING, ED: Glucose-Capillary: 81 mg/dL (ref 70–99)

## 2021-04-06 LAB — LACTIC ACID, PLASMA: Lactic Acid, Venous: 3.3 mmol/L (ref 0.5–1.9)

## 2021-04-06 LAB — BRAIN NATRIURETIC PEPTIDE: B Natriuretic Peptide: 22.8 pg/mL (ref 0.0–100.0)

## 2021-04-06 LAB — GLUCOSE, CAPILLARY: Glucose-Capillary: 112 mg/dL — ABNORMAL HIGH (ref 70–99)

## 2021-04-06 LAB — TROPONIN I (HIGH SENSITIVITY)
Troponin I (High Sensitivity): 9 ng/L (ref <18)
Troponin I (High Sensitivity): 7 ng/L (ref <18)

## 2021-04-06 MED ORDER — PANTOPRAZOLE SODIUM 40 MG PO TBEC
40.0000 mg | DELAYED_RELEASE_TABLET | Freq: Every day | ORAL | Status: DC
  Administered 2021-04-07 – 2021-04-09 (×3): 40 mg via ORAL
  Filled 2021-04-06 (×3): qty 1

## 2021-04-06 MED ORDER — SODIUM CHLORIDE 0.9 % IV BOLUS
250.0000 mL | Freq: Once | INTRAVENOUS | Status: AC
Start: 2021-04-06 — End: 2021-04-06
  Administered 2021-04-06: 250 mL via INTRAVENOUS

## 2021-04-06 MED ORDER — DAPAGLIFLOZIN PROPANEDIOL 10 MG PO TABS
10.0000 mg | ORAL_TABLET | Freq: Every day | ORAL | Status: DC
  Administered 2021-04-07 – 2021-04-09 (×3): 10 mg via ORAL
  Filled 2021-04-06 (×4): qty 1

## 2021-04-06 MED ORDER — GABAPENTIN 300 MG PO CAPS
600.0000 mg | ORAL_CAPSULE | Freq: Three times a day (TID) | ORAL | Status: DC
  Administered 2021-04-07 – 2021-04-09 (×8): 600 mg via ORAL
  Filled 2021-04-06 (×8): qty 2

## 2021-04-06 MED ORDER — MAGNESIUM HYDROXIDE 400 MG/5ML PO SUSP: 30.0000 mL | Freq: Every day | ORAL | Status: DC | PRN

## 2021-04-06 MED ORDER — TRAZODONE HCL 50 MG PO TABS
25.0000 mg | ORAL_TABLET | Freq: Every evening | ORAL | Status: DC | PRN
  Administered 2021-04-07: 25 mg via ORAL
  Filled 2021-04-06: qty 1

## 2021-04-06 MED ORDER — ASPIRIN 81 MG PO CHEW
81.0000 mg | CHEWABLE_TABLET | Freq: Every day | ORAL | Status: DC
  Administered 2021-04-07 – 2021-04-09 (×3): 81 mg via ORAL
  Filled 2021-04-06 (×3): qty 1

## 2021-04-06 MED ORDER — SODIUM CHLORIDE 0.9 % IV SOLN: INTRAVENOUS | Status: DC

## 2021-04-06 MED ORDER — ENOXAPARIN SODIUM 80 MG/0.8ML IJ SOSY
80.0000 mg | PREFILLED_SYRINGE | INTRAMUSCULAR | Status: DC
  Administered 2021-04-07 – 2021-04-08 (×3): 80 mg via SUBCUTANEOUS
  Filled 2021-04-06 (×4): qty 0.8

## 2021-04-06 MED ORDER — ACETAMINOPHEN 325 MG PO TABS
650.0000 mg | ORAL_TABLET | Freq: Four times a day (QID) | ORAL | Status: DC | PRN
Start: 2021-04-06 — End: 2021-04-09
  Filled 2021-04-06: qty 2

## 2021-04-06 MED ORDER — ROSUVASTATIN CALCIUM 20 MG PO TABS: 40.0000 mg | ORAL_TABLET | Freq: Every day | ORAL | Status: DC

## 2021-04-06 MED ORDER — INSULIN ASPART PROT & ASPART (70-30 MIX) 100 UNIT/ML ~~LOC~~ SUSP
150.0000 [IU] | Freq: Every day | SUBCUTANEOUS | Status: DC
  Administered 2021-04-07: 150 [IU] via SUBCUTANEOUS
  Filled 2021-04-06: qty 10

## 2021-04-06 MED ORDER — DULOXETINE HCL 60 MG PO CPEP
60.0000 mg | ORAL_CAPSULE | Freq: Every day | ORAL | Status: DC
  Administered 2021-04-07 – 2021-04-08 (×3): 60 mg via ORAL
  Filled 2021-04-06 (×4): qty 1

## 2021-04-06 MED ORDER — ONDANSETRON HCL 4 MG/2ML IJ SOLN
4.0000 mg | Freq: Four times a day (QID) | INTRAMUSCULAR | Status: DC | PRN
Start: 2021-04-06 — End: 2021-04-09
  Filled 2021-04-06: qty 2

## 2021-04-06 MED ORDER — INSULIN ASPART PROT & ASPART (70-30 MIX) 100 UNIT/ML ~~LOC~~ SUSP
155.0000 [IU] | SUBCUTANEOUS | Status: DC
Start: 2021-04-06 — End: 2021-04-06

## 2021-04-06 MED ORDER — NYSTATIN 100000 UNIT/GM EX CREA
1.0000 "application " | TOPICAL_CREAM | Freq: Four times a day (QID) | CUTANEOUS | Status: DC
  Administered 2021-04-07 – 2021-04-09 (×5): 1 via TOPICAL
  Filled 2021-04-06 (×2): qty 30

## 2021-04-06 MED ORDER — NYSTATIN 100000 UNIT/GM EX POWD
1.0000 "application " | Freq: Four times a day (QID) | CUTANEOUS | Status: DC
  Administered 2021-04-07 – 2021-04-09 (×6): 1 via TOPICAL
  Filled 2021-04-06 (×2): qty 15

## 2021-04-06 MED ORDER — ACETAMINOPHEN 650 MG RE SUPP: 650.0000 mg | Freq: Four times a day (QID) | RECTAL | Status: DC | PRN

## 2021-04-06 MED ORDER — METOPROLOL SUCCINATE ER 25 MG PO TB24
50.0000 mg | ORAL_TABLET | Freq: Every day | ORAL | Status: DC
  Administered 2021-04-07 – 2021-04-08 (×2): 50 mg via ORAL
  Filled 2021-04-06 (×3): qty 2

## 2021-04-06 MED ORDER — LEVOTHYROXINE SODIUM 50 MCG PO TABS
50.0000 ug | ORAL_TABLET | Freq: Every day | ORAL | Status: DC
  Administered 2021-04-07 – 2021-04-09 (×3): 50 ug via ORAL
  Filled 2021-04-06 (×3): qty 1

## 2021-04-06 MED ORDER — MECLIZINE HCL 25 MG PO TABS
25.0000 mg | ORAL_TABLET | Freq: Three times a day (TID) | ORAL | Status: DC | PRN
  Administered 2021-04-08: 50 mg via ORAL
  Filled 2021-04-06 (×2): qty 2

## 2021-04-06 MED ORDER — INSULIN ASPART PROT & ASPART (70-30 MIX) 100 UNIT/ML ~~LOC~~ SUSP
125.0000 [IU] | Freq: Every day | SUBCUTANEOUS | Status: DC
  Filled 2021-04-06: qty 10

## 2021-04-06 MED ORDER — ONDANSETRON HCL 4 MG PO TABS: 4.0000 mg | ORAL_TABLET | Freq: Four times a day (QID) | ORAL | Status: DC | PRN

## 2021-04-06 NOTE — Assessment & Plan Note (Signed)
-   She will be admitted to an observation medical telemetry bed. ?- We will continue hydration with IV normal saline. ?- We will follow her BMP. ?- We will hold off nephrotoxins. ?

## 2021-04-06 NOTE — Assessment & Plan Note (Signed)
-   We will continue her Synthroid. 

## 2021-04-06 NOTE — ED Notes (Signed)
Lactic acid 3.3. PA made aware.  ?

## 2021-04-06 NOTE — ED Triage Notes (Signed)
The pt was given a new med for bp and the pt has taken one pill 2 days ago and now she has generalized weakness and sob  in shoulders the top and the back of her head and pain in her hip and lower back  pain in her jaw also  lmp one month ago ?

## 2021-04-06 NOTE — ED Provider Notes (Signed)
[facility] EMERGENCY DEPARTMENT Provider Note   CSN: [id number] Arrival date & time: 04/06/21  1819     History  Chief Complaint  Patient presents with   Generalized Body Aches   Dizziness    [name] is a 51 y.o. female.  HPI   Pt is a 51 y/o female with a h/o CAD, CHF, DDD, DM, GERD, HLD, HTN, hypothyroidism, MDD, morbid obesity, who presents to the Ed today for eval of lightheadedness and dizziness for the last 3-4 days. She states she just recently saw her cardiologist and was started on a new blood pressure medication. She states she took one dose and an hour later she experienced muscle pain to her bilat upper back and shoulders that radiated up the back of her head and neck. She also had some jaw pain at that time.    1 hour after taking med bp dropped to 75/41, became lightheaded, dizzy and she experienced bilat shoulder pain, that radiated up the back/neck and head, jaw pain and  - took it 3 days ago  States since then her bp have been low at home and she has continued to feel lighthaeded  Denies chest pain, fevers, chills, change in abd pain.  She has chronic nausea, abd pain and constipation and that is unchanged.   Home Medications Prior to Admission medications   Medication Sig Start Date End Date Taking? Authorizing Provider  acetaminophen (TYLENOL) 500 MG tablet Take 1,000 mg by mouth every 6 (six) hours as needed for moderate pain or headache.    [provider]  aspirin 81 MG chewable tablet Chew 1 tablet (81 mg total) by mouth daily. 04/04/20   Lewie Chamber, MD  dapagliflozin propanediol (FARXIGA) 10 MG TABS tablet Take 1 tablet (10 mg total) by mouth daily before breakfast. 01/01/21   Romero Belling, MD  DULoxetine (CYMBALTA) 30 MG capsule Take 60 mg by mouth at bedtime. 09/21/20   [provider]  furosemide (LASIX) 80 MG tablet 1 AND 1/2 TABLET ONCE DAILY 03/28/21   Lewayne Bunting, MD  gabapentin (NEURONTIN) 300 MG  capsule Take 600 mg by mouth in the morning, at noon, and at bedtime. Patient not taking: Reported on 03/28/2021 07/26/20   [provider]  gabapentin (NEURONTIN) 300 MG capsule Take 2 capsules by mouth 3 (three) times daily. 03/23/21   [provider]  insulin NPH-regular Human (NOVOLIN 70/30 RELION) (70-30) 100 UNIT/ML injection 155 units with breakfast, and 125 units with supper, and needles 1/day 02/27/21   Romero Belling, MD  irbesartan (AVAPRO) 150 MG tablet Take 1 tablet (150 mg total) by mouth daily. 03/28/21   Lewayne Bunting, MD  levothyroxine (SYNTHROID, LEVOTHROID) 50 MCG tablet Take 50 mcg by mouth daily. 04/15/14   [provider]  meclizine (ANTIVERT) 25 MG tablet Take 1-2 tablets (25-50 mg total) by mouth 3 (three) times daily as needed for dizziness. Patient not taking: Reported on 03/28/2021 04/21/20   Linwood Dibbles, MD  metFORMIN (GLUCOPHAGE-XR) 500 MG 24 hr tablet Take 4 tablets (2,000 mg total) by mouth daily. 01/01/21   Romero Belling, MD  metoprolol succinate (TOPROL XL) 50 MG 24 hr tablet Take 1 tablet (50 mg total) by mouth daily. Take with or immediately following a meal. MAY TAKE EXTRA  FOR PALPITATIONS 06/06/20 06/06/21  Ronney Asters, NP  nystatin (MYCOSTATIN/NYSTOP) powder Apply 1 application topically in the morning, at noon, in the evening, and at bedtime.    [provider]  nystatin cream (MYCOSTATIN) Apply 1 application topically in the morning, at noon, in the evening, and at bedtime. Abdominal folds    [provider]  omeprazole (PRILOSEC) 40 MG capsule Take 40 mg by mouth 2 (two) times daily.    [provider]  ondansetron (ZOFRAN ODT) 8 MG disintegrating tablet Take 1 tablet (8 mg total) by mouth every 8 (eight) hours as needed for nausea or vomiting. Patient not taking: Reported on 03/28/2021 04/21/20   Linwood Dibbles, MD  potassium chloride (KLOR-CON) 10 MEQ tablet Take 2 tablets (20 mEq total) by mouth daily. 03/28/21    Lewayne Bunting, MD  rosuvastatin (CRESTOR) 20 MG tablet Take 1 tablet (20 mg total) by mouth every Monday, Wednesday, and Friday. Increase to daily as able to tolerate Patient not taking: Reported on 03/28/2021 02/02/21   Ronney Asters, NP  rosuvastatin (CRESTOR) 40 MG tablet Take 40 mg by mouth daily. 03/18/21   [provider]  spironolactone (ALDACTONE) 25 MG tablet Take 1 tablet by mouth once daily 12/07/20   Ronney Asters, NP      Allergies    Clindamycin/lincomycin, Latex, and Ultram [tramadol]    Review of Systems   Review of Systems See HPI for pertinent positives or negatives.   Physical Exam Updated Vital Signs BP 104/62    Pulse 81    Temp 98.1 F (36.7 C) (Oral)    Resp 15    Ht 5\' 6"  (1.676 m)    Wt (!) 157.1 kg    LMP 03/09/2021    SpO2 100%    BMI 55.90 kg/m  Physical Exam Vitals and nursing note reviewed.  Constitutional:      General: She is not in acute distress.    Appearance: She is well-developed.  HENT:     Head: Normocephalic and atraumatic.     Mouth/Throat:     Mouth: Mucous membranes are dry.  Eyes:     Conjunctiva/sclera: Conjunctivae normal.  Cardiovascular:     Rate and Rhythm: Normal rate and regular rhythm.     Heart sounds: Normal heart sounds. No murmur heard. Pulmonary:     Effort: Pulmonary effort is normal.     Breath sounds: Normal breath sounds.     Comments: Decreased breath sounds throughout Abdominal:     General: Bowel sounds are normal.     Palpations: Abdomen is soft.     Tenderness: There is no abdominal tenderness.  Musculoskeletal:        General: No swelling.     Cervical back: Neck supple.     Right lower leg: Edema present.     Left lower leg: Edema present.  Skin:    General: Skin is warm and dry.     Capillary Refill: Capillary refill takes less than 2 seconds.  Neurological:     Mental Status: She is alert.  Psychiatric:        Mood and Affect: Mood normal.     ED Results / Procedures /  Treatments   Labs (all labs ordered are listed, but only abnormal results are displayed) Labs Reviewed  BASIC METABOLIC PANEL - Abnormal; Notable for the following components:      Result Value   Glucose, Bld 112 (*)    BUN 37 (*)    Creatinine, Ser 1.69 (*)    GFR, Estimated 37 (*)    All other components within normal limits  CBC - Abnormal; Notable for the following components:  WBC 10.6 (*)    RDW 16.2 (*)    All other components within normal limits  CULTURE, BLOOD (ROUTINE X 2)  CULTURE, BLOOD (ROUTINE X 2)  RESP PANEL BY RT-PCR (FLU A&B, COVID) ARPGX2  LACTIC ACID, PLASMA  LACTIC ACID, PLASMA  BRAIN NATRIURETIC PEPTIDE  I-STAT BETA HCG BLOOD, ED ([facility], [facility], [facility] ONLY)  TROPONIN I (HIGH SENSITIVITY)  TROPONIN I (HIGH SENSITIVITY)    EKG None  Radiology DG Chest 2 View  Result Date: 04/06/2021 CLINICAL DATA:  Chest pain dizziness.  Shortness of breath. EXAM: CHEST - 2 VIEW COMPARISON:  04/14/2020 FINDINGS: There is are moderately decreased lung volumes worsened from prior. Cardiac silhouette again appears mildly enlarged. Mediastinal contours are grossly unremarkable. Mild bilateral interstitial thickening may be secondary to the low lung volumes. No focal airspace opacity. No definite pleural effusion. No pneumothorax. No acute skeletal abnormality. IMPRESSION: Mild bilateral interstitial thickening may be secondary to moderately decreased lung volumes. Cannot entirely exclude minimal interstitial pulmonary edema. Electronically Signed   By: Neita Garnet M.D.   On: 04/06/2021 19:20    Procedures Procedures    Medications Ordered in ED Medications  sodium chloride 0.9 % bolus 250 mL (250 mLs Intravenous New Bag/Given 04/06/21 2112)    ED Course/ Medical Decision Making/ A&P                           Medical Decision Making Amount and/or Complexity of Data Reviewed Labs: ordered. Radiology: ordered.   This patient presents to the ED for concern of  lightheadedness/dizziness, this involves an extensive number of treatment options, and is a complaint that carries with it a high risk of complications and morbidity.  The differential diagnosis includes but is not limited to dehydration, electrolyte derangement, anemia, acs   Comorbidities that complicate the patient evaluation: Patients presentation is complicated by their history of CAD, CHF, DDD, DM, GERD, HLD, HTN, hypothyroidism, MDD, morbid obesity  Additional history obtained: Records reviewed Care Everywhere/External Records and Primary Care Documents  Lab Tests: I Ordered, and personally interpreted labs.  The pertinent results include:   CBC showed mild leukocytosis, no anemia BMP with Aki, last cr was wnl, elevated bun. Otherwise reassuring Trop negative Bnp pending on admission  Beta hcg Lactic acid pending on admission  Blood cultures obtained pending on admission  - lower suspicion for sepsis  Imaging Studies ordered: I ordered, independently visualized, and interpreted imaging which showed  CXR - Mild bilateral interstitial thickening may be secondary to moderately decreased lung volumes. Cannot entirely exclude minimal interstitial pulmonary edema.  I agree with the radiologist interpretation  Cardiac Monitoring: The patient was maintained on a cardiac monitor.  I personally viewed and interpreted the cardiac monitor which showed an underlying rhythm of:  sinus rhythm  Medicines ordered and prescription drug management: I ordered medication including ivf  for dehydration   Critical Interventions: hydration  Complexity of problems addressed: Patients presentation is most consistent with  acute presentation with potential threat to life or bodily function  Disposition: After consideration of the diagnostic results and the patients response to treatment,  I feel that the patent would benefit from admission for further treatment of AKI suspect prerenal cause from  dehydration from recent increase in her Lasix.  She also has been hypotensive at home and I suspect this is multifactorial in setting of increased diuresis dosing and addition of second antihypertensive. .  Blood pressures are borderline to low  here with the lowest reading of 85 systolic on my evaluation.  When checking orthostatics patient was unable to stand due to lightheadedness/dizziness so we will plan for admission.  Consultations Obtained: 9:40 PM  I consulted with the admitting physician Dr. Arville CareMansy, and discussed  findings as well as pertinent plan - he accepts patient for admission   Final Clinical Impression(s) / ED Diagnoses Final diagnoses:  AKI (acute kidney injury) [facility](HCC)    Rx / DC Orders ED Discharge Orders     None         Rayne DuCouture, Jamita Mckelvin S, PA-C 04/06/21 2142    Gwyneth SproutPlunkett, Whitney, MD 04/06/21 2241

## 2021-04-06 NOTE — Assessment & Plan Note (Deleted)
-   We will continue her diabetic regimen with her Novolin 70/30 basal coverage. ?- We will continue her Marcelline Deist and hold off metformin given acute kidney injury ?- She will be placed on supplement coverage with NovoLog. ?

## 2021-04-06 NOTE — Assessment & Plan Note (Signed)
-   We will place on supplement coverage with NovoLog. ?- We will continue basal coverage with Novolin 70/30. ?- We will continue her [place]. ?- We will hold off her metformin given acute kidney injury. ?- We will continue her Neurontin. ?

## 2021-04-06 NOTE — Assessment & Plan Note (Signed)
-   We will continue statin therapy. 

## 2021-04-06 NOTE — Assessment & Plan Note (Signed)
-   We will continue aspirin and Plavix. ?- We will continue beta-blocker therapy with Toprol-XL as well as statin therapy. ?

## 2021-04-06 NOTE — Assessment & Plan Note (Signed)
We will continue Cymbalta. 

## 2021-04-06 NOTE — Assessment & Plan Note (Signed)
-   This likely estrogenic from ARB addition.  The patient apparently has not tolerated it. ?- She stopped her irbesartan. ?- We will hold off diuretics for now given her acute kidney injury. ?- We will closely monitor her BP while she is here. ?

## 2021-04-06 NOTE — H&P (Signed)
[place]   PATIENT NAME: [name]    MR#:  [id number]  DATE OF BIRTH:  06/30/1970  DATE OF ADMISSION:  04/06/2021  PRIMARY CARE PHYSICIAN: Elizabeth PalauAnderson, Teresa, FNP   Patient is coming from: Home  REQUESTING/REFERRING PHYSICIAN: Couture, Cortni S, PA-C  CHIEF COMPLAINT:   Chief Complaint  Patient presents with   Generalized Body Aches   Dizziness    HISTORY OF PRESENT ILLNESS:  Caitlin MustardCrystal [name] is a 51 y.o. female with medical history significant for morbid obesity, CAD, chronic diastolic CHF, lumbar DDD, type 2 diabetes mellitus, hypertension, dyslipidemia, GERD, hypothyroidism chronic respiratory failure on home O2 at 2 L/min by [place] and major depression, who presented to the ER with acute onset of lightheadedness and dizziness an hour after taking irbesartan was added by Dr. Jens Somrenshaw her cardiologist for elevated blood pressure with associated shoulder muscle pain and soreness in the jaw.  She also had her Lasix increased to 80 mg p.o. daily.  She admitted to nausea with her symptoms without vomiting or diarrhea.  She denied any chest pain or palpitations however experienced mild dyspnea.  No fever or chills.  No dysuria, oliguria, hematuria, urinary frequency or urgency or flank pain.  She denies any orthopnea or paroxysmal nocturnal dyspnea or worsening lower extremity edema.  ED Course: When she came to the ER, BP was 162/105 and later 113/57 with a heart rate of 76 and respirate of 24.  Pulse oximetry was 100 % on 2 L of O2 by nasal cannula.  Labs revealed a BUN of 37 and creatinine 1.69 compared to 19/0.96 on 01/10/2021.  High sensitive troponin I was 9 and later 7.  BNP was 22.8.  Lactic acid was 3.3.  CBC showed WBC of 10.6.  Urine pregnancy test was negative. EKG as reviewed by me : EKG showed normal sinus rhythm with a rate of 75 with left anterior fascicular block. Imaging: Two-view chest x-ray showed mild bilateral interstitial thickening that may be secondary to  moderately decreased lung volume with inability to exclude mild interstitial pulmonary edema.  She was given a bolus of 250 mL IV normal saline.  She will be admitted to a medical telemetry observation bed for further evaluation and management. PAST MEDICAL HISTORY:   Past Medical History:  Diagnosis Date   CAD (coronary artery disease)    a. s/p NSTEMI, LHC 03/2020 dRCA 25%, m-LAD-1 95%-0% (DES), mLAD-2 50%, LVEDP moderately elevated, 27 mmHg.   Chronic diastolic heart failure (HCC)    a. LHC/echo 3/22 c/ elevated LVEDP, EF 50-55%, hypokinesis; b. Echo 10/22 EF 60-65%, mild LVH, borderline dilatation of ascending aorta, 39mm.   DDD (degenerative disc disease), lumbar    Diabetes mellitus without complication (HCC)    GERD (gastroesophageal reflux disease)    Hyperlipidemia    Hypertension    Hypothyroidism    Lumbar herniated disc    Major depressive disorder    Morbid obesity (HCC)     PAST SURGICAL HISTORY:   Past Surgical History:  Procedure Laterality Date   CORONARY STENT INTERVENTION N/A 03/28/2020   Procedure: CORONARY STENT INTERVENTION;  Surgeon: Corky CraftsVaranasi, Jayadeep S, MD;  Location: [facility] IN[facility]ASIVE [facility];  Service: Cardiovascular;  Laterality: N/A;   DILATION AND CURETTAGE OF UTERUS     INCISION AND DRAINAGE PERIRECTAL ABSCESS  06/21/2014   INCISION AND DRAINAGE PERIRECTAL ABSCESS Bilateral 06/21/2014   Procedure: IRRIGATION AND DEBRIDEMENT PERIRECTAL ABSCESS;  Surgeon: Abigail Miyamotoouglas Blackman, MD;  [facility];  Service:  General;  Laterality: Bilateral;   LEFT HEART CATH AND CORONARY ANGIOGRAPHY N/A 03/28/2020   Procedure: LEFT HEART CATH AND CORONARY ANGIOGRAPHY;  Surgeon: Corky Crafts, MD;  Location: [facility] INVASIVE CV LAB;  Service: Cardiovascular;  Laterality: N/A;    SOCIAL HISTORY:   Social History   Tobacco Use   Smoking status: Never   Smokeless tobacco: Never  Substance Use Topics   Alcohol use: No    FAMILY HISTORY:   Family History  Problem Relation Age  of Onset   Diabetes Mother    Diabetes Father    Heart disease Neg Hx     DRUG ALLERGIES:   Allergies  Allergen Reactions   Irbesartan Other (See Comments)    Dizziness, muscle pain   Clindamycin/Lincomycin Other (See Comments) and Nausea And Vomiting    Stomach pain    Latex Rash   Ultram [Tramadol] Rash    REVIEW OF SYSTEMS:   ROS As per history of present illness. All pertinent systems were reviewed above. Constitutional, HEENT, cardiovascular, respiratory, GI, GU, musculoskeletal, neuro, psychiatric, endocrine, integumentary and hematologic systems were reviewed and are otherwise negative/unremarkable except for positive findings mentioned above in the HPI.   MEDICATIONS AT HOME:   Prior to Admission medications   Medication Sig Start Date End Date Taking? Authorizing Provider  acetaminophen (TYLENOL) 500 MG tablet Take 1,000 mg by mouth every 6 (six) hours as needed for moderate pain or headache.    [provider]  aspirin 81 MG chewable tablet Chew 1 tablet (81 mg total) by mouth daily. 04/04/20   Lewie Chamber, MD  dapagliflozin propanediol (FARXIGA) 10 MG TABS tablet Take 1 tablet (10 mg total) by mouth daily before breakfast. 01/01/21   Romero Belling, MD  DULoxetine (CYMBALTA) 30 MG capsule Take 60 mg by mouth at bedtime. 09/21/20   [provider]  furosemide (LASIX) 80 MG tablet 1 AND 1/2 TABLET ONCE DAILY 03/28/21   Lewayne Bunting, MD  gabapentin (NEURONTIN) 300 MG capsule Take 600 mg by mouth in the morning, at noon, and at bedtime. Patient not taking: Reported on 03/28/2021 07/26/20   [provider]  gabapentin (NEURONTIN) 300 MG capsule Take 2 capsules by mouth 3 (three) times daily. 03/23/21   [provider]  insulin NPH-regular Human (NOVOLIN 70/30 RELION) (70-30) 100 UNIT/ML injection 155 units with breakfast, and 125 units with supper, and needles 1/day 02/27/21   Romero Belling, MD  irbesartan (AVAPRO) 150 MG tablet Take 1  tablet (150 mg total) by mouth daily. 03/28/21   Lewayne Bunting, MD  levothyroxine (SYNTHROID, LEVOTHROID) 50 MCG tablet Take 50 mcg by mouth daily. 04/15/14   [provider]  meclizine (ANTIVERT) 25 MG tablet Take 1-2 tablets (25-50 mg total) by mouth 3 (three) times daily as needed for dizziness. Patient not taking: Reported on 03/28/2021 04/21/20   Linwood Dibbles, MD  metFORMIN (GLUCOPHAGE-XR) 500 MG 24 hr tablet Take 4 tablets (2,000 mg total) by mouth daily. 01/01/21   Romero Belling, MD  metoprolol succinate (TOPROL XL) 50 MG 24 hr tablet Take 1 tablet (50 mg total) by mouth daily. Take with or immediately following a meal. MAY TAKE EXTRA  FOR PALPITATIONS 06/06/20 06/06/21  Ronney Asters, NP  nystatin (MYCOSTATIN/NYSTOP) powder Apply 1 application topically in the morning, at noon, in the evening, and at bedtime.    [provider]  nystatin cream (MYCOSTATIN) Apply 1 application topically in the morning, at noon, in the evening, and at  bedtime. Abdominal folds    [provider]  omeprazole (PRILOSEC) 40 MG capsule Take 40 mg by mouth 2 (two) times daily.    [provider]  ondansetron (ZOFRAN ODT) 8 MG disintegrating tablet Take 1 tablet (8 mg total) by mouth every 8 (eight) hours as needed for nausea or vomiting. Patient not taking: Reported on 03/28/2021 04/21/20   Linwood Dibbles, MD  potassium chloride (KLOR-CON) 10 MEQ tablet Take 2 tablets (20 mEq total) by mouth daily. 03/28/21   Lewayne Bunting, MD  rosuvastatin (CRESTOR) 20 MG tablet Take 1 tablet (20 mg total) by mouth every Monday, Wednesday, and Friday. Increase to daily as able to tolerate Patient not taking: Reported on 03/28/2021 02/02/21   Ronney Asters, NP  rosuvastatin (CRESTOR) 40 MG tablet Take 40 mg by mouth daily. 03/18/21   [provider]  spironolactone (ALDACTONE) 25 MG tablet Take 1 tablet by mouth once daily 12/07/20   Ronney Asters, NP      VITAL SIGNS:  Blood pressure  106/68, pulse 76, temperature 98 F (36.7 C), resp. rate 20, height 5\' 6"  (1.676 m), weight (!) 157.1 kg, last menstrual period 03/09/2021, SpO2 100 %.  PHYSICAL EXAMINATION:  Physical Exam  GENERAL:  51 y.o.-year-old obese Caucasian female patient lying in the bed with no acute distress.  EYES: Pupils equal, round, reactive to light and accommodation. No scleral icterus. Extraocular muscles intact.  HEENT: Head atraumatic, normocephalic. Oropharynx and nasopharynx clear.  NECK:  Supple, no jugular venous distention. No thyroid enlargement, no tenderness.  LUNGS: Normal breath sounds bilaterally, no wheezing, rales,rhonchi or crepitation. No use of accessory muscles of respiration.  CARDIOVASCULAR: Regular rate and rhythm, S1, S2 normal. No murmurs, rubs, or gallops.  ABDOMEN: Soft, nondistended, nontender. Bowel sounds present. No organomegaly or mass.  EXTREMITIES: No pedal edema, cyanosis, or clubbing.  NEUROLOGIC: Cranial nerves II through XII are intact. Muscle strength 5/5 in all extremities. Sensation intact. Gait not checked.  PSYCHIATRIC: The patient is alert and oriented x 3.  Normal affect and good eye contact. SKIN: No obvious rash, lesion, or ulcer.   LABORATORY PANEL:   CBC Recent Labs  Lab 04/06/21 1837  WBC 10.6*  HGB 12.0  HCT 37.5  PLT 210   ------------------------------------------------------------------------------------------------------------------  Chemistries  Recent Labs  Lab 04/06/21 1837  NA 139  K 4.3  CL 100  CO2 26  GLUCOSE 112*  BUN 37*  CREATININE 1.69*  CALCIUM 9.2   ------------------------------------------------------------------------------------------------------------------  Cardiac Enzymes No results for input(s): TROPONINI in the last 168 hours. ------------------------------------------------------------------------------------------------------------------  RADIOLOGY:  DG Chest 2 View  Result Date: 04/06/2021 CLINICAL  DATA:  Chest pain dizziness.  Shortness of breath. EXAM: CHEST - 2 VIEW COMPARISON:  04/14/2020 FINDINGS: There is are moderately decreased lung volumes worsened from prior. Cardiac silhouette again appears mildly enlarged. Mediastinal contours are grossly unremarkable. Mild bilateral interstitial thickening may be secondary to the low lung volumes. No focal airspace opacity. No definite pleural effusion. No pneumothorax. No acute skeletal abnormality. IMPRESSION: Mild bilateral interstitial thickening may be secondary to moderately decreased lung volumes. Cannot entirely exclude minimal interstitial pulmonary edema. Electronically Signed   By: 04/16/2020 M.D.   On: 04/06/2021 19:20      IMPRESSION AND PLAN:  Assessment and Plan: * AKI (acute kidney injury) (HCC) - She will be admitted to an observation medical telemetry bed. - We will continue hydration with IV normal saline. - We will follow her BMP. - We will  hold off nephrotoxins.  Hypotension - This likely estrogenic from ARB addition.  The patient apparently has not tolerated it. - She stopped her irbesartan. - We will hold off diuretics for now given her acute kidney injury. - We will closely monitor her BP while she is here.  Type 2 diabetes mellitus with peripheral neuropathy (HCC) - We will place on supplement coverage with NovoLog. - We will continue basal coverage with Novolin 70/30. - We will continue her [place]. - We will hold off her metformin given acute kidney injury. - We will continue her Neurontin.  CAD (coronary artery disease) - We will continue aspirin and Plavix. - We will continue beta-blocker therapy with Toprol-XL as well as statin therapy.  Hypothyroidism - We will continue her Synthroid.  Dyslipidemia - We will continue statin therapy.  Depression - We will continue Cymbalta.    DVT prophylaxis: Lovenox.  Advanced Care Planning:  Code Status: full code.  Family Communication:  The plan of  care was discussed in details with the patient (and family). I answered all questions. The patient agreed to proceed with the above mentioned plan. Further management will depend upon hospital course. Disposition Plan: Back to previous home environment Consults called: none.  All the records are reviewed and case discussed with ED provider.  Status is: Observation   I certify that at the time of admission, it is my clinical judgment that the patient will require hospital care extending less than 2 midnights.                            Dispo: The patient is from: Home              Anticipated d/c is to: Home              Patient currently is not medically stable to d/c.              Difficult to place patient: No  Hannah Beat M.D on 04/06/2021 at 11:26 PM  [facility]   From 7 PM-7 AM, contact night-coverage [email or web address]  CC: Primary care physician; Elizabeth Palau, FNP

## 2021-04-07 ENCOUNTER — Observation Stay (HOSPITAL_COMMUNITY): Payer: BC Managed Care – PPO

## 2021-04-07 DIAGNOSIS — N179 Acute kidney failure, unspecified: Secondary | ICD-10-CM | POA: Diagnosis not present

## 2021-04-07 LAB — HEPATIC FUNCTION PANEL
ALT: 15 U/L (ref 0–44)
AST: 14 U/L — ABNORMAL LOW (ref 15–41)
Albumin: 3.2 g/dL — ABNORMAL LOW (ref 3.5–5.0)
Alkaline Phosphatase: 62 U/L (ref 38–126)
Bilirubin, Direct: 0.2 mg/dL (ref 0.0–0.2)
Indirect Bilirubin: 0.8 mg/dL (ref 0.3–0.9)
Total Bilirubin: 1 mg/dL (ref 0.3–1.2)
Total Protein: 6.6 g/dL (ref 6.5–8.1)

## 2021-04-07 LAB — BASIC METABOLIC PANEL
Anion gap: 11 (ref 5–15)
BUN: 36 mg/dL — ABNORMAL HIGH (ref 6–20)
CO2: 26 mmol/L (ref 22–32)
Calcium: 9 mg/dL (ref 8.9–10.3)
Chloride: 102 mmol/L (ref 98–111)
Creatinine, Ser: 1.44 mg/dL — ABNORMAL HIGH (ref 0.44–1.00)
GFR, Estimated: 45 mL/min — ABNORMAL LOW (ref >60)
Glucose, Bld: 104 mg/dL — ABNORMAL HIGH (ref 70–99)
Potassium: 3.7 mmol/L (ref 3.5–5.1)
Sodium: 139 mmol/L (ref 135–145)

## 2021-04-07 LAB — GLUCOSE, CAPILLARY
Glucose-Capillary: 108 mg/dL — ABNORMAL HIGH (ref 70–99)
Glucose-Capillary: 143 mg/dL — ABNORMAL HIGH (ref 70–99)
Glucose-Capillary: 208 mg/dL — ABNORMAL HIGH (ref 70–99)
Glucose-Capillary: 148 mg/dL — ABNORMAL HIGH (ref 70–99)
Glucose-Capillary: 121 mg/dL — ABNORMAL HIGH (ref 70–99)

## 2021-04-07 LAB — CBC
HCT: 33.9 % — ABNORMAL LOW (ref 36.0–46.0)
Hemoglobin: 11 g/dL — ABNORMAL LOW (ref 12.0–15.0)
MCH: 26.9 pg (ref 26.0–34.0)
MCHC: 32.4 g/dL (ref 30.0–36.0)
MCV: 82.9 fL (ref 80.0–100.0)
Platelets: 198 10*3/uL (ref 150–400)
RBC: 4.09 MIL/uL (ref 3.87–5.11)
RDW: 16.1 % — ABNORMAL HIGH (ref 11.5–15.5)
WBC: 10.9 10*3/uL — ABNORMAL HIGH (ref 4.0–10.5)
nRBC: 0 % (ref 0.0–0.2)

## 2021-04-07 LAB — MAGNESIUM: Magnesium: 1.8 mg/dL (ref 1.7–2.4)

## 2021-04-07 LAB — LACTATE DEHYDROGENASE: LDH: 128 U/L (ref 98–192)

## 2021-04-07 LAB — LACTIC ACID, PLASMA: Lactic Acid, Venous: 2.5 mmol/L (ref 0.5–1.9)

## 2021-04-07 LAB — PHOSPHORUS: Phosphorus: 4.6 mg/dL (ref 2.5–4.6)

## 2021-04-07 LAB — CK: Total CK: 147 U/L (ref 38–234)

## 2021-04-07 LAB — HIV ANTIBODY (ROUTINE TESTING W REFLEX): HIV Screen 4th Generation wRfx: NONREACTIVE

## 2021-04-07 MED ORDER — INSULIN ASPART PROT & ASPART (70-30 MIX) 100 UNIT/ML ~~LOC~~ SUSP
125.0000 [IU] | Freq: Two times a day (BID) | SUBCUTANEOUS | Status: DC
  Administered 2021-04-07 – 2021-04-08 (×2): 125 [IU] via SUBCUTANEOUS
  Filled 2021-04-07: qty 10

## 2021-04-07 MED ORDER — FENTANYL CITRATE PF 50 MCG/ML IJ SOSY
25.0000 ug | PREFILLED_SYRINGE | INTRAMUSCULAR | Status: DC | PRN
  Administered 2021-04-07: 25 ug via INTRAVENOUS
  Filled 2021-04-07: qty 1

## 2021-04-07 MED ORDER — MORPHINE SULFATE (PF) 2 MG/ML IV SOLN: 2.0000 mg | INTRAVENOUS | Status: DC | PRN

## 2021-04-07 MED ORDER — ROSUVASTATIN CALCIUM 20 MG PO TABS
40.0000 mg | ORAL_TABLET | Freq: Every day | ORAL | Status: DC
  Administered 2021-04-07 – 2021-04-08 (×2): 40 mg via ORAL
  Filled 2021-04-07 (×2): qty 2

## 2021-04-07 NOTE — Progress Notes (Signed)
?PROGRESS NOTE ? ?[name]  ?DOB: 06/07/1970  ?PCP: Elizabeth PalauAnderson, Teresa, FNP ?WUJ:[id number]  ?DOA: 04/06/2021 ? LOS: 0 days  ?Hospital Day: 2 ? ?Brief narrative: ?[name] is a 51 y.o. female with PMH significant for morbid obesity, DM2, HTN, HLD, CAD/stents, chronic diastolic CHF, hypothyroidism, GERD, major depressive disorder on 2 L oxygen at home. ?Patient presented to the ED on 3/10 with complaints of acute onset lightheadedness and dizziness. ? ?On 3/1, she was seen by her cardiologist Dr. Jens Somrenshaw. Blood pressure was elevated and hence irbesartan was added.  Patient states that after she took the first dose of irbesartan on 3/6, she felt lightheaded, dizzy and headache.  She monitored her blood pressure and it was in 70s.  She did not take it again but she continued to have the same symptoms for next 3 to 4 days and hence presented to the ED.  She also mentions associated jaw pain and pain between her shoulders and throbbing headache.   ? ?In the ED, patient was afebrile, blood pressure 162/105, heart rate 76, oxygen 2 L by nasal cannula ?Labs with BUN/creatinine elevated to 37/1.69, lactic acid 3.3 WC count 10.6 ?EKG showed normal sinus rhythm with a rate of 75 with left anterior fascicular block. ?Chest x-ray bilateral interstitial thickening, unable to exclude mild interstitial pulmonary edema. ?Kept in observation under hospitalist service. ? ?Subjective: ?Patient was seen and examined this morning.  Pleasant morbidly obese middle-aged Caucasian female.  Sitting up in chair.  Not in distress.  On 2 L oxygen at baseline.  Continues to complain intermittent headache and pain between shoulders. ?Chart reviewed ?Remains hemodynamically stable ? ?Principal Problem: ?  AKI (acute kidney injury) (HCC) ?Active Problems: ?  Hypotension ?  Type 2 diabetes mellitus with peripheral neuropathy (HCC) ?  CAD (coronary artery disease) ?  Hypothyroidism ?  Dyslipidemia ?  Depression ?  ? ?Assessment and Plan ?AKI  (acute kidney injury)  ?-It seems patient's creatinine was less than 1 in December 2022.  Presented with creatinine elevated 1.69.  AKI likely because of few days of sustained low blood pressure.  Gradually improving with IV fluid.  1.44 today. ?-Diuretics and ARB on hold for now. ?-Continue to monitor on IV fluid. ?-Obtain renal ultrasound. ?Recent Labs  ?  04/14/20 ?0023 04/21/20 ?1544 06/06/20 ?1047 01/10/21 ?1445 04/06/21 ?1837 04/07/21 ?0158  ?BUN 21* 37* 24 19 37* 36*  ?CREATININE 1.27* 1.55* 1.10* 0.96 1.69* 1.44*  ? ?Muscle pain/soreness ?Hyperlipidemia ?-Patient states she was previously on Crestor 20 mg daily and it was increased to 40 mg daily 3 weeks ago by her primary care provider because of elevated lipid panel.  ?-Normal CK, AST, ALT, LDH -no suggestion of myopathy ?-Continue Crestor for now. ?-As needed Tylenol for muscle pain. ?Recent Labs  ?Lab 04/06/21 ?1837 04/07/21 ?0158  ?CKTOTAL  --  147  ?AST  --  14*  ?ALT  --  15  ?ALKPHOS  --  62  ?LDH  --  128  ?K 4.3 3.7  ?MG  --  1.8  ?PHOS  --  4.6  ?  ?CAD ?-Continue Toprol, aspirin, Plavix.  Statin on hold for now as above ? ?Chronic diastolic CHF ?Essential hypertension ?Chronic bilateral pedal edema ?-Last echo from October 2022 with EF 60 to 65%, mild LVH, normal right ventricular size and systolic function ?-Home meds include Toprol 50 mg daily, Lasix 80 mg daily, Aldactone 25 mg daily.  As mentioned above, she was recently added on irbesartan 150  mg daily which she stopped after getting lightheaded dizziness. ?-Continue Toprol.  Others on hold because of AKI.  Continue to monitor blood pressure.  IV hydralazine as needed. ?-Compression stockings suggested for pedal edema. ? ?Uncontrolled type 2 diabetes mellitus ?Peripheral neuropathy ?-A1c 9.2 on 01/2021 ?-Home meds include Novolin 70/30 twice a day 125 units, Farxiga 10 mg daily, metformin 1000 mg twice daily ?-Currently on Novolin 70/30 twice a day 125 units, Farxiga 10 mg daily.  Metformin  on hold ?-Blood sugar level less than 150s so far.  Continue to monitor ?-Continue Neurontin for neuropathy ?Recent Labs  ?Lab 04/06/21 ?2255 04/06/21 ?2354 04/07/21 ?1610 04/07/21 ?9604  ?GLUCAP 81 112* 108* 143*  ? ?Hypothyroidism ?-Synthroid ? ?GERD ?-PPI ? ?Depression ?-Cymbalta 60 mg at bedtime ? ?Morbid obesity  ?-Body mass index is 55.9 kg/m?Marland Kitchen Patient has been advised to make an attempt to improve diet and exercise patterns to aid in weight loss. ? ?OSA ?-Does not use CPAP. ? ?Chronic hypoxic respiratory failure ?-Continue 2 L oxygen by nasal cannula ? ?Goals of care ?  Code Status: Full Code  ? ? ?Mobility: Encourage ambulation ? ?Nutritional status:  ?Body mass index is 55.9 kg/m?.  ?  ?  ? ? ? ? ?Diet:  ?Diet Order   ? ?       ?  Diet Heart Room service appropriate? Yes; Fluid consistency: Thin  Diet effective now       ?  ? ?  ?  ? ?  ? ? ?DVT prophylaxis: Subcu Lovenox ? ?  ?Antimicrobials: None ?Fluid: NS at 100 mill per hour ?Consultants: None ?Family Communication: Family not at bedside ? ?Status is: Observation ? ?Continue in-hospital care because: Needs IV hydration, further creatinine, blood pressure monitoring ?Level of care: Telemetry Medical  ? ?Dispo: The patient is from: Home ?             Anticipated d/c is to: Hopefully home in 1 to 2 days ?             Patient currently is not medically stable to d/c. ?  Difficult to place patient No ? ? ? ? ?Infusions:  ? sodium chloride 100 mL/hr at 04/07/21 0344  ? ? ?Scheduled Meds: ? aspirin  81 mg Oral Daily  ? dapagliflozin propanediol  10 mg Oral QAC breakfast  ? DULoxetine  60 mg Oral QHS  ? enoxaparin (LOVENOX) injection  80 mg Subcutaneous Q24H  ? gabapentin  600 mg Oral TID  ? insulin aspart protamine- aspart  125 Units Subcutaneous BID WC  ? levothyroxine  50 mcg Oral Daily  ? metoprolol succinate  50 mg Oral Daily  ? nystatin  1 application. Topical QID  ? nystatin cream  1 application. Topical QID  ? pantoprazole  40 mg Oral Daily  ?  rosuvastatin  40 mg Oral QHS  ? ? ?PRN meds: ?acetaminophen **OR** acetaminophen, magnesium hydroxide, meclizine, ondansetron **OR** ondansetron (ZOFRAN) IV, traZODone  ? ?Antimicrobials: ?Anti-infectives (From admission, onward)  ? ? None  ? ?  ? ? ?Objective: ?Vitals:  ? 04/07/21 0507 04/07/21 0831  ?BP: 125/70 (!) 111/55  ?Pulse: 77 73  ?Resp: 19 18  ?Temp: 98.2 ?F (36.8 ?C) 98.4 ?F (36.9 ?C)  ?SpO2: 96% 99%  ? ? ?Intake/Output Summary (Last 24 hours) at 04/07/2021 1145 ?Last data filed at 04/07/2021 0854 ?Gross per 24 hour  ?Intake 460 ml  ?Output --  ?Net 460 ml  ? ?Filed Weights  ? 04/06/21  1833  ?Weight: (!) 157.1 kg  ? ?Weight change:  ?Body mass index is 55.9 kg/m?.  ? ?Physical Exam: ?General exam: Pleasant, morbidly obese middle-aged Caucasian female.  Not in physical distress ?Skin: No rashes, lesions or ulcers. ?HEENT: Atraumatic, normocephalic, no obvious bleeding ?Lungs: Clear to auscultate bilaterally ?CVS: Regular rate and rhythm, no murmur ?GI/Abd soft, nontender, distended with obesity, bowel sound present ?CNS: Alert, awake, oriented x3 ?Psychiatry: Mood appropriate ?Extremities: Pedal edema 1+ bilaterally. ? ?Data Review: I have personally reviewed the laboratory data and studies available. ? ?F/u labs ordered ?Unresulted Labs (From admission, onward)  ? ?  Start     Ordered  ? 04/08/21 0500  Basic metabolic panel  Daily,   R     ?Question:  Specimen collection method  Answer:  Lab=Lab collect  ? 04/07/21 0834  ? 04/08/21 0500  CBC with Differential/Platelet  Daily,   R     ?Question:  Specimen collection method  Answer:  Lab=Lab collect  ? 04/07/21 0834  ? 04/08/21 0500  Lactic acid, plasma  Tomorrow morning,   R       ?Question:  Specimen collection method  Answer:  Lab=Lab collect  ? 04/07/21 1050  ? ?  ?  ? ?  ? ? ?Signed, ?Lorin Glass, MD ?[facility] ?04/07/2021 ? ? ? ? ? ? ? ? ? ? ?  ?

## 2021-04-08 DIAGNOSIS — N179 Acute kidney failure, unspecified: Secondary | ICD-10-CM | POA: Diagnosis present

## 2021-04-08 LAB — CBC WITH DIFFERENTIAL/PLATELET
Abs Immature Granulocytes: 0.04 10*3/uL (ref 0.00–0.07)
Basophils Absolute: 0 10*3/uL (ref 0.0–0.1)
Basophils Relative: 0 %
Eosinophils Absolute: 0.2 10*3/uL (ref 0.0–0.5)
Eosinophils Relative: 3 %
HCT: 34.6 % — ABNORMAL LOW (ref 36.0–46.0)
Hemoglobin: 11.3 g/dL — ABNORMAL LOW (ref 12.0–15.0)
Immature Granulocytes: 0 %
Lymphocytes Relative: 31 %
Lymphs Abs: 2.8 10*3/uL (ref 0.7–4.0)
MCH: 27.5 pg (ref 26.0–34.0)
MCHC: 32.7 g/dL (ref 30.0–36.0)
MCV: 84.2 fL (ref 80.0–100.0)
Monocytes Absolute: 0.7 10*3/uL (ref 0.1–1.0)
Monocytes Relative: 8 %
Neutro Abs: 5.2 10*3/uL (ref 1.7–7.7)
Neutrophils Relative %: 58 %
Platelets: 191 10*3/uL (ref 150–400)
RBC: 4.11 MIL/uL (ref 3.87–5.11)
RDW: 16.2 % — ABNORMAL HIGH (ref 11.5–15.5)
WBC: 9 10*3/uL (ref 4.0–10.5)
nRBC: 0 % (ref 0.0–0.2)

## 2021-04-08 LAB — BASIC METABOLIC PANEL
Anion gap: 6 (ref 5–15)
BUN: 27 mg/dL — ABNORMAL HIGH (ref 6–20)
CO2: 30 mmol/L (ref 22–32)
Calcium: 8.6 mg/dL — ABNORMAL LOW (ref 8.9–10.3)
Chloride: 105 mmol/L (ref 98–111)
Creatinine, Ser: 1.41 mg/dL — ABNORMAL HIGH (ref 0.44–1.00)
GFR, Estimated: 46 mL/min — ABNORMAL LOW (ref >60)
Glucose, Bld: 121 mg/dL — ABNORMAL HIGH (ref 70–99)
Potassium: 4.5 mmol/L (ref 3.5–5.1)
Sodium: 141 mmol/L (ref 135–145)

## 2021-04-08 LAB — GLUCOSE, CAPILLARY
Glucose-Capillary: 111 mg/dL — ABNORMAL HIGH (ref 70–99)
Glucose-Capillary: 120 mg/dL — ABNORMAL HIGH (ref 70–99)
Glucose-Capillary: 136 mg/dL — ABNORMAL HIGH (ref 70–99)
Glucose-Capillary: 145 mg/dL — ABNORMAL HIGH (ref 70–99)
Glucose-Capillary: 187 mg/dL — ABNORMAL HIGH (ref 70–99)
Glucose-Capillary: 190 mg/dL — ABNORMAL HIGH (ref 70–99)

## 2021-04-08 LAB — LACTIC ACID, PLASMA: Lactic Acid, Venous: 1 mmol/L (ref 0.5–1.9)

## 2021-04-08 MED ORDER — OXYCODONE-ACETAMINOPHEN 5-325 MG PO TABS
1.0000 | ORAL_TABLET | Freq: Four times a day (QID) | ORAL | Status: DC | PRN
Start: 2021-04-08 — End: 2021-04-09
  Administered 2021-04-08 – 2021-04-09 (×2): 1 via ORAL
  Filled 2021-04-08 (×2): qty 1

## 2021-04-08 MED ORDER — INSULIN ASPART PROT & ASPART (70-30 MIX) 100 UNIT/ML ~~LOC~~ SUSP
135.0000 [IU] | Freq: Two times a day (BID) | SUBCUTANEOUS | Status: DC
  Administered 2021-04-08: 135 [IU] via SUBCUTANEOUS
  Filled 2021-04-08: qty 10

## 2021-04-08 MED ORDER — LIDOCAINE 5 % EX PTCH
1.0000 | MEDICATED_PATCH | CUTANEOUS | Status: DC
  Administered 2021-04-08 (×2): 1 via TRANSDERMAL
  Filled 2021-04-08 (×2): qty 1

## 2021-04-08 NOTE — Progress Notes (Signed)
Set pt up on CPAP auto, with nasal mask. Pt unable to get comfortable, so wants to wait until all meds are given, and then try CPAP again. Pt does not use CPAP at home.  ?

## 2021-04-08 NOTE — Progress Notes (Signed)
?PROGRESS NOTE ? ?[name]  ?DOB: [birth year]  ?PCP: Elizabeth Palau, FNP ?PZW:[id number]  ?DOA: 04/06/2021 ? LOS: 0 days  ?Hospital Day: 3 ? ?Brief narrative: ?[name] is a 51 y.o. female with PMH significant for morbid obesity, PCOS, DM2, HTN, HLD, CAD/stents, chronic diastolic CHF, hypothyroidism, GERD, major depressive disorder on 2 L oxygen at home. ?Patient presented to the ED on 3/10 with complaints of acute onset lightheadedness and dizziness. ? ?On 3/1, she was seen by her cardiologist Dr. Jens Som. Blood pressure was elevated and hence irbesartan was added.  Patient states that after she took the first dose of irbesartan on 3/6, she felt lightheaded, dizzy and headache.  She monitored her blood pressure and it was in 70s.  She did not take it again but she continued to have the same symptoms for next 3 to 4 days and hence presented to the ED.  She also mentions associated jaw pain and pain between her shoulders and throbbing headache.   ? ?In the ED, patient was afebrile, blood pressure 162/105, heart rate 76, oxygen 2 L by nasal cannula ?Labs with BUN/creatinine elevated to 37/1.69, lactic acid 3.3 WC count 10.6 ?EKG showed normal sinus rhythm with a rate of 75 with left anterior fascicular block. ?Chest x-ray bilateral interstitial thickening, unable to exclude mild interstitial pulmonary edema. ?Kept in observation under hospitalist service. ? ?Subjective: ?Patient was seen and examined this morning.   ?Sitting up in chair.  Complains of back pain because of uncomfortable bed.  Remains on IV fluid and on 2 L  ? ?Principal Problem: ?  AKI (acute kidney injury) (HCC) ?Active Problems: ?  Hypotension ?  Type 2 diabetes mellitus with peripheral neuropathy (HCC) ?  CAD (coronary artery disease) ?  Hypothyroidism ?  Dyslipidemia ?  Depression ?  Acute kidney injury (HCC) ?  ? ?Assessment and Plan ?AKI (acute kidney injury)  ?-It seems patient's creatinine was less than 1 in December 2022.   Presented with creatinine elevated 1.69.  AKI likely because of few days of sustained low blood pressure.  Gradually improving with IV fluid.  1.41 today.  Continue IV fluid. ?-Diuretics and ARB on hold for now. ?-renal ultrasound unremarkable. ?-Continue to monitor creatinine. ?Recent Labs  ?  04/14/20 ?0023 04/21/20 ?1544 06/06/20 ?1047 01/10/21 ?1445 04/06/21 ?1837 04/07/21 ?0158 04/08/21 ?0128  ?BUN 21* 37* 24 19 37* 36* 27*  ?CREATININE 1.27* 1.55* 1.10* 0.96 1.69* 1.44* 1.41*  ? ?Muscle pain/soreness ?Hyperlipidemia ?-Patient states she was previously on Crestor 20 mg daily and it was increased to 40 mg daily 3 weeks ago by her primary care provider because of elevated lipid panel.  ?-Normal CK, AST, ALT, LDH -no suggestion of myopathy ?-Continue Crestor for now. ?-As needed Tylenol for muscle pain. ?Recent Labs  ?Lab 04/06/21 ?1837 04/07/21 ?0158 04/08/21 ?0128  ?CKTOTAL  --  147  --   ?AST  --  14*  --   ?ALT  --  15  --   ?ALKPHOS  --  62  --   ?LDH  --  128  --   ?K 4.3 3.7 4.5  ?MG  --  1.8  --   ?PHOS  --  4.6  --   ?  ?CAD ?-Continue Toprol, aspirin, Plavix.  Statin on hold for now as above ? ?Chronic diastolic CHF ?Essential hypertension ?Chronic bilateral pedal edema ?-Last echo from October 2022 with EF 60 to 65%, mild LVH, normal right ventricular size and systolic function ?-Home meds  include Toprol 50 mg daily, Lasix 80 mg daily, Aldactone 25 mg daily.  As mentioned above, she was recently added on irbesartan 150 mg daily as an outpatient which she stopped after getting lightheaded dizziness with a single dose ?-Continue Toprol.  Others on hold because of AKI.  Blood pressure in low normal range. Continue to monitor blood pressure. IV hydralazine as needed. ?-Compression stockings on for pedal edema. ? ?Uncontrolled type 2 diabetes mellitus ?Peripheral neuropathy ?-A1c 9.2 on 01/2021 ?-Currently on Novolin 70/30 twice a day 125 units, Farxiga 10 mg daily.  Metformin 1000 mg twice daily on  hold ?-Blood sugar level rising up.  Increase Novolin dose to 135 mg twice daily-Continue Neurontin for neuropathy ?Recent Labs  ?Lab 04/07/21 ?2019 04/08/21 ?2536 04/08/21 ?0503 04/08/21 ?6440 04/08/21 ?1117  ?GLUCAP 121* 120* 111* 145* 190*  ? ?PCOS/hirsutism ?-On metformin 1000 mg twice daily ? ?Hypothyroidism ?-Synthroid ? ?GERD ?-PPI ? ?Depression ?-Cymbalta 60 mg at bedtime ? ?Morbid obesity  ?-Body mass index is 55.9 kg/m?Marland Kitchen Patient has been advised to make an attempt to improve diet and exercise patterns to aid in weight loss. ? ?OSA ?-She has OSA symptoms.  Has not had sleep study done.  Wants to try CPAP in the hospital. ?-Referral to sleep center at discharge. ? ?Chronic hypoxic respiratory failure ?-Continue 2 L oxygen by nasal cannula ? ?Goals of care ?  Code Status: Full Code  ? ? ?Mobility: Encourage ambulation ? ?Nutritional status:  ?Body mass index is 55.9 kg/m?.  ?  ?  ? ? ? ? ?Diet:  ?Diet Order   ? ?       ?  Diet Heart Room service appropriate? Yes; Fluid consistency: Thin  Diet effective now       ?  ? ?  ?  ? ?  ? ? ?DVT prophylaxis: Subcu Lovenox ? ?  ?Antimicrobials: None ?Fluid: NS at 100 mill per hour ?Consultants: None ?Family Communication: Family not at bedside ? ?Status is: Observation ? ?Continue in-hospital care because: Needs IV hydration, further creatinine, blood pressure monitoring ?Level of care: Telemetry Medical  ? ?Dispo: The patient is from: Home ?             Anticipated d/c is to: Hopefully home in 1 to 2 days ?             Patient currently is not medically stable to d/c. ?  Difficult to place patient No ? ? ? ? ?Infusions:  ? sodium chloride 100 mL/hr at 04/08/21 1149  ? ? ?Scheduled Meds: ? aspirin  81 mg Oral Daily  ? dapagliflozin propanediol  10 mg Oral QAC breakfast  ? DULoxetine  60 mg Oral QHS  ? enoxaparin (LOVENOX) injection  80 mg Subcutaneous Q24H  ? gabapentin  600 mg Oral TID  ? insulin aspart protamine- aspart  135 Units Subcutaneous BID WC  ? levothyroxine   50 mcg Oral Daily  ? lidocaine  1 patch Transdermal Q24H  ? metoprolol succinate  50 mg Oral Daily  ? nystatin  1 application. Topical QID  ? nystatin cream  1 application. Topical QID  ? pantoprazole  40 mg Oral Daily  ? rosuvastatin  40 mg Oral QHS  ? ? ?PRN meds: ?acetaminophen **OR** acetaminophen, magnesium hydroxide, meclizine, ondansetron **OR** ondansetron (ZOFRAN) IV, oxyCODONE-acetaminophen, traZODone  ? ?Antimicrobials: ?Anti-infectives (From admission, onward)  ? ? None  ? ?  ? ? ?Objective: ?Vitals:  ? 04/08/21 0311 04/08/21 0833  ?BP: 125/69 Marland Kitchen)  110/58  ?Pulse: 75 65  ?Resp: 19 18  ?Temp: 97.8 ?F (36.6 ?C) (!) 97.4 ?F (36.3 ?C)  ?SpO2: 98% 100%  ? ? ?Intake/Output Summary (Last 24 hours) at 04/08/2021 1454 ?Last data filed at 04/08/2021 [id number] ?Gross per 24 hour  ?Intake 1717.6 ml  ?Output 1500 ml  ?Net 217.6 ml  ? ?Filed Weights  ? 04/06/21 1833  ?Weight: (!) 157.1 kg  ? ?Weight change:  ?Body mass index is 55.9 kg/m?.  ? ?Physical Exam: ?General exam: Pleasant, morbidly obese middle-aged Caucasian female.  Not in physical distress. ?Skin: No rashes, lesions or ulcers. ?HEENT: Atraumatic, normocephalic, no obvious bleeding ?Lungs: Clear to auscultate bilaterally ?CVS: Regular rate and rhythm, no murmur ?GI/Abd soft, nontender, distended with obesity, bowel sound present ?CNS: Alert, awake, oriented x3 ?Psychiatry: Mood appropriate ?Extremities: Bilateral lower extremity compression with Ace wrap. ? ?Data Review: I have personally reviewed the laboratory data and studies available. ? ?F/u labs ordered ?Unresulted Labs (From admission, onward)  ? ?  Start     Ordered  ? 04/08/21 0500  Basic metabolic panel  Daily,   R     ?Question:  Specimen collection method  Answer:  Lab=Lab collect  ? 04/07/21 0834  ? 04/08/21 0500  CBC with Differential/Platelet  Daily,   R     ?Question:  Specimen collection method  Answer:  Lab=Lab collect  ? 04/07/21 0834  ? ?  ?  ? ?  ? ? ?Signed, ?Lorin GlassBinaya Maximilliano Kersh, MD ?[facility] ?04/08/2021 ? ? ? ? ? ? ? ? ? ? ?  ?

## 2021-04-09 LAB — GLUCOSE, CAPILLARY
Glucose-Capillary: 68 mg/dL — ABNORMAL LOW (ref 70–99)
Glucose-Capillary: 93 mg/dL (ref 70–99)
Glucose-Capillary: 64 mg/dL — ABNORMAL LOW (ref 70–99)
Glucose-Capillary: 79 mg/dL (ref 70–99)

## 2021-04-09 LAB — BASIC METABOLIC PANEL
Anion gap: 7 (ref 5–15)
BUN: 22 mg/dL — ABNORMAL HIGH (ref 6–20)
CO2: 26 mmol/L (ref 22–32)
Calcium: 8.5 mg/dL — ABNORMAL LOW (ref 8.9–10.3)
Chloride: 108 mmol/L (ref 98–111)
Creatinine, Ser: 1.2 mg/dL — ABNORMAL HIGH (ref 0.44–1.00)
GFR, Estimated: 55 mL/min — ABNORMAL LOW (ref >60)
Glucose, Bld: 75 mg/dL (ref 70–99)
Potassium: 4 mmol/L (ref 3.5–5.1)
Sodium: 141 mmol/L (ref 135–145)

## 2021-04-09 LAB — CBC WITH DIFFERENTIAL/PLATELET
Abs Immature Granulocytes: 0.05 10*3/uL (ref 0.00–0.07)
Basophils Absolute: 0 10*3/uL (ref 0.0–0.1)
Basophils Relative: 0 %
Eosinophils Absolute: 0.3 10*3/uL (ref 0.0–0.5)
Eosinophils Relative: 3 %
HCT: 33.6 % — ABNORMAL LOW (ref 36.0–46.0)
Hemoglobin: 10.8 g/dL — ABNORMAL LOW (ref 12.0–15.0)
Immature Granulocytes: 1 %
Lymphocytes Relative: 24 %
Lymphs Abs: 2.3 10*3/uL (ref 0.7–4.0)
MCH: 27.2 pg (ref 26.0–34.0)
MCHC: 32.1 g/dL (ref 30.0–36.0)
MCV: 84.6 fL (ref 80.0–100.0)
Monocytes Absolute: 0.7 10*3/uL (ref 0.1–1.0)
Monocytes Relative: 7 %
Neutro Abs: 6.2 10*3/uL (ref 1.7–7.7)
Neutrophils Relative %: 65 %
Platelets: 187 10*3/uL (ref 150–400)
RBC: 3.97 MIL/uL (ref 3.87–5.11)
RDW: 16.1 % — ABNORMAL HIGH (ref 11.5–15.5)
WBC: 9.5 10*3/uL (ref 4.0–10.5)
nRBC: 0 % (ref 0.0–0.2)

## 2021-04-09 MED ORDER — FUROSEMIDE 40 MG PO TABS: 40.0000 mg | ORAL_TABLET | Freq: Every day | ORAL | Status: DC

## 2021-04-09 MED ORDER — OXYCODONE-ACETAMINOPHEN 5-325 MG PO TABS: 1.0000 | ORAL_TABLET | Freq: Four times a day (QID) | ORAL | 0 refills | Status: AC | PRN

## 2021-04-09 MED ORDER — LIDOCAINE 4 % EX PTCH: 1.0000 | MEDICATED_PATCH | Freq: Every day | CUTANEOUS | 0 refills | Status: AC | PRN

## 2021-04-09 NOTE — Discharge Summary (Signed)
Physician Discharge Summary  [name] JXB:[id number] DOB: 11/13/1970 DOA: 04/06/2021  PCP: Elizabeth Palau, FNP  Admit date: 04/06/2021 Discharge date: 04/09/2021  Admitted From: Home Discharge disposition: Home  Recommendations at discharge:  Post discharge, continue Toprol.  Resume Lasix at half dose of 40 mg daily.  Aldactone remains on hold.  I have advised patient to continue to monitor her blood pressure, pedal edema and shortness of breath at home.  Blood pressure is over 140 systolic, patient will gradually resume the previous regimen.  Irbesartan has been stopped.  Patient states he has a scheduled blood draw in the next few days from her cardiologist. Follow-up with sleep center for sleep studies.  Referral given.  Brief narrative: [name] is a 51 y.o. female with PMH significant for morbid obesity, PCOS, DM2, HTN, HLD, CAD/stents, chronic diastolic CHF, hypothyroidism, GERD, major depressive disorder on 2 L oxygen at home. Patient presented to the ED on 3/10 with complaints of acute onset lightheadedness and dizziness.  On 3/1, she was seen by her cardiologist Dr. Jens Som. Blood pressure was elevated and hence irbesartan was added.  Patient states that after she took the first dose of irbesartan on 3/6, she felt lightheaded, dizzy and headache.  She monitored her blood pressure and it was in 70s.  She did not take it again but she continued to have the same symptoms for next 3 to 4 days and hence presented to the ED.  She also mentions associated jaw pain and pain between her shoulders and throbbing headache.    In the ED, patient was afebrile, blood pressure 162/105, heart rate 76, oxygen 2 L by nasal cannula Labs with BUN/creatinine elevated to 37/1.69, lactic acid 3.3 WC count 10.6 EKG showed normal sinus rhythm with a rate of 75 with left anterior fascicular block. Chest x-ray bilateral interstitial thickening, unable to exclude mild interstitial pulmonary  edema. Kept in observation under hospitalist service.  Subjective: Patient was seen and examined this morning.   Sitting up in chair.  Feels better.  She states last night was the first time she slept after several days after getting some pain medicine.  Principal Problem:   AKI (acute kidney injury) (HCC) Active Problems:   Hypotension   Type 2 diabetes mellitus with peripheral neuropathy (HCC)   CAD (coronary artery disease)   Hypothyroidism   Dyslipidemia   Depression   Acute kidney injury (HCC)    Assessment and Plan AKI (acute kidney injury)  -It seems patient's creatinine was less than 1 in December 2022.  Presented with creatinine elevated 1.69.  AKI likely because of few days of sustained low blood pressure.  Gradually improving with IV fluid.  1.2 today.  -renal ultrasound unremarkable. Recent Labs    04/14/20 0023 04/21/20 1544 06/06/20 1047 01/10/21 1445 04/06/21 1837 04/07/21 0158 04/08/21 0128 04/09/21 0233  BUN 21* 37* 24 19 37* 36* 27* 22*  CREATININE 1.27* 1.55* 1.10* 0.96 1.69* 1.44* 1.41* 1.20*   Muscle pain/soreness Hyperlipidemia -Patient states she was previously on Crestor 20 mg daily and it was increased to 40 mg daily 3 weeks ago by her primary care provider because of elevated lipid panel.  -Normal CK, AST, ALT, LDH -no suggestion of myopathy -Continue Crestor for now. -As needed Tylenol for muscle pain. -patient feels better with as needed Percocet.  Wants to try that for few days at home.  I gave her a limited supply. Recent Labs  Lab 04/06/21 1837 04/07/21 0158 04/08/21 0128 04/09/21 1308  CKTOTAL  --  147  --   --   AST  --  14*  --   --   ALT  --  15  --   --   ALKPHOS  --  62  --   --   LDH  --  128  --   --   K 4.3 3.7 4.5 4.0  MG  --  1.8  --   --   PHOS  --  4.6  --   --     CAD -Continue Toprol, aspirin, Plavix and statin.  Chronic diastolic CHF Essential hypertension Chronic bilateral pedal edema -Last echo from  October 2022 with EF 60 to 65%, mild LVH, normal right ventricular size and systolic function -Home meds include Toprol 50 mg daily, Lasix 80 mg daily, Aldactone 25 mg daily.  As mentioned above, she was recently added on irbesartan 150 mg daily as an outpatient which she stopped after getting lightheaded dizziness with a single dose -While in the hospital, Toprol was continued.  Lasix and Aldactone were held.  Creatinine improving.  Blood pressure gradually rising.  Post discharge, continue Toprol.  Resume Lasix at half dose of 40 mg daily.  Aldactone remains on hold.  I have advised patient to continue to monitor her blood pressure, pedal edema and shortness of breath at home.  Blood pressure is over 140 systolic, patient will gradually resume the previous regimen.  Irbesartan has been stopped.  Patient states he has a scheduled blood draw in the next few days from her cardiologist. -Compression stockings for pedal edema to continue at home.  Uncontrolled type 2 diabetes mellitus Peripheral neuropathy -A1c 9.2 on 01/2021 -Continue previous home regimen with Novolin 70/30 twice a day, Farxiga and metformin.    PCOS/hirsutism -On metformin 1000 mg twice daily.  Hypothyroidism -Synthroid  GERD -PPI  Depression -Cymbalta 60 mg at bedtime.  Morbid obesity  -Body mass index is 55.9 kg/m. Patient has been advised to make an attempt to improve diet and exercise patterns to aid in weight loss.  OSA -She has OSA symptoms.  Has not had sleep study done. -Referral for sleep center given at discharge.  Chronic hypoxic respiratory failure -Continue 2 L oxygen by nasal cannula  Goals of care   Code Status: Full Code      Wounds:  - Incision (Closed) 06/21/14 Perineum (Active)  Date First Assessed/Time First Assessed: 06/21/14 2100   Location: Perineum    Assessments 06/21/2014  9:28 PM 06/24/2014  8:44 AM  Dressing Type Abdominal pads Abdominal pads  Dressing Intact;New drainage  Clean;Dry;Intact  Dressing Change Frequency -- Twice a day  Site / Wound Assessment -- Dressing in place / Unable to assess  Drainage Amount Scant --  Drainage Description Serosanguineous --     No Linked orders to display     Wound / Incision (Open or Dehisced) 03/30/20 (MASD) Moisture Associated Skin Damage Abdomen Right;Left;Lower (Active)  Date First Assessed/Time First Assessed: 03/30/20 0030   Wound Type: (MASD) Moisture Associated Skin Damage  Location: Abdomen  Location Orientation: Right;Left;Lower  Present on Admission: Yes    Assessments 03/30/2020 12:10 AM 04/02/2020  9:00 PM  Dressing Type -- Silver dressings  Dressing Status None None  Site / Wound Assessment Red --  Treatment Cleansed;Other (Comment) --     No Linked orders to display     Wound / Incision (Open or Dehisced) 04/06/21 (MASD) Moisture Associated Skin Damage Pelvis Anterior;Left;Right (Active)  Date First Assessed/Time  First Assessed: 04/06/21 2359   Wound Type: (MASD) Moisture Associated Skin Damage  Location: Pelvis  Location Orientation: Anterior;Left;Right  Present on Admission: Yes    Assessments 04/07/2021  3:00 AM 04/08/2021  8:10 PM  Dressing Type Other (Comment) None  Site / Wound Assessment Other (Comment) --     No Linked orders to display    Discharge Exam:   Vitals:   04/08/21 1506 04/08/21 2119 04/09/21 0605 04/09/21 0818  BP: 100/61 (!) 144/65 122/64 (!) 93/51  Pulse: (!) 59 61 70 70  Resp: 18 18 18 18   Temp: 97.7 F (36.5 C) 97.9 F (36.6 C) 98.4 F (36.9 C) 97.6 F (36.4 C)  TempSrc:  Oral Oral   SpO2: 100% 100% 99% 99%  Weight:      Height:        Body mass index is 55.9 kg/m.  General exam: Pleasant, morbidly obese middle-aged Caucasian female.  Not in physical distress. Skin: No rashes, lesions or ulcers. HEENT: Atraumatic, normocephalic, no obvious bleeding Lungs: Clear to auscultation bilaterally CVS: Regular rate and rhythm, no murmur GI/Abd soft, nontender, distended  with obesity, bowel sound present CNS: Alert, awake, oriented x3 Psychiatry: Mood appropriate Extremities: Bilateral lower extremity compression with Ace wrap.  Follow ups:    Follow-up Information     , FNP Follow up.   Specialty: Nurse Practitioner Contact information: [address] [place] [phone]         [phone], MD .   Specialty: Cardiology Contact information: [address] [place] [address] [place] [phone]                 Discharge Instructions:   Discharge Instructions     Ambulatory referral to Sleep Studies   Complete by: As directed    Call MD for:  difficulty breathing, headache or visual disturbances   Complete by: As directed    Call MD for:  extreme fatigue   Complete by: As directed    Call MD for:  hives   Complete by: As directed    Call MD for:  persistant dizziness or light-headedness   Complete by: As directed    Call MD for:  persistant nausea and vomiting   Complete by: As directed    Call MD for:  severe uncontrolled pain   Complete by: As directed    Call MD for:  temperature >100.4   Complete by: As directed    Diet - low sodium heart healthy   Complete by: As directed    Diet Carb Modified   Complete by: As directed    Discharge instructions   Complete by: As directed     Post discharge, continue Toprol.  Resume Lasix at half dose of 40 mg daily.  Aldactone remains on hold.  I have advised patient to continue to monitor her blood pressure, pedal edema and shortness of breath at home.  Blood pressure is over 140 systolic, patient will gradually resume the previous regimen.  Irbesartan has been stopped.  Patient states he has a scheduled blood draw in the next few days from her cardiologist.  Follow-up with sleep center for sleep studies.  Referral given.  Discharge instructions for diabetes mellitus: Check blood sugar 3 times a day and bedtime at  home. If blood sugar running above 200 or less than 70 please call your MD to adjust insulin. If you notice signs and symptoms of hypoglycemia (low blood sugar)  like jitteriness, confusion, thirst, tremor and sweating, please check blood sugar, drink sugary drink/biscuits/sweets to increase sugar level and call MD or return to ER.    Discharge instructions for CHF Check weight daily -preferably same time every day. Restrict fluid intake to 1200 ml daily Restrict salt intake to less than 2 g daily. Call MD if you have one of the following symptoms 1) 3 pound weight gain in 24 hours or 5 pounds in 1 week  2) swelling in the hands, feet or stomach  3) progressive shortness of breath 4) if you have to sleep on extra pillows at night in order to breathe     General discharge instructions: Follow with Primary MD Elizabeth Palau, FNP in 7 days  Please request your PCP  to go over your hospital tests, procedures, radiology results at the follow up. Please get your medicines reviewed and adjusted.  Your PCP may decide to repeat certain labs or tests as needed. Do not drive, operate heavy machinery, perform activities at heights, swimming or participation in water activities or provide baby sitting services if your were admitted for syncope or siezures until you have seen by Primary MD or a Neurologist and advised to do so again. [place] Controlled Substance Reporting System database was reviewed. Do not drive, operate heavy machinery, perform activities at heights, swim, participate in water activities or provide baby-sitting services while on medications for pain, sleep and mood until your outpatient physician has reevaluated you and advised to do so again.  You are strongly recommended to comply with the dose, frequency and duration of prescribed medications. Activity: As tolerated with Full fall precautions use walker/cane & assistance as needed Avoid using any recreational substances like  cigarette, tobacco, alcohol, or non-prescribed drug. If you experience worsening of your admission symptoms, develop shortness of breath, life threatening emergency, suicidal or homicidal thoughts you must seek medical attention immediately by calling 911 or calling your MD immediately  if symptoms less severe. You must read complete instructions/literature along with all the possible adverse reactions/side effects for all the medicines you take and that have been prescribed to you. Take any new medicine only after you have completely understood and accepted all the possible adverse reactions/side effects.  Wear Seat belts while driving. You were cared for by a hospitalist during your hospital stay. If you have any questions about your discharge medications or the care you received while you were in the hospital after you are discharged, you can call the unit and ask to speak with the hospitalist or the covering physician. Once you are discharged, your primary care physician will handle any further medical issues. Please note that NO REFILLS for any discharge medications will be authorized once you are discharged, as it is imperative that you return to your primary care physician (or establish a relationship with a primary care physician if you do not have one).   Discharge wound care:   Complete by: As directed    Increase activity slowly   Complete by: As directed        Discharge Medications:   Allergies as of 04/09/2021       Reactions   Irbesartan Other (See Comments)   Dizziness, muscle pain   Clindamycin/lincomycin Other (See Comments), Nausea And Vomiting   Stomach pain   Latex Rash   Ultram [tramadol] Rash        Medication List     STOP taking these medications    irbesartan 150 MG tablet  Commonly known as: Avapro   spironolactone 25 MG tablet Commonly known as: ALDACTONE       TAKE these medications    acetaminophen 500 MG tablet Commonly known as: TYLENOL Take  1,000 mg by mouth every 6 (six) hours as needed for moderate pain or headache.   aspirin 81 MG chewable tablet Chew 1 tablet (81 mg total) by mouth daily.   clopidogrel 75 MG tablet Commonly known as: PLAVIX Take 75 mg by mouth daily.   dapagliflozin propanediol 10 MG Tabs tablet Commonly known as: Farxiga Take 1 tablet (10 mg total) by mouth daily before breakfast.   DULoxetine 30 MG capsule Commonly known as: CYMBALTA Take 30 mg by mouth daily as needed (anxiety).   furosemide 40 MG tablet Commonly known as: LASIX Take 1 tablet (40 mg total) by mouth daily. What changed:  medication strength how much to take how to take this when to take this additional instructions   gabapentin 300 MG capsule Commonly known as: NEURONTIN Take 2 capsules by mouth 3 (three) times daily.   levothyroxine 50 MCG tablet Commonly known as: SYNTHROID Take 50 mcg by mouth daily.   Lidocaine 4 % Ptch Apply 1 patch topically daily as needed (pain).   metFORMIN 500 MG 24 hr tablet Commonly known as: GLUCOPHAGE-XR Take 4 tablets (2,000 mg total) by mouth daily. What changed:  how much to take when to take this   metoprolol succinate 50 MG 24 hr tablet Commonly known as: Toprol XL Take 1 tablet (50 mg total) by mouth daily. Take with or immediately following a meal. MAY TAKE EXTRA 25MG  FOR PALPITATIONS What changed:  when to take this additional instructions   NovoLIN 70/30 ReliOn (70-30) 100 UNIT/ML injection Generic drug: insulin NPH-regular Human 155 units with breakfast, and 125 units with supper, and needles 1/day What changed:  how much to take when to take this additional instructions   nystatin powder Commonly known as: MYCOSTATIN/NYSTOP Apply 1 application topically in the morning, at noon, in the evening, and at bedtime.   nystatin cream Commonly known as: MYCOSTATIN Apply 1 application. topically 3 (three) times daily. Abdominal folds   omeprazole 40 MG  capsule Commonly known as: PRILOSEC Take 40 mg by mouth 2 (two) times daily.   oxyCODONE-acetaminophen 5-325 MG tablet Commonly known as: PERCOCET/ROXICET Take 1 tablet by mouth every 6 (six) hours as needed for up to 5 days for severe pain.   potassium chloride 10 MEQ tablet Commonly known as: KLOR-CON Take 2 tablets (20 mEq total) by mouth daily.   rosuvastatin 40 MG tablet Commonly known as: CRESTOR Take 40 mg by mouth daily.   vitamin B-12 1000 MCG tablet Commonly known as: CYANOCOBALAMIN Take 1,000 mcg by mouth every evening.               Discharge Care Instructions  (From admission, onward)           Start     Ordered   04/09/21 0000  Discharge wound care:        04/09/21 1215             The results of significant diagnostics from this hospitalization (including imaging, microbiology, ancillary and laboratory) are listed below for reference.    Procedures and Diagnostic Studies:   DG Chest 2 View  Result Date: 04/06/2021 CLINICAL DATA:  Chest pain dizziness.  Shortness of breath. EXAM: CHEST - 2 VIEW COMPARISON:  04/14/2020 FINDINGS: There is are moderately decreased lung volumes  worsened from prior. Cardiac silhouette again appears mildly enlarged. Mediastinal contours are grossly unremarkable. Mild bilateral interstitial thickening may be secondary to the low lung volumes. No focal airspace opacity. No definite pleural effusion. No pneumothorax. No acute skeletal abnormality. IMPRESSION: Mild bilateral interstitial thickening may be secondary to moderately decreased lung volumes. Cannot entirely exclude minimal interstitial pulmonary edema. Electronically Signed   By: Neita Garnet M.D.   On: 04/06/2021 19:20   US RENAL  Result Date: 04/07/2021 CLINICAL DATA:  51 year old female with acute kidney injury. EXAM: RENAL / URINARY TRACT ULTRASOUND COMPLETE COMPARISON:  04/21/2020 FINDINGS: Right Kidney: Renal measurements: 9.4 x 4.9 x 5.1 cm = volume: 123  mL. Echogenicity within normal limits. No mass or hydronephrosis visualized. Left Kidney: Renal measurements: 12 x 6.2 x 6 cm = volume: 232 mL. Echogenicity within normal limits. No mass or hydronephrosis visualized. Bladder: Unremarkable except for small amount of dependent debris. Other: None. IMPRESSION: 1. Slightly small RIGHT kidney as compared to the LEFT. No evidence of hydronephrosis or renal mass. 2. Small amount of debris within the bladder. Correlate with urinalysis. Electronically Signed   By: Harmon Pier M.D.   On: 04/07/2021 13:54     Labs:   Basic Metabolic Panel: Recent Labs  Lab 04/06/21 1837 04/07/21 0158 04/08/21 0128 04/09/21 0233  NA 139 139 141 141  K 4.3 3.7 4.5 4.0  CL 100 102 105 108  CO2 GLUCOSE 112* 104* 121* 75  BUN 37* 36* 27* 22*  CREATININE 1.69* 1.44* 1.41* 1.20*  CALCIUM 9.2 9.0 8.6* 8.5*  MG  --  1.8  --   --   PHOS  --  4.6  --   --    GFR Estimated Creatinine Clearance: 88.1 mL/min (A) (by C-G formula based on SCr of 1.2 mg/dL (H)). Liver Function Tests: Recent Labs  Lab 04/07/21 0158  AST 14*  ALT 15  ALKPHOS 62  BILITOT 1.0  PROT 6.6  ALBUMIN 3.2*   No results for input(s): LIPASE, AMYLASE in the last 168 hours. No results for input(s): AMMONIA in the last 168 hours. Coagulation profile No results for input(s): INR, PROTIME in the last 168 hours.  CBC: Recent Labs  Lab 04/06/21 1837 04/07/21 0158 04/08/21 0128 04/09/21 0233  WBC 10.6* 10.9* 9.0 9.5  NEUTROABS  --   --  5.2 6.2  HGB 12.0 11.0* 11.3* 10.8*  HCT 37.5 33.9* 34.6* 33.6*  MCV 84.5 82.9 84.2 84.6  PLT 210 198 191 187   Cardiac Enzymes: Recent Labs  Lab 04/07/21 0158  CKTOTAL 147   BNP: Invalid input(s): POCBNP CBG: Recent Labs  Lab 04/08/21 2120 04/09/21 0815 04/09/21 0845 04/09/21 1148 04/09/21 1150  GLUCAP 136* 68* 93 64* 79   D-Dimer No results for input(s): DDIMER in the last 72 hours. Hgb A1c No results for input(s): HGBA1C  in the last 72 hours. Lipid Profile No results for input(s): CHOL, HDL, LDLCALC, TRIG, CHOLHDL, LDLDIRECT in the last 72 hours. Thyroid function studies No results for input(s): TSH, T4TOTAL, T3FREE, THYROIDAB in the last 72 hours.  Invalid input(s): FREET3 Anemia work up No results for input(s): VITAMINB12, FOLATE, FERRITIN, TIBC, IRON, RETICCTPCT in the last 72 hours. Microbiology Recent Results (from the past 240 hour(s))  Blood culture (routine x 2)     Status: None (Preliminary result)   Collection Time: 04/06/21  9:06 PM   Specimen: BLOOD RIGHT HAND  Result Value Ref Range Status   Specimen  Description BLOOD RIGHT HAND  Final   Special Requests   Final    BOTTLES DRAWN AEROBIC AND ANAEROBIC Blood Culture adequate volume   Culture   Final    NO GROWTH 3 DAYS Performed at [facility], [address] [place], [place] [address]    Report Status PENDING  Incomplete  Resp Panel by RT-PCR (Flu A&B, Covid)     Status: None   Collection Time: 04/06/21  9:32 PM   Specimen: Nasopharyngeal(NP) swabs in vial transport medium  Result Value Ref Range Status   SARS Coronavirus 2 by RT PCR NEGATIVE NEGATIVE Final    Comment: (NOTE) SARS-CoV-2 target nucleic acids are NOT DETECTED.  The SARS-CoV-2 RNA is generally detectable in upper respiratory specimens during the acute phase of infection. The lowest concentration of SARS-CoV-2 viral copies this assay can detect is 138 copies/mL. A negative result does not preclude SARS-Cov-2 infection and should not be used as the sole basis for treatment or other patient management decisions. A negative result may occur with  improper specimen collection/handling, submission of specimen other than nasopharyngeal swab, presence of viral mutation(s) within the areas targeted by this assay, and inadequate number of viral copies(<138 copies/mL). A negative result must be combined with clinical observations, patient history, and  epidemiological information. The expected result is Negative.  Fact Sheet for Patients:  BloggerCourse.comhttps://www.fda.gov/media/152166/download  Fact Sheet for Healthcare Providers:  SeriousBroker.ithttps://www.fda.gov/media/152162/download  This test is no t yet approved or cleared by the [place] FDA and  has been authorized for detection and/or diagnosis of SARS-CoV-2 by FDA under an Emergency Use Authorization (EUA). This EUA will remain  in effect (meaning this test can be used) for the duration of the COVID-19 declaration under Section 564(b)(1) of the Act, 21 U.S.C.section 360bbb-3(b)(1), unless the authorization is terminated  or revoked sooner.       Influenza A by PCR NEGATIVE NEGATIVE Final   Influenza B by PCR NEGATIVE NEGATIVE Final    Comment: (NOTE) The Xpert Xpress SARS-CoV-2/FLU/RSV plus assay is intended as an aid in the diagnosis of influenza from Nasopharyngeal swab specimens and should not be used as a sole basis for treatment. Nasal washings and aspirates are unacceptable for Xpert Xpress SARS-CoV-2/FLU/RSV testing.  Fact Sheet for Patients: BloggerCourse.comhttps://www.fda.gov/media/152166/download  Fact Sheet for Healthcare Providers: SeriousBroker.ithttps://www.fda.gov/media/152162/download  This test is not yet approved or cleared by the [place] FDA and has been authorized for detection and/or diagnosis of SARS-CoV-2 by FDA under an Emergency Use Authorization (EUA). This EUA will remain in effect (meaning this test can be used) for the duration of the COVID-19 declaration under Section 564(b)(1) of the Act, 21 U.S.C. section 360bbb-3(b)(1), unless the authorization is terminated or revoked.  Performed at [facility], [address] [place], [place] [address]   Blood culture (routine x 2)     Status: None (Preliminary result)   Collection Time: 04/06/21  9:57 PM   Specimen: BLOOD  Result Value Ref Range Status   Specimen Description BLOOD BLOOD LEFT FOREARM  Final   Special Requests    Final    BOTTLES DRAWN AEROBIC AND ANAEROBIC Blood Culture adequate volume   Culture   Final    NO GROWTH 3 DAYS Performed at [facility], [address] [place], [place] [address]    Report Status PENDING  Incomplete    Time coordinating discharge: 35 minutes  Signed: Natha Guin  [facility] 04/09/2021, 12:15 PM

## 2021-04-09 NOTE — Plan of Care (Signed)

## 2021-04-09 NOTE — Progress Notes (Signed)
Inpatient Diabetes Program Recommendations ? ?AACE/ADA: New Consensus Statement on Inpatient Glycemic Control (2015) ? ?Target Ranges:  Prepandial:   less than 140 mg/dL ?     Peak postprandial:   less than 180 mg/dL (1-2 hours) ?     Critically ill patients:  140 - 180 mg/dL  ? ?Lab Results  ?Component Value Date  ? GLUCAP 79 04/09/2021  ? HGBA1C 9.2 (A) 02/27/2021  ? ? ?Review of Glycemic Control ? Latest Reference Range & Units 04/09/21 08:15 04/09/21 08:45 04/09/21 11:48 04/09/21 11:50  ?Glucose-Capillary 70 - 99 mg/dL 68 (L) 93 64 (L) 79  ?(L): Data is abnormally low ?Diabetes history: Type 2 Dm ?Outpatient Diabetes medications: Novolin 70/30 155 units QA, 125 units QP, Farxiga 10 mg QA, Metformin 1000 mg BID ?Current orders for Inpatient glycemic control: Novolog 70/30 135 units BID, Farxiga 10 mg QA ? ?Inpatient Diabetes Program Recommendations:   ?Noted hypoglycemia this Am.  ?Consider reducing Novolog 70/30 to 125 units BID.  ? ?Thanks, ?Bronson Curb, MSN, RNC-OB ?Diabetes Coordinator ?[phone] (8a-5p) ? ? ? ? ?

## 2021-04-11 ENCOUNTER — Institutional Professional Consult (permissible substitution): Payer: Self-pay | Admitting: Pulmonary Disease

## 2021-04-11 LAB — CULTURE, BLOOD (ROUTINE X 2)
Culture: NO GROWTH
Culture: NO GROWTH
Special Requests: ADEQUATE
Special Requests: ADEQUATE

## 2021-04-16 ENCOUNTER — Other Ambulatory Visit: Payer: Self-pay | Admitting: Cardiology

## 2021-04-19 ENCOUNTER — Other Ambulatory Visit: Payer: Self-pay

## 2021-04-20 ENCOUNTER — Telehealth: Payer: Self-pay | Admitting: Cardiology

## 2021-04-20 NOTE — Telephone Encounter (Signed)
Spoke with pt, Aware of dr crenshaw's recommendations.  °

## 2021-04-20 NOTE — Telephone Encounter (Signed)
I spoke to patient and I informed her that as per Dr Stanford Breed patient is to stop taking Clopidogrel (Plavix) 75 MG when completed. Then she asked the question of what to take for her cholesterol if she stops taking Clopidogrel. I informed patient I could not answer that question for her but I will pass the question along to Dr Stanford Breed. Patient also told me she was in the hospital 10 days after she saw Dr Stanford Breed on 03/28/2021 and wanted him to take a look at her medical history. I told her I will pass this information along to Dr Stanford Breed as well. Patient acknowledged my statement and understood. ?

## 2021-04-20 NOTE — Telephone Encounter (Signed)
?*  STAT* If patient is at the pharmacy, call can be transferred to refill team. ? ? ?1. Which medications need to be refilled? (please list name of each medication and dose if known) clopidogrel (PLAVIX) 75 MG tablet ? ?2. Which pharmacy/location (including street and city if local pharmacy) is medication to be sent to? [id number] - [place] ([place]), [place] - [address] ? ?3. Do they need a 30 day or 90 day supply? 30 day  ? ?Patient is out of meds.  ?

## 2021-05-02 ENCOUNTER — Ambulatory Visit: Payer: Medicaid Other | Admitting: Endocrinology

## 2021-05-07 ENCOUNTER — Ambulatory Visit: Payer: Medicaid Other | Admitting: Dietician

## 2021-05-31 ENCOUNTER — Other Ambulatory Visit: Payer: Self-pay

## 2021-05-31 ENCOUNTER — Ambulatory Visit (INDEPENDENT_AMBULATORY_CARE_PROVIDER_SITE_OTHER): Payer: BC Managed Care – PPO | Admitting: Endocrinology

## 2021-05-31 ENCOUNTER — Encounter: Payer: Self-pay | Admitting: Endocrinology

## 2021-05-31 VITALS — BP 138/70 | HR 73 | Ht 66.0 in | Wt 364.2 lb

## 2021-05-31 DIAGNOSIS — E782 Mixed hyperlipidemia: Secondary | ICD-10-CM | POA: Diagnosis not present

## 2021-05-31 DIAGNOSIS — Z794 Long term (current) use of insulin: Secondary | ICD-10-CM

## 2021-05-31 DIAGNOSIS — E1165 Type 2 diabetes mellitus with hyperglycemia: Secondary | ICD-10-CM | POA: Diagnosis not present

## 2021-05-31 LAB — COMPREHENSIVE METABOLIC PANEL
ALT: 13 U/L (ref 0–35)
AST: 12 U/L (ref 0–37)
Albumin: 3.9 g/dL (ref 3.5–5.2)
Alkaline Phosphatase: 79 U/L (ref 39–117)
BUN: 25 mg/dL — ABNORMAL HIGH (ref 6–23)
CO2: 32 mEq/L (ref 19–32)
Calcium: 8.9 mg/dL (ref 8.4–10.5)
Chloride: 101 mEq/L (ref 96–112)
Creatinine, Ser: 0.91 mg/dL (ref 0.40–1.20)
GFR: 73.83 mL/min (ref >60.00)
Glucose, Bld: 117 mg/dL — ABNORMAL HIGH (ref 70–99)
Potassium: 4 mEq/L (ref 3.5–5.1)
Sodium: 142 mEq/L (ref 135–145)
Total Bilirubin: 0.7 mg/dL (ref 0.2–1.2)
Total Protein: 6.5 g/dL (ref 6.0–8.3)

## 2021-05-31 LAB — TSH: TSH: 2.98 u[IU]/mL (ref 0.35–5.50)

## 2021-05-31 LAB — POCT GLUCOSE (DEVICE FOR HOME USE): Glucose Fasting, POC: 121 mg/dL — AB (ref 70–99)

## 2021-05-31 LAB — POCT GLYCOSYLATED HEMOGLOBIN (HGB A1C): Hemoglobin A1C: 7.2 % — AB (ref 4.0–5.6)

## 2021-05-31 LAB — LIPID PANEL
Cholesterol: 121 mg/dL (ref 0–200)
HDL: 38.6 mg/dL — ABNORMAL LOW (ref >39.00)
LDL Cholesterol: 48 mg/dL (ref 0–99)
NonHDL: 82.78
Total CHOL/HDL Ratio: 3
Triglycerides: 175 mg/dL — ABNORMAL HIGH (ref 0.0–149.0)
VLDL: 35 mg/dL (ref 0.0–40.0)

## 2021-05-31 MED ORDER — FREESTYLE LIBRE 3 SENSOR MISC
1.0000 | 2 refills | Status: DC
Start: 2021-05-31 — End: 2022-02-13

## 2021-05-31 NOTE — Patient Instructions (Signed)
Libre: get [employer] 3 app ? ?Take 2 Metformin only ? ?Check blood sugars on waking up  ? ?Also check blood sugars about 2 hours after meals and do this after different meals by rotation ? ?Recommended blood sugar levels on waking up are 90-130 and about 2 hours after meal is 130-160 ? ? ?

## 2021-05-31 NOTE — Progress Notes (Signed)
What is 26 no 73 ? ?Subjective:  ? ? Patient ID: [name], female    DOB: 01-Aug-1970, 51 y.o.   MRN: [id number] ? ?HPI ? ? ? ?Diagnosis date: age 61, 3 ? ?Previous history: ? ?Initially was treated with Glucophage alone and not clear if she took other medications also ?Presumably about 8 years ago she was started on insulin likely because of poor control ?A1c has been as high as 12% in the past ? ?Recent history: ?    ?Non-insulin hypoglycemic drugs: Metformin ER 500 mg, 2 g daily ?    ?Insulin regimen: Novolin 70/30 150 units before breakfast and 125 units before dinner     ?   ?Side effects from medications: Nausea from high-dose metformin ? ?Current self management, blood sugar patterns and problems identified: ? ?Blood sugars appear to have improved last December with adding Farxiga ?Also likely her insulin dose has been increased ?She is using the [facility] meter and did not bring any records ?Although she does not think she has hypoglycemia overnight she will sometimes feel weak in the mornings when blood sugars are low normal ?She will eat a snack with carbohydrate at bedtime if blood sugars are close to normal to prevent low sugars ?She does not think her blood sugars are high anytime during the day but mostly checking after lunch or dinner ?She has had a tendency to weight gain especially recently ?For the last month or so she has not been able to afford the Marcelline Deist that she had initially started during her hospitalization in March as it is costing her over $500 a month because of high deductible ? ? ?Diet management: She thinks she is eating healthy meals and avoiding all simple sugars ?     ?Monitors blood glucose: 3x a day.    Glucometer: [facility] brand.          ? ?Blood Glucose readings from recall:  ? ?PRE-MEAL Fasting Lunch Dinner Bedtime Overall  ?Glucose range: 79-113   100-130   ?Mean/median:       ? ?POST-MEAL PC Breakfast PC Lunch PC Dinner  ?Glucose range:  125-130   ?Mean/median:      ? ? ? ?Dietician visit: Years ago ? ?Weight control: ? ?Wt Readings from Last 3 Encounters:  ?05/31/21 (!) 364 lb 3.2 oz (165.2 kg)  ?04/06/21 (!) 346 lb 5.5 oz (157.1 kg)  ?03/28/21 (!) 346 lb 6.4 oz (157.1 kg)  ? ?  ?  ?Complications:    CAD, Charcot foot, DR, and PN ? ?Lab Results  ?Component Value Date  ? HGBA1C 7.2 (A) 05/31/2021  ? HGBA1C 9.2 (A) 02/27/2021  ? HGBA1C 9.7 (A) 01/01/2021  ? ?Lab Results  ?Component Value Date  ? LDLCALC 57 03/28/2020  ? CREATININE 1.20 (H) 04/09/2021  ? ? ? ? ?Past Medical History:  ?Diagnosis Date  ? CAD (coronary artery disease)   ? a. s/p NSTEMI, LHC 03/2020 dRCA 25%, m-LAD-1 95%-0% (DES), mLAD-2 50%, LVEDP moderately elevated, 27 mmHg.  ? Chronic diastolic heart failure (HCC)   ? a. LHC/echo 3/22 c/ elevated LVEDP, EF 50-55%, hypokinesis; b. Echo 10/22 EF 60-65%, mild LVH, borderline dilatation of ascending aorta, 46mm.  ? DDD (degenerative disc disease), lumbar   ? Diabetes mellitus without complication (HCC)   ? GERD (gastroesophageal reflux disease)   ? Hyperlipidemia   ? Hypertension   ? Hypothyroidism   ? Lumbar herniated disc   ? Major depressive disorder   ? Morbid obesity (HCC)   ? ? ?  Past Surgical History:  ?Procedure Laterality Date  ? CORONARY STENT INTERVENTION N/A 03/28/2020  ? Procedure: CORONARY STENT INTERVENTION;  Surgeon: Corky Crafts, MD;  Location: [facility] INVASIVE CV LAB;  Service: Cardiovascular;  Laterality: N/A;  ? DILATION AND CURETTAGE OF UTERUS    ? INCISION AND DRAINAGE PERIRECTAL ABSCESS  06/21/2014  ? INCISION AND DRAINAGE PERIRECTAL ABSCESS Bilateral 06/21/2014  ? Procedure: IRRIGATION AND DEBRIDEMENT PERIRECTAL ABSCESS;  Surgeon: Abigail Miyamoto, MD;  Location: [facility] OR;  Service: General;  Laterality: Bilateral;  ? LEFT HEART CATH AND CORONARY ANGIOGRAPHY N/A 03/28/2020  ? Procedure: LEFT HEART CATH AND CORONARY ANGIOGRAPHY;  Surgeon: Corky Crafts, MD;  Location: [facility] INVASIVE CV LAB;  Service: Cardiovascular;  Laterality: N/A;  ? ? ?Social  History  ? ?Socioeconomic History  ? Marital status: Single  ?  Spouse name: Not on file  ? Number of children: Not on file  ? Years of education: Not on file  ? Highest education level: Not on file  ?Occupational History  ? Not on file  ?Tobacco Use  ? Smoking status: Never  ? Smokeless tobacco: Never  ?Vaping Use  ? Vaping Use: Never used  ?Substance and Sexual Activity  ? Alcohol use: No  ? Drug use: No  ? Sexual activity: Not on file  ?Other Topics Concern  ? Not on file  ?Social History Narrative  ? Not on file  ? ?Social Determinants of Health  ? ?Financial Resource Strain: Not on file  ?Food Insecurity: Not on file  ?Transportation Needs: Not on file  ?Physical Activity: Not on file  ?Stress: Not on file  ?Social Connections: Not on file  ?Intimate Partner Violence: Not on file  ? ? ?Current Outpatient Medications on File Prior to Visit  ?Medication Sig Dispense Refill  ? acetaminophen (TYLENOL) 500 MG tablet Take 1,000 mg by mouth every 6 (six) hours as needed for moderate pain or headache.    ? aspirin 81 MG chewable tablet Chew 1 tablet (81 mg total) by mouth daily.    ? Cholecalciferol (VITAMIN D3) 50 MCG (2000 UT) capsule Take 2,000 Units by mouth daily.    ? furosemide (LASIX) 40 MG tablet Take 1 tablet (40 mg total) by mouth daily.    ? gabapentin (NEURONTIN) 300 MG capsule Take 2 capsules by mouth 3 (three) times daily.    ? insulin NPH-regular Human (NOVOLIN 70/30 RELION) (70-30) 100 UNIT/ML injection 155 units with breakfast, and 125 units with supper, and needles 1/day (Patient taking differently: 125-150 Units See admin instructions. 150 units with breakfast, and 125 units with supper.) 90 mL 3  ? levothyroxine (SYNTHROID, LEVOTHROID) 50 MCG tablet Take 50 mcg by mouth daily.  3  ? metFORMIN (GLUCOPHAGE-XR) 500 MG 24 hr tablet Take 4 tablets (2,000 mg total) by mouth daily. (Patient taking differently: Take 1,000 mg by mouth 2 (two) times daily.) 360 tablet 3  ? nystatin (MYCOSTATIN/NYSTOP) powder  Apply 1 application topically in the morning, at noon, in the evening, and at bedtime.    ? nystatin cream (MYCOSTATIN) Apply 1 application. topically 3 (three) times daily. Abdominal folds    ? omeprazole (PRILOSEC) 40 MG capsule Take 40 mg by mouth 2 (two) times daily.    ? potassium chloride (KLOR-CON) 10 MEQ tablet Take 2 tablets (20 mEq total) by mouth daily. 180 tablet 3  ? rosuvastatin (CRESTOR) 40 MG tablet Take 40 mg by mouth daily.    ? vitamin B-12 (CYANOCOBALAMIN) 1000 MCG tablet Take  1,000 mcg by mouth every evening.    ? clopidogrel (PLAVIX) 75 MG tablet Take 75 mg by mouth daily. (Patient not taking: Reported on 05/31/2021)    ? dapagliflozin propanediol (FARXIGA) 10 MG TABS tablet Take 1 tablet (10 mg total) by mouth daily before breakfast. (Patient not taking: Reported on 05/31/2021) 90 tablet 3  ? DULoxetine (CYMBALTA) 30 MG capsule Take 30 mg by mouth daily as needed (anxiety). (Patient not taking: Reported on 05/31/2021)    ? metoprolol succinate (TOPROL XL) 50 MG 24 hr tablet Take 1 tablet (50 mg total) by mouth daily. Take with or immediately following a meal. MAY TAKE EXTRA 25MG  FOR PALPITATIONS (Patient not taking: Reported on 05/31/2021) 45 tablet 11  ? ?No current facility-administered medications on file prior to visit.  ? ? ?Allergies  ?Allergen Reactions  ? Irbesartan Other (See Comments)  ?  Dizziness, muscle pain  ? Clindamycin/Lincomycin Other (See Comments) and Nausea And Vomiting  ?  Stomach pain ?  ? Latex Rash  ? Ultram [Tramadol] Rash  ? ? ?Family History  ?Problem Relation Age of Onset  ? Diabetes Mother   ? Diabetes Father   ? Heart disease Neg Hx   ? ? ?BP 138/70   Pulse 73   Ht 5\' 6"  (1.676 m)   Wt (!) 364 lb 3.2 oz (165.2 kg)   SpO2 99%   BMI 58.78 kg/m?  ? ?Review of Systems  ?Cardiovascular:  Positive for leg swelling.  ?     Since 2022  ? ?NEUROPATHY: She takes gabapentin for neuropathic pains in the legs ? ?Hypothyroidism: She has had mild hypothyroidism for several years  on 50ug levothyroxine managed by her PCP ? ?Lab Results  ?Component Value Date  ? TSH 2.04 01/01/2021  ? TSH 5.423 (H) 03/28/2020  ? FREET4 0.90 01/01/2021  ? FREET4 1.02 03/28/2020  ? ?LIPIDS: She has been pres

## 2021-06-26 NOTE — Progress Notes (Deleted)
Cardiology Office Note:    Date:  06/26/2021   ID:  [name], DOB Aug 13, 1970, MRN [id number]  PCP:  Elizabeth Palau, FNP   [facility] Providers Cardiologist:  [name], MD      Referring MD: Elizabeth Palau, FNP   Follow-up evaluation of her acute on chronic diastolic CHF, and CAD.  History of Present Illness:    [name] is a 51 y.o. female with a hx of hypertension, GERD, depression, hypothyroidism, morbid obesity, uncontrolled type 2 diabetes, and chronic diastolic CHF.   She was admitted to hospital with chest discomfort and on 03/28/2020.  Her high-sensitivity troponins were elevated at 514 and climbed to over 2000.  Her EKG showed nonspecific changes.  She was diagnosed with NSTEMI and taken to the Cath Lab.  Cardiac catheterization showed 25% distal RCA, mid LAD 95%, mid LAD-to 50%, moderately elevated LVEDP at 27 mmHg.  She received PCI with DES x1 to her mid LAD.  Her echocardiogram at that time showed normal LVEF, septal/apical hypokinesis, normal diastolic function, no valvular abnormalities and normal right ventricular function.   She followed up with heart and vascular transition of care clinic 04/06/2020.  At that time she reported she felt achy since being discharged from the hospital.  She denied chest pain except with deep inhalation.  Her discomfort resolved with exhalation.  She reported overall feeling better.  Her medications were reviewed.  It was noted that her pillbox was not in order, containing days with missing doses of certain medications and multiple doses on other days.  She reported having very poor vision and having difficulty with her pill bottles.   She presented to the clinic 06/06/20 for follow-up evaluation stated she felt fairly well.  She did report that she had increased anxiety related to doctors visits and medical procedures.  She ha noticed occasional brief episodes of sharp chest pain.  She reported that the pain could be  reproduced with pressing over the area.  I educated her that this was related to muscle wall pain.  She expressed understanding.  She reported that she did have occasional brief episodes of palpitations.  She reported that these would happen a couple times a week.  She also reported that she had presented to the emergency department with complaints of lower extremity swelling.  She was instructed to take extra doses of her furosemide.  She reported that she followed a low-sodium diet and restricted her fluid.  She was breathing much better with 2 L of oxygen via nasal cannula.  We gave  her the [place] support stocking sheet, salty 6 diet sheet, have her increase her physical activity as tolerated, continue current medication regimen and order a BMP.   Follow-up was planned for 6 months with Dr. Jens Som.  She presented to the clinic 10/18/20 for follow-up evaluation stated she had noticed that she was more fatigued over the last several months.  She noticed that she continued to need her oxygen.  She reported that when she was asleep at night and her oxygen would fall off her saturation will drop into the 70s.   She was maintaining a heart healthy low-sodium diet.  She reported that she did have some lower extremity numbness and that she had a herniated lumbar disc.  She continued to have some right-sided/shoulder area discomfort as well as left-sided shoulder discomfort.  Appeared to be related to deconditioning and musculoskeletal pain related to the use of her walker.  Her weight had increased  about 10-11 pounds.  She denied  bleeding issues.  She performed a walking test without her oxygen and her oxygen saturation dropped to 87 after only ambulating 10-12 feet.  I ordered a sleep study, sent my note to her insurance company so that she may maintain her oxygen therapy, asked to her increase her physical activity, repeated an echocardiogram, and planned follow-up after her sleep study.  We reviewed the  importance of weight loss and increasing physical activity.  I recommended that she start doing chair type exercises multiple times per day.  She was seen in follow-up by Dr. Jens Som on 03/28/2021.  Her echocardiogram 10/22 showed normal LV function, mid left ventricular enlargement, mild left ventricular hypertrophy.  She reported multiple complaints.  She noted dyspnea on exertion.  She described bilateral lower extremity edema which was worse with her legs down.  She noted occasional pain in her right chest that was unlike her previous infarct pain.  She denied syncope.  Her Lasix was continued.  She was instructed to follow-up with pulmonology for her chronic dyspnea/respiratory failure and for possible sleep apnea.  Her rosuvastatin was increased to 40 mg daily with plan to repeat lipids and LFTs in 8 weeks.  She presents to the clinic today for follow-up evaluation states***   Today she denies chest pain, shortness of breath, lower extremity edema, fatigue, palpitations, melena, hematuria, hemoptysis, diaphoresis, weakness, presyncope, syncope, orthopnea, and PND.    Past Medical History:  Diagnosis Date   CAD (coronary artery disease)    a. s/p NSTEMI, LHC 03/2020 dRCA 25%, m-LAD-1 95%-0% (DES), mLAD-2 50%, LVEDP moderately elevated, 27 mmHg.   Chronic diastolic heart failure (HCC)    a. LHC/echo 3/22 c/ elevated LVEDP, EF 50-55%, hypokinesis; b. Echo 10/22 EF 60-65%, mild LVH, borderline dilatation of ascending aorta, 80mm.   DDD (degenerative disc disease), lumbar    Diabetes mellitus without complication (HCC)    GERD (gastroesophageal reflux disease)    Hyperlipidemia    Hypertension    Hypothyroidism    Lumbar herniated disc    Major depressive disorder    Morbid obesity (HCC)     Past Surgical History:  Procedure Laterality Date   CORONARY STENT INTERVENTION N/A 03/28/2020   Procedure: CORONARY STENT INTERVENTION;  Surgeon: Corky Crafts, MD;  Location: [facility] INVASIVE CV  LAB;  Service: Cardiovascular;  Laterality: N/A;   DILATION AND CURETTAGE OF UTERUS     INCISION AND DRAINAGE PERIRECTAL ABSCESS  06/21/2014   INCISION AND DRAINAGE PERIRECTAL ABSCESS Bilateral 06/21/2014   Procedure: IRRIGATION AND DEBRIDEMENT PERIRECTAL ABSCESS;  Surgeon: Abigail Miyamoto, MD;  Location: [facility] OR;  Service: General;  Laterality: Bilateral;   LEFT HEART CATH AND CORONARY ANGIOGRAPHY N/A 03/28/2020   Procedure: LEFT HEART CATH AND CORONARY ANGIOGRAPHY;  Surgeon: Corky Crafts, MD;  Location: [facility] INVASIVE CV LAB;  Service: Cardiovascular;  Laterality: N/A;    Current Medications: No outpatient medications have been marked as taking for the 07/02/21 encounter (Appointment) with Ronney Asters, NP.     Allergies:   Irbesartan, Clindamycin/lincomycin, Latex, and Ultram [tramadol]   Social History   Socioeconomic History   Marital status: Single    Spouse name: Not on file   Number of children: Not on file   Years of education: Not on file   Highest education level: Not on file  Occupational History   Not on file  Tobacco Use   Smoking status: Never   Smokeless tobacco: Never  Vaping Use  Vaping Use: Never used  Substance and Sexual Activity   Alcohol use: No   Drug use: No   Sexual activity: Not on file  Other Topics Concern   Not on file  Social History Narrative   Not on file   Social Determinants of Health   Financial Resource Strain: Not on file  Food Insecurity: Not on file  Transportation Needs: Not on file  Physical Activity: Not on file  Stress: Not on file  Social Connections: Not on file     Family History: The patient's family history includes Diabetes in her father and mother. There is no history of Heart disease.  ROS:   Please see the history of present illness.     All other systems reviewed and are negative.   Risk Assessment/Calculations:           Physical Exam:    VS:  There were no vitals taken for this visit.    Wt  Readings from Last 3 Encounters:  05/31/21 (!) 364 lb 3.2 oz (165.2 kg)  04/06/21 (!) 346 lb 5.5 oz (157.1 kg)  03/28/21 (!) 346 lb 6.4 oz (157.1 kg)     GEN:  Well nourished, well developed in no acute distress HEENT: Normal NECK: No JVD; No carotid bruits LYMPHATICS: No lymphadenopathy CARDIAC: RRR, no murmurs, rubs, gallops RESPIRATORY:  Clear to auscultation without rales, wheezing or rhonchi  ABDOMEN: Soft, non-tender, non-distended MUSCULOSKELETAL:  No edema; No deformity  SKIN: Warm and dry NEUROLOGIC:  Alert and oriented x 3 PSYCHIATRIC:  Normal affect    EKGs/Labs/Other Studies Reviewed:    The following studies were reviewed today:  Echocardiogram 03/28/2020 IMPRESSIONS     1. Overall poor image quality even with definity Septal and apical  hypokinesis . Left ventricular ejection fraction, by estimation, is 50 to  55%. The left ventricle has low normal function. The left ventricle has no  regional wall motion abnormalities.  Left ventricular diastolic parameters were normal.   2. Right ventricular systolic function is normal. The right ventricular  size is normal.   3. Left atrial size was mildly dilated.   4. The mitral valve is normal in structure. No evidence of mitral valve  regurgitation. No evidence of mitral stenosis.   5. The aortic valve was not well visualized. Aortic valve regurgitation  is not visualized. No aortic stenosis is present.   6. The inferior vena cava is normal in size with greater than 50%  respiratory variability, suggesting right atrial pressure of 3 mmHg.   Echocardiogram 11/10/2020  IMPRESSIONS     1. Left ventricular ejection fraction, by estimation, is 60 to 65%. The  left ventricle has normal function. The left ventricle has no regional  wall motion abnormalities. The left ventricular internal cavity size was  mildly dilated. There is mild left  ventricular hypertrophy. Left ventricular diastolic parameters were  normal.    2. Right ventricular systolic function is normal. The right ventricular  size is normal.   3. The mitral valve is normal in structure. Trivial mitral valve  regurgitation. No evidence of mitral stenosis.   4. The aortic valve is normal in structure. Aortic valve regurgitation is  not visualized. No aortic stenosis is present.   5. Aortic dilatation noted. There is borderline dilatation of the  ascending aorta, measuring 39 mm.   6. The inferior vena cava is normal in size with greater than 50%  respiratory variability, suggesting right atrial pressure of 3 mmHg.   Comparison(s):  EF 50 %, hypokinesis.  Cardiac catheterization 03/28/2020 Dist RCA lesion is 25% stenosed. Mid LAD-1 lesion is 95% stenosed. A drug-eluting stent was successfully placed using a STENT RESOLUTE ONYX [id number]. The proximal stent was postdilated with a 3.75 mm Blain baloon. Post intervention, there is a 0% residual stenosis. Mid LAD-2 lesion is 50% stenosed. LV end diastolic pressure is moderately elevated. LVEDP 27 mm Hg. There is no aortic valve stenosis.   Continue dual antiplatelet therapy for 12 months. Consider clopidogrel monotherapy after 12 months, along with aggressive secondary prevention.     Increase statin to high potency.     Continue with plans for diuresis.  D/w Dr. Jens Som.  Diagnostic Dominance: Right     Intervention          EKG: None today.  Recent Labs: 04/06/2021: B Natriuretic Peptide 22.8 04/07/2021: Magnesium 1.8 04/09/2021: Hemoglobin 10.8; Platelets 187 05/31/2021: ALT 13; BUN 25; Creatinine, Ser 0.91; Potassium 4.0; Sodium 142; TSH 2.98  Recent Lipid Panel    Component Value Date/Time   CHOL 121 05/31/2021 1220   TRIG 175.0 (H) 05/31/2021 1220   HDL 38.60 (L) 05/31/2021 1220   CHOLHDL 3 05/31/2021 1220   VLDL 35.0 05/31/2021 1220   LDLCALC 48 05/31/2021 1220    ASSESSMENT & PLAN   Chronic diastolic CHF-weight stable.  Euvolemic.   Continues to have dyspnea with  increased physical activity.   Continue spironolactone, potassium, furosemide, metoprolol Heart healthy low-sodium diet-salty 6 given Increase physical activity as tolerated Daily weights-contact office with any increase of 3 pounds overnight or 5 pounds in 1 week-reviewed Continue lower extremity support stockings Elevate lower extremities when not active.  Fatigue/DOE-stable.  Continues with 2 L of oxygen saturating in the high to mid 90s.  Previously discussed obstructive sleep apnea sleep study.  Patient will follow-up with pulmonology.   Continue 2 L of O2 Follows with pulmonology  Coronary artery disease-no chest pain today.  Continues with chronic right-sided chest discomfort.  Not felt to be related to cardiac issues.  Reassured that this was musculoskeletal in nature.    Cardiac catheterization 03/28/2020 which showed LAD lesions x2.  She received mid LAD PCI and DES x1.  Details above. Continue aspirin,  rosuvastatin, metoprolol Heart healthy low-sodium diet-salty 6 given Increase physical activity as tolerated May stop Plavix.   Essential hypertension-BP today ***139/58.  Well-controlled at home. Continue metoprolol, spironolactone Heart healthy low-sodium diet-salty 6 given Increase physical activity as tolerated    Hyperlipidemia-03/28/2020: Cholesterol 106; HDL 27; LDL Cholesterol 57; Triglycerides 110; VLDL 22 Continue rosuvastatin Heart healthy low-sodium high-fiber diet Increase physical activity as tolerated Repeat fasting lipids and LFTs   Type 2 diabetes- glucose 117 on 05/31/21 Continue Novolin, metformin Follows with PCP   Morbid obesity-weight today ***361 pounds.  Continues to be limited her mobility due to low back pain and lower extremity numbness. Continue weight loss Follows with PCP   Disposition: Follow-up with Dr. Jens Som in 6-9 months.        Medication Adjustments/Labs and Tests Ordered: Current medicines are reviewed at length with the patient  today.  Concerns regarding medicines are outlined above.  No orders of the defined types were placed in this encounter.  No orders of the defined types were placed in this encounter.    There are no Patient Instructions on file for this visit.   Signed, Ronney Asters, NP  06/26/2021 7:13 AM      Notice: This dictation was prepared with Dragon dictation along  with smaller phrase technology. Any transcriptional errors that result from this process are unintentional and may not be corrected upon review.  I spent 14*** minutes examining this patient, reviewing medications, and using patient centered shared decision making involving her cardiac care.  Prior to her visit I spent greater than 20 minutes reviewing her past medical history,  medications, and prior cardiac tests.

## 2021-07-02 ENCOUNTER — Other Ambulatory Visit: Payer: Self-pay | Admitting: General Practice

## 2021-07-02 ENCOUNTER — Ambulatory Visit: Payer: BC Managed Care – PPO | Admitting: General Practice

## 2021-07-17 NOTE — Progress Notes (Deleted)
Cardiology Office Note:    Date:  07/17/2021   ID:  [name], DOB 02/19/1970, MRN [id number]  PCP:  Elizabeth PalauAnderson, Teresa, FNP   [facility] Providers Cardiologist:  Olga MillersBrian Crenshaw, MD      Referring MD: Elizabeth PalauAnderson, Teresa, FNP   Follow-up evaluation of her acute on chronic diastolic CHF, and CAD.  History of Present Illness:    Caitlin MustardCrystal [name] is a 51 y.o. female with a hx of hypertension, GERD, depression, hypothyroidism, morbid obesity, uncontrolled type 2 diabetes, and chronic diastolic CHF.   She was admitted to hospital with chest discomfort and on 03/28/2020.  Her high-sensitivity troponins were elevated at 514 and climbed to over 2000.  Her EKG showed nonspecific changes.  She was diagnosed with NSTEMI and taken to the Cath Lab.  Cardiac catheterization showed 25% distal RCA, mid LAD 95%, mid LAD-to 50%, moderately elevated LVEDP at 27 mmHg.  She received PCI with DES x1 to her mid LAD.  Her echocardiogram at that time showed normal LVEF, septal/apical hypokinesis, normal diastolic function, no valvular abnormalities and normal right ventricular function.   She followed up with heart and vascular transition of care clinic 04/06/2020.  At that time she reported she felt achy since being discharged from the hospital.  She denied chest pain except with deep inhalation.  Her discomfort resolved with exhalation.  She reported overall feeling better.  Her medications were reviewed.  It was noted that her pillbox was not in order, containing days with missing doses of certain medications and multiple doses on other days.  She reported having very poor vision and having difficulty with her pill bottles.   She presented to the clinic 06/06/20 for follow-up evaluation stated she felt fairly well.  She did report that she had increased anxiety related to doctors visits and medical procedures.  She ha noticed occasional brief episodes of sharp chest pain.  She reported that the pain could be  reproduced with pressing over the area.  I educated her that this was related to muscle wall pain.  She expressed understanding.  She reported that she did have occasional brief episodes of palpitations.  She reported that these would happen a couple times a week.  She also reported that she had presented to the emergency department with complaints of lower extremity swelling.  She was instructed to take extra doses of her furosemide.  She reported that she followed a low-sodium diet and restricted her fluid.  She was breathing much better with 2 L of oxygen via nasal cannula.  We gave  her the  support stocking sheet, salty 6 diet sheet, have her increase her physical activity as tolerated, continue current medication regimen and order a BMP.   Follow-up was planned for 6 months with Dr. Jens Somrenshaw.  She presented to the clinic 10/18/20 for follow-up evaluation stated she had noticed that she was more fatigued over the last several months.  She noticed that she continued to need her oxygen.  She reported that when she was asleep at night and her oxygen would fall off her saturation will drop into the 70s.   She was maintaining a heart healthy low-sodium diet.  She reported that she did have some lower extremity numbness and that she had a herniated lumbar disc.  She continued to have some right-sided/shoulder area discomfort as well as left-sided shoulder discomfort.  Appeared to be related to deconditioning and musculoskeletal pain related to the use of her walker.  Her weight had increased  about 10-11 pounds.  She denied  bleeding issues.  She performed a walking test without her oxygen and her oxygen saturation dropped to 87 after only ambulating 10-12 feet.  I ordered a sleep study, sent my note to her insurance company so that she may maintain her oxygen therapy, asked to her increase her physical activity, repeated an echocardiogram, and planned follow-up after her sleep study.  We reviewed the  importance of weight loss and increasing physical activity.  I recommended that she start doing chair type exercises multiple times per day.  She was seen in follow-up by Dr. Jens Som on 03/28/2021.  Her echocardiogram 10/22 showed normal LV function, mid left ventricular enlargement, mild left ventricular hypertrophy.  She reported multiple complaints.  She noted dyspnea on exertion.  She described bilateral lower extremity edema which was worse with her legs down.  She noted occasional pain in her right chest that was unlike her previous infarct pain.  She denied syncope.  Her Lasix was continued.  She was instructed to follow-up with pulmonology for her chronic dyspnea/respiratory failure and for possible sleep apnea.  Her rosuvastatin was increased to 40 mg daily with plan to repeat lipids and LFTs in 8 weeks.  She presents to the clinic today for follow-up evaluation states***   Today she denies chest pain, shortness of breath, lower extremity edema, fatigue, palpitations, melena, hematuria, hemoptysis, diaphoresis, weakness, presyncope, syncope, orthopnea, and PND.    Past Medical History:  Diagnosis Date   CAD (coronary artery disease)    a. s/p NSTEMI, LHC 03/2020 dRCA 25%, m-LAD-1 95%-0% (DES), mLAD-2 50%, LVEDP moderately elevated, 27 mmHg.   Chronic diastolic heart failure (HCC)    a. LHC/echo 3/22 c/ elevated LVEDP, EF 50-55%, hypokinesis; b. Echo 10/22 EF 60-65%, mild LVH, borderline dilatation of ascending aorta, 45mm.   DDD (degenerative disc disease), lumbar    Diabetes mellitus without complication (HCC)    GERD (gastroesophageal reflux disease)    Hyperlipidemia    Hypertension    Hypothyroidism    Lumbar herniated disc    Major depressive disorder    Morbid obesity (HCC)     Past Surgical History:  Procedure Laterality Date   CORONARY STENT INTERVENTION N/A 03/28/2020   Procedure: CORONARY STENT INTERVENTION;  Surgeon: Corky Crafts, MD;  Location: [facility] INVASIVE CV  LAB;  Service: Cardiovascular;  Laterality: N/A;   DILATION AND CURETTAGE OF UTERUS     INCISION AND DRAINAGE PERIRECTAL ABSCESS  06/21/2014   INCISION AND DRAINAGE PERIRECTAL ABSCESS Bilateral 06/21/2014   Procedure: IRRIGATION AND DEBRIDEMENT PERIRECTAL ABSCESS;  Surgeon: Abigail Miyamoto, MD;  Location: [facility] OR;  Service: General;  Laterality: Bilateral;   LEFT HEART CATH AND CORONARY ANGIOGRAPHY N/A 03/28/2020   Procedure: LEFT HEART CATH AND CORONARY ANGIOGRAPHY;  Surgeon: Corky Crafts, MD;  Location: [facility] INVASIVE CV LAB;  Service: Cardiovascular;  Laterality: N/A;    Current Medications: No outpatient medications have been marked as taking for the 07/20/21 encounter (Appointment) with Ronney Asters, NP.     Allergies:   Irbesartan, Clindamycin/lincomycin, Latex, and Ultram [tramadol]   Social History   Socioeconomic History   Marital status: Single    Spouse name: Not on file   Number of children: Not on file   Years of education: Not on file   Highest education level: Not on file  Occupational History   Not on file  Tobacco Use   Smoking status: Never   Smokeless tobacco: Never  Vaping Use  Vaping Use: Never used  Substance and Sexual Activity   Alcohol use: No   Drug use: No   Sexual activity: Not on file  Other Topics Concern   Not on file  Social History Narrative   Not on file   Social Determinants of Health   Financial Resource Strain: Medium Risk (03/29/2020)   Overall Financial Resource Strain (CARDIA)    Difficulty of Paying Living Expenses: Somewhat hard  Food Insecurity: No Food Insecurity (03/29/2020)   Hunger Vital Sign    Worried About Running Out of Food in the Last Year: Never true    Ran Out of Food in the Last Year: Never true  Transportation Needs: No Transportation Needs (03/29/2020)   PRAPARE - Administrator, Civil Service (Medical): No    Lack of Transportation (Non-Medical): No  Physical Activity: Inactive (03/29/2020)    Exercise Vital Sign    Days of Exercise per Week: 0 days    Minutes of Exercise per Session: 0 min  Stress: Not on file  Social Connections: Not on file     Family History: The patient's family history includes Diabetes in her father and mother. There is no history of Heart disease.  ROS:   Please see the history of present illness.     All other systems reviewed and are negative.   Risk Assessment/Calculations:           Physical Exam:    VS:  There were no vitals taken for this visit.    Wt Readings from Last 3 Encounters:  05/31/21 (!) 364 lb 3.2 oz (165.2 kg)  04/06/21 (!) 346 lb 5.5 oz (157.1 kg)  03/28/21 (!) 346 lb 6.4 oz (157.1 kg)     GEN:  Well nourished, well developed in no acute distress HEENT: Normal NECK: No JVD; No carotid bruits LYMPHATICS: No lymphadenopathy CARDIAC: RRR, no murmurs, rubs, gallops RESPIRATORY:  Clear to auscultation without rales, wheezing or rhonchi  ABDOMEN: Soft, non-tender, non-distended MUSCULOSKELETAL:  No edema; No deformity  SKIN: Warm and dry NEUROLOGIC:  Alert and oriented x 3 PSYCHIATRIC:  Normal affect    EKGs/Labs/Other Studies Reviewed:    The following studies were reviewed today:  Echocardiogram 03/28/2020 IMPRESSIONS     1. Overall poor image quality even with definity Septal and apical  hypokinesis . Left ventricular ejection fraction, by estimation, is 50 to  55%. The left ventricle has low normal function. The left ventricle has no  regional wall motion abnormalities.  Left ventricular diastolic parameters were normal.   2. Right ventricular systolic function is normal. The right ventricular  size is normal.   3. Left atrial size was mildly dilated.   4. The mitral valve is normal in structure. No evidence of mitral valve  regurgitation. No evidence of mitral stenosis.   5. The aortic valve was not well visualized. Aortic valve regurgitation  is not visualized. No aortic stenosis is present.   6. The  inferior vena cava is normal in size with greater than 50%  respiratory variability, suggesting right atrial pressure of 3 mmHg.   Echocardiogram 11/10/2020  IMPRESSIONS     1. Left ventricular ejection fraction, by estimation, is 60 to 65%. The  left ventricle has normal function. The left ventricle has no regional  wall motion abnormalities. The left ventricular internal cavity size was  mildly dilated. There is mild left  ventricular hypertrophy. Left ventricular diastolic parameters were  normal.   2. Right ventricular systolic function  is normal. The right ventricular  size is normal.   3. The mitral valve is normal in structure. Trivial mitral valve  regurgitation. No evidence of mitral stenosis.   4. The aortic valve is normal in structure. Aortic valve regurgitation is  not visualized. No aortic stenosis is present.   5. Aortic dilatation noted. There is borderline dilatation of the  ascending aorta, measuring 39 mm.   6. The inferior vena cava is normal in size with greater than 50%  respiratory variability, suggesting right atrial pressure of 3 mmHg.   Comparison(s): EF 50 %, hypokinesis.  Cardiac catheterization 03/28/2020 Dist RCA lesion is 25% stenosed. Mid LAD-1 lesion is 95% stenosed. A drug-eluting stent was successfully placed using a STENT RESOLUTE ONYX [id number]. The proximal stent was postdilated with a 3.75 mm Warm [place] baloon. Post intervention, there is a 0% residual stenosis. Mid LAD-2 lesion is 50% stenosed. LV end diastolic pressure is moderately elevated. LVEDP 27 mm Hg. There is no aortic valve stenosis.   Continue dual antiplatelet therapy for 12 months. Consider clopidogrel monotherapy after 12 months, along with aggressive secondary prevention.     Increase statin to high potency.     Continue with plans for diuresis.  D/w Dr. Jens Som.  Diagnostic Dominance: Right     Intervention          EKG: None today.  Recent Labs: 04/06/2021: B  Natriuretic Peptide 22.8 04/07/2021: Magnesium 1.8 04/09/2021: Hemoglobin 10.8; Platelets 187 05/31/2021: ALT 13; BUN 25; Creatinine, Ser 0.91; Potassium 4.0; Sodium 142; TSH 2.98  Recent Lipid Panel    Component Value Date/Time   CHOL 121 05/31/2021 1220   TRIG 175.0 (H) 05/31/2021 1220   HDL 38.60 (L) 05/31/2021 1220   CHOLHDL 3 05/31/2021 1220   VLDL 35.0 05/31/2021 1220   LDLCALC 48 05/31/2021 1220    ASSESSMENT & PLAN   Chronic diastolic CHF-weight stable.  Euvolemic.   Continues to have dyspnea with increased physical activity.   Continue spironolactone, potassium, furosemide, metoprolol Heart healthy low-sodium diet-salty 6 given Increase physical activity as tolerated Daily weights-contact office with any increase of 3 pounds overnight or 5 pounds in 1 week-reviewed Continue lower extremity support stockings Elevate lower extremities when not active.  Fatigue/DOE-stable.  Continues with 2 L of oxygen saturating in the high to mid 90s.  Previously discussed obstructive sleep apnea sleep study.  Patient will follow-up with pulmonology.   Continue 2 L of O2 Follows with pulmonology  Coronary artery disease-no chest pain today.  Continues with chronic right-sided chest discomfort.  Not felt to be related to cardiac issues.  Reassured that this was musculoskeletal in nature.    Cardiac catheterization 03/28/2020 which showed LAD lesions x2.  She received mid LAD PCI and DES x1.  Details above. Continue aspirin,  rosuvastatin, metoprolol Heart healthy low-sodium diet-salty 6 given Increase physical activity as tolerated May stop Plavix.   Essential hypertension-BP today ***139/58.  Well-controlled at home. Continue metoprolol, spironolactone Heart healthy low-sodium diet-salty 6 given Increase physical activity as tolerated    Hyperlipidemia-03/28/2020: Cholesterol 106; HDL 27; LDL Cholesterol 57; Triglycerides 110; VLDL 22 Continue rosuvastatin Heart healthy low-sodium  high-fiber diet Increase physical activity as tolerated Repeat fasting lipids and LFTs   Type 2 diabetes- glucose 117 on 05/31/21 Continue Novolin, metformin Follows with PCP   Morbid obesity-weight today ***361 pounds.  Continues to be limited her mobility due to low back pain and lower extremity numbness. Continue weight loss Follows with PCP   Disposition: Follow-up  with Dr. Jens Som in 6-9 months.        Medication Adjustments/Labs and Tests Ordered: Current medicines are reviewed at length with the patient today.  Concerns regarding medicines are outlined above.  No orders of the defined types were placed in this encounter.  No orders of the defined types were placed in this encounter.    There are no Patient Instructions on file for this visit.   Signed, Ronney Asters, NP  07/17/2021 9:16 AM      Notice: This dictation was prepared with Dragon dictation along with smaller phrase technology. Any transcriptional errors that result from this process are unintentional and may not be corrected upon review.  I spent 14*** minutes examining this patient, reviewing medications, and using patient centered shared decision making involving her cardiac care.  Prior to her visit I spent greater than 20 minutes reviewing her past medical history,  medications, and prior cardiac tests.

## 2021-07-20 ENCOUNTER — Ambulatory Visit: Payer: BC Managed Care – PPO | Admitting: General Practice

## 2021-07-20 ENCOUNTER — Telehealth: Payer: Self-pay

## 2021-07-21 ENCOUNTER — Other Ambulatory Visit: Payer: Self-pay

## 2021-07-21 ENCOUNTER — Encounter (HOSPITAL_COMMUNITY): Payer: Self-pay

## 2021-07-21 ENCOUNTER — Emergency Department (HOSPITAL_COMMUNITY)
Admission: EM | Admit: 2021-07-21 | Discharge: 2021-07-22 | Disposition: A | Discharge status: Home / Self Care | Payer: BC Managed Care – PPO | Attending: Emergency Medicine | Admitting: Emergency Medicine

## 2021-07-21 ENCOUNTER — Emergency Department (HOSPITAL_COMMUNITY): Payer: BC Managed Care – PPO

## 2021-07-21 DIAGNOSIS — I110 Hypertensive heart disease with heart failure: Secondary | ICD-10-CM | POA: Diagnosis not present

## 2021-07-21 DIAGNOSIS — R079 Chest pain, unspecified: Secondary | ICD-10-CM | POA: Diagnosis not present

## 2021-07-21 DIAGNOSIS — R0602 Shortness of breath: Secondary | ICD-10-CM | POA: Diagnosis not present

## 2021-07-21 DIAGNOSIS — I509 Heart failure, unspecified: Secondary | ICD-10-CM | POA: Diagnosis not present

## 2021-07-21 DIAGNOSIS — R059 Cough, unspecified: Secondary | ICD-10-CM | POA: Diagnosis not present

## 2021-07-21 DIAGNOSIS — R7309 Other abnormal glucose: Secondary | ICD-10-CM | POA: Diagnosis not present

## 2021-07-21 DIAGNOSIS — R531 Weakness: Secondary | ICD-10-CM | POA: Diagnosis not present

## 2021-07-21 LAB — TROPONIN I (HIGH SENSITIVITY): Troponin I (High Sensitivity): 8 ng/L (ref <18)

## 2021-07-21 LAB — BASIC METABOLIC PANEL
Anion gap: 11 (ref 5–15)
BUN: 20 mg/dL (ref 6–20)
CO2: 28 mmol/L (ref 22–32)
Calcium: 8.8 mg/dL — ABNORMAL LOW (ref 8.9–10.3)
Chloride: 101 mmol/L (ref 98–111)
Creatinine, Ser: 1.07 mg/dL — ABNORMAL HIGH (ref 0.44–1.00)
GFR, Estimated: >60 mL/min (ref >60)
Glucose, Bld: 148 mg/dL — ABNORMAL HIGH (ref 70–99)
Potassium: 4.3 mmol/L (ref 3.5–5.1)
Sodium: 140 mmol/L (ref 135–145)

## 2021-07-21 LAB — CBC
HCT: 33.2 % — ABNORMAL LOW (ref 36.0–46.0)
Hemoglobin: 10.2 g/dL — ABNORMAL LOW (ref 12.0–15.0)
MCH: 26.4 pg (ref 26.0–34.0)
MCHC: 30.7 g/dL (ref 30.0–36.0)
MCV: 86 fL (ref 80.0–100.0)
Platelets: 207 10*3/uL (ref 150–400)
RBC: 3.86 MIL/uL — ABNORMAL LOW (ref 3.87–5.11)
RDW: 15.1 % (ref 11.5–15.5)
WBC: 9 10*3/uL (ref 4.0–10.5)
nRBC: 0 % (ref 0.0–0.2)

## 2021-07-21 LAB — I-STAT BETA HCG BLOOD, ED (MC, WL, AP ONLY): I-stat hCG, quantitative: <5 m[IU]/mL (ref <5)

## 2021-07-22 LAB — BRAIN NATRIURETIC PEPTIDE: B Natriuretic Peptide: 50.6 pg/mL (ref 0.0–100.0)

## 2021-07-22 LAB — CBG MONITORING, ED
Glucose-Capillary: 48 mg/dL — ABNORMAL LOW (ref 70–99)
Glucose-Capillary: 88 mg/dL (ref 70–99)

## 2021-07-22 LAB — TROPONIN I (HIGH SENSITIVITY): Troponin I (High Sensitivity): 8 ng/L (ref <18)

## 2021-07-22 MED ORDER — DEXTROSE 50 % IV SOLN
INTRAVENOUS | Status: AC
  Administered 2021-07-22: 50 mL
  Filled 2021-07-22: qty 50

## 2021-07-22 MED ORDER — OXYBUTYNIN CHLORIDE 5 MG PO TABS
5.0000 mg | ORAL_TABLET | Freq: Three times a day (TID) | ORAL | Status: DC
Start: 2021-07-22 — End: 2021-07-22
  Administered 2021-07-22: 5 mg via ORAL
  Filled 2021-07-22: qty 1

## 2021-07-22 MED ORDER — FENTANYL CITRATE PF 50 MCG/ML IJ SOSY
50.0000 ug | PREFILLED_SYRINGE | Freq: Once | INTRAMUSCULAR | Status: AC
  Administered 2021-07-22: 50 ug via INTRAVENOUS
  Filled 2021-07-22: qty 1

## 2021-07-22 MED ORDER — OXYBUTYNIN CHLORIDE ER 5 MG PO TB24: 5.0000 mg | ORAL_TABLET | Freq: Every day | ORAL | 0 refills | Status: DC

## 2021-07-22 MED ORDER — FUROSEMIDE 10 MG/ML IJ SOLN
40.0000 mg | INTRAMUSCULAR | Status: AC
  Administered 2021-07-22: 40 mg via INTRAVENOUS
  Filled 2021-07-22: qty 4

## 2021-07-23 ENCOUNTER — Other Ambulatory Visit (HOSPITAL_COMMUNITY): Payer: Self-pay

## 2021-07-23 NOTE — Telephone Encounter (Addendum)
[name] $784.69 - copay savings card will take off up to $175 for a 30 day supply [employer] not covered Jardiance $581.96  - copay savings card will take off up to $175 for a 30 day supply Pt has a high deductible

## 2021-08-01 ENCOUNTER — Other Ambulatory Visit: Payer: Self-pay | Admitting: General Practice

## 2021-08-03 ENCOUNTER — Other Ambulatory Visit: Payer: BC Managed Care – PPO

## 2021-08-07 ENCOUNTER — Ambulatory Visit: Payer: BC Managed Care – PPO | Admitting: Endocrinology

## 2021-08-18 NOTE — Progress Notes (Unsigned)
Cardiology Office Note:    Date:  08/20/2021   ID:  [name], DOB August 05, 1970, MRN [id number]  PCP:  Elizabeth Palau, FNP   [facility] Providers Cardiologist:  [name], MD      Referring MD: Elizabeth Palau, FNP   Follow-up evaluation of her acute on chronic diastolic CHF, and CAD.  History of Present Illness:    [name] is a 51 y.o. female with a hx of hypertension, GERD, depression, hypothyroidism, morbid obesity, uncontrolled type 2 diabetes, and chronic diastolic CHF.   She was admitted to hospital with chest discomfort and on 03/28/2020.  Her high-sensitivity troponins were elevated at 514 and climbed to over 2000.  Her EKG showed nonspecific changes.  She was diagnosed with NSTEMI and taken to the Cath Lab.  Cardiac catheterization showed 25% distal RCA, mid LAD 95%, mid LAD-to 50%, moderately elevated LVEDP at 27 mmHg.  She received PCI with DES x1 to her mid LAD.  Her echocardiogram at that time showed normal LVEF, septal/apical hypokinesis, normal diastolic function, no valvular abnormalities and normal right ventricular function.   She followed up with heart and vascular transition of care clinic 04/06/2020.  At that time she reported she felt achy since being discharged from the hospital.  She denied chest pain except with deep inhalation.  Her discomfort resolved with exhalation.  She reported overall feeling better.  Her medications were reviewed.  It was noted that her pillbox was not in order, containing days with missing doses of certain medications and multiple doses on other days.  She reported having very poor vision and having difficulty with her pill bottles.   She presented to the clinic 06/06/20 for follow-up evaluation stated she felt fairly well.  She did report that she had increased anxiety related to doctors visits and medical procedures.  She ha noticed occasional brief episodes of sharp chest pain.  She reported that the pain could be  reproduced with pressing over the area.  I educated her that this was related to muscle wall pain.  She expressed understanding.  She reported that she did have occasional brief episodes of palpitations.  She reported that these would happen a couple times a week.  She also reported that she had presented to the emergency department with complaints of lower extremity swelling.  She was instructed to take extra doses of her furosemide.  She reported that she followed a low-sodium diet and restricted her fluid.  She was breathing much better with 2 L of oxygen via nasal cannula.  We gave  her the [place] support stocking sheet, salty 6 diet sheet, have her increase her physical activity as tolerated, continue current medication regimen and order a BMP.   Follow-up was planned for 6 months with Dr. Jens Som.  She presented to the clinic 10/18/20 for follow-up evaluation stated she had noticed that she was more fatigued over the last several months.  She noticed that she continued to need her oxygen.  She reported that when she was asleep at night and her oxygen would fall off her saturation will drop into the 70s.   She was maintaining a heart healthy low-sodium diet.  She reported that she did have some lower extremity numbness and that she had a herniated lumbar disc.  She continued to have some right-sided/shoulder area discomfort as well as left-sided shoulder discomfort.  Appeared to be related to deconditioning and musculoskeletal pain related to the use of her walker.  Her weight had increased  about 10-11 pounds.  She denied  bleeding issues.  She performed a walking test without her oxygen and her oxygen saturation dropped to 87 after only ambulating 10-12 feet.  I ordered a sleep study, sent my note to her insurance company so that she may maintain her oxygen therapy, asked to her increase her physical activity, repeated an echocardiogram, and planned follow-up after her sleep study.  We reviewed the  importance of weight loss and increasing physical activity.  I recommended that she start doing chair type exercises multiple times per day.  She was seen in follow-up by Dr. Stanford Breed on 03/28/2021.  Her echocardiogram 10/22 showed normal LV function, mid left ventricular enlargement, mild left ventricular hypertrophy.  She reported multiple complaints.  She noted dyspnea on exertion.  She described bilateral lower extremity edema which was worse with her legs down.  She noted occasional pain in her right chest that was unlike her previous infarct pain.  She denied syncope.  Her Lasix was continued.  She was instructed to follow-up with pulmonology for her chronic dyspnea/respiratory failure and for possible sleep apnea.  Her rosuvastatin was increased to 40 mg daily with plan to repeat lipids and LFTs in 8 weeks.  She was seen in the emergency department on 07/22/2021.  She complained of shortness of breath and chest discomfort.  She was also noted to have increased lower extremity fluid.  She was wearing 2.5 L nasal cannula and denied fever and chills.  She reported a cough x5 days which was nonproductive.  Her chest x-ray showed no acute cardiopulmonary disease.  Her high-sensitivity troponins were low and flat.  Her BNP was 50.  She received IV Lasix in the emergency department and urinated around 2 L.  She reported that her breathing was significantly improved.  She was advised to continue her home furosemide.  She presents to the clinic today for follow-up evaluation states she continues to use 2 L of oxygen while she is away from home and 2-1/2 L of oxygen at home.  She reports intermittent periods of shortness of breath with laying back.  We reviewed her most recent emergency department visit.  She expressed understanding.  She remains limited in her physical activity due to back pain.  She reports that she was trying to wean oxygen but was unable to titrate down.  We reviewed her echocardiogram and her  pumping function.  She continues to be compliant with fluid restriction.  We reviewed the importance of daily weights and contacting the office with a weight increase of 2 to 3 pounds overnight or 5 pounds in 1 week.  I will refer her to pulmonology for evaluation of her lungs and recommendations for coming off oxygen/treatment.  I reviewed the importance of lower extremity support stockings, she has been tolerating lower extremity wraps and is unable to manage support stockings.  We will plan follow-up in 6 months.   Today she denies chest pain, increased shortness of breath, increased lower extremity edema, fatigue, palpitations, melena, hematuria, hemoptysis, diaphoresis, weakness, presyncope, syncope, orthopnea, and PND.    Past Medical History:  Diagnosis Date   CAD (coronary artery disease)    a. s/p NSTEMI, LHC 03/2020 dRCA 25%, m-LAD-1 95%-0% (DES), mLAD-2 50%, LVEDP moderately elevated, 27 mmHg.   Chronic diastolic heart failure ([place])    a. LHC/echo 3/22 c/ elevated LVEDP, EF 50-55%, hypokinesis; b. Echo 10/22 EF 60-65%, mild LVH, borderline dilatation of ascending aorta, 68m.   DDD (degenerative disc disease), lumbar  Diabetes mellitus without complication (HCC)    GERD (gastroesophageal reflux disease)    Hyperlipidemia    Hypertension    Hypothyroidism    Lumbar herniated disc    Major depressive disorder    Morbid obesity (HCC)     Past Surgical History:  Procedure Laterality Date   CORONARY STENT INTERVENTION N/A 03/28/2020   Procedure: CORONARY STENT INTERVENTION;  Surgeon: Corky Crafts, MD;  Location: [facility] INVASIVE CV LAB;  Service: Cardiovascular;  Laterality: N/A;   DILATION AND CURETTAGE OF UTERUS     INCISION AND DRAINAGE PERIRECTAL ABSCESS  06/21/2014   INCISION AND DRAINAGE PERIRECTAL ABSCESS Bilateral 06/21/2014   Procedure: IRRIGATION AND DEBRIDEMENT PERIRECTAL ABSCESS;  Surgeon: Abigail Miyamoto, MD;  Location: [facility] OR;  Service: General;  Laterality:  Bilateral;   LEFT HEART CATH AND CORONARY ANGIOGRAPHY N/A 03/28/2020   Procedure: LEFT HEART CATH AND CORONARY ANGIOGRAPHY;  Surgeon: Corky Crafts, MD;  Location: [facility] INVASIVE CV LAB;  Service: Cardiovascular;  Laterality: N/A;    Current Medications: Current Meds  Medication Sig   acetaminophen (TYLENOL) 500 MG tablet Take 1,000 mg by mouth every 6 (six) hours as needed for moderate pain or headache.   aspirin EC 81 MG tablet Take 81 mg by mouth daily. Swallow whole.   B Complex-C (SUPER B COMPLEX PO) Take 1 tablet by mouth daily.   Cholecalciferol (VITAMIN D3) 50 MCG (2000 UT) capsule Take 2,000 Units by mouth daily.   Continuous Blood Gluc Sensor (FREESTYLE LIBRE 3 SENSOR) MISC 1 Device by Does not apply route every 14 (fourteen) days. Apply 1 sensor on upper arm every 14 days for continuous glucose monitoring   furosemide (LASIX) 40 MG tablet Take 1 tablet (40 mg total) by mouth daily.   gabapentin (NEURONTIN) 300 MG capsule Take 2 capsules by mouth 3 (three) times daily.   insulin NPH-regular Human (NOVOLIN 70/30 RELION) (70-30) 100 UNIT/ML injection 155 units with breakfast, and 125 units with supper, and needles 1/day (Patient taking differently: 125-150 Units See admin instructions. 150 units with breakfast, and 125 units with supper.)   levothyroxine (SYNTHROID, LEVOTHROID) 50 MCG tablet Take 50 mcg by mouth daily.   metFORMIN (GLUCOPHAGE-XR) 500 MG 24 hr tablet Take 4 tablets (2,000 mg total) by mouth daily. (Patient taking differently: Take 1,000 mg by mouth 2 (two) times daily.)   metoprolol succinate (TOPROL-XL) 50 MG 24 hr tablet TAKE 1 TABLET BY MOUTH ONCE DAILY (TAKE  WITH  OR  IMMEDIATELY  FOLLOWING  A  MEAL)   nystatin cream (MYCOSTATIN) Apply 1 application  topically 2 (two) times daily. Abdominal folds   omeprazole (PRILOSEC) 40 MG capsule Take 40 mg by mouth 2 (two) times daily.   potassium chloride (KLOR-CON) 10 MEQ tablet Take 2 tablets (20 mEq total) by mouth daily.    rosuvastatin (CRESTOR) 40 MG tablet Take 40 mg by mouth daily.   Sennosides 17.2 MG TABS Take 34.4 mg by mouth 3 (three) times a week. No set days     Allergies:   Irbesartan, Clindamycin/lincomycin, Latex, and Ultram [tramadol]   Social History   Socioeconomic History   Marital status: Single    Spouse name: Not on file   Number of children: Not on file   Years of education: Not on file   Highest education level: Not on file  Occupational History   Not on file  Tobacco Use   Smoking status: Never   Smokeless tobacco: Never  Vaping Use   Vaping Use:  Never used  Substance and Sexual Activity   Alcohol use: No   Drug use: No   Sexual activity: Not on file  Other Topics Concern   Not on file  Social History Narrative   Not on file   Social Determinants of Health   Financial Resource Strain: Medium Risk (03/29/2020)   Overall Financial Resource Strain (CARDIA)    Difficulty of Paying Living Expenses: Somewhat hard  Food Insecurity: No Food Insecurity (03/29/2020)   Hunger Vital Sign    Worried About Running Out of Food in the Last Year: Never true    Ran Out of Food in the Last Year: Never true  Transportation Needs: No Transportation Needs (03/29/2020)   PRAPARE - Administrator, Civil ServiceTransportation    Lack of Transportation (Medical): No    Lack of Transportation (Non-Medical): No  Physical Activity: Inactive (03/29/2020)   Exercise Vital Sign    Days of Exercise per Week: 0 days    Minutes of Exercise per Session: 0 min  Stress: Not on file  Social Connections: Not on file     Family History: The patient's family history includes Diabetes in her father and mother. There is no history of Heart disease.  ROS:   Please see the history of present illness.     All other systems reviewed and are negative.   Risk Assessment/Calculations:           Physical Exam:    VS:  BP (!) 122/58   Pulse 74   Ht 5\' 6"  (1.676 m)   Wt (!) 368 lb (166.9 kg)   SpO2 98%   BMI 59.40 kg/m     Wt  Readings from Last 3 Encounters:  08/20/21 (!) 368 lb (166.9 kg)  07/21/21 (!) 360 lb (163.3 kg)  05/31/21 (!) 364 lb 3.2 oz (165.2 kg)     GEN:  Well nourished, well developed in no acute distress HEENT: Normal NECK: No JVD; No carotid bruits LYMPHATICS: No lymphadenopathy CARDIAC: RRR, no murmurs, rubs, gallops generalized bilateral lower extremity edema left greater than right RESPIRATORY:  Clear to auscultation without rales, wheezing or rhonchi  ABDOMEN: Soft, non-tender, non-distended MUSCULOSKELETAL:  No edema; No deformity  SKIN: Warm and dry NEUROLOGIC:  Alert and oriented x 3 PSYCHIATRIC:  Normal affect    EKGs/Labs/Other Studies Reviewed:    The following studies were reviewed today:  Echocardiogram 03/28/2020 IMPRESSIONS     1. Overall poor image quality even with definity Septal and apical  hypokinesis . Left ventricular ejection fraction, by estimation, is 50 to  55%. The left ventricle has low normal function. The left ventricle has no  regional wall motion abnormalities.  Left ventricular diastolic parameters were normal.   2. Right ventricular systolic function is normal. The right ventricular  size is normal.   3. Left atrial size was mildly dilated.   4. The mitral valve is normal in structure. No evidence of mitral valve  regurgitation. No evidence of mitral stenosis.   5. The aortic valve was not well visualized. Aortic valve regurgitation  is not visualized. No aortic stenosis is present.   6. The inferior vena cava is normal in size with greater than 50%  respiratory variability, suggesting right atrial pressure of 3 mmHg.   Echocardiogram 11/10/2020  IMPRESSIONS     1. Left ventricular ejection fraction, by estimation, is 60 to 65%. The  left ventricle has normal function. The left ventricle has no regional  wall motion abnormalities. The left ventricular internal cavity  size was  mildly dilated. There is mild left  ventricular hypertrophy.  Left ventricular diastolic parameters were  normal.   2. Right ventricular systolic function is normal. The right ventricular  size is normal.   3. The mitral valve is normal in structure. Trivial mitral valve  regurgitation. No evidence of mitral stenosis.   4. The aortic valve is normal in structure. Aortic valve regurgitation is  not visualized. No aortic stenosis is present.   5. Aortic dilatation noted. There is borderline dilatation of the  ascending aorta, measuring 39 mm.   6. The inferior vena cava is normal in size with greater than 50%  respiratory variability, suggesting right atrial pressure of 3 mmHg.   Comparison(s): EF 50 %, hypokinesis.  Cardiac catheterization 03/28/2020 Dist RCA lesion is 25% stenosed. Mid LAD-1 lesion is 95% stenosed. A drug-eluting stent was successfully placed using a STENT RESOLUTE ONYX [id number]. The proximal stent was postdilated with a 3.75 mm Schleicher baloon. Post intervention, there is a 0% residual stenosis. Mid LAD-2 lesion is 50% stenosed. LV end diastolic pressure is moderately elevated. LVEDP 27 mm Hg. There is no aortic valve stenosis.   Continue dual antiplatelet therapy for 12 months. Consider clopidogrel monotherapy after 12 months, along with aggressive secondary prevention.     Increase statin to high potency.     Continue with plans for diuresis.  D/w Dr. Jens Som.  Diagnostic Dominance: Right     Intervention          EKG: None today.  Recent Labs: 04/07/2021: Magnesium 1.8 05/31/2021: ALT 13; TSH 2.98 07/21/2021: BUN 20; Creatinine, Ser 1.07; Hemoglobin 10.2; Platelets 207; Potassium 4.3; Sodium 140 07/22/2021: B Natriuretic Peptide 50.6  Recent Lipid Panel    Component Value Date/Time   CHOL 121 05/31/2021 1220   TRIG 175.0 (H) 05/31/2021 1220   HDL 38.60 (L) 05/31/2021 1220   CHOLHDL 3 05/31/2021 1220   VLDL 35.0 05/31/2021 1220   LDLCALC 48 05/31/2021 1220    ASSESSMENT & PLAN   Chronic diastolic CHF-weight  today 368 pounds.  Generalized bilateral lower extremity edema.   Continues to have dyspnea with increased physical activity.   Continue spironolactone, potassium, furosemide, metoprolol Heart healthy low-sodium diet-salty 6 given Increase physical activity as tolerated Daily weights-contact office with any increase of 3 pounds overnight or 5 pounds in 1 week-reviewed Continue lower extremity support stockings Elevate lower extremities when not active.  Fatigue/DOE-stable.  Continues with 2 L of oxygen saturating in the high to mid 90s and 2 and half liters at home.  Patient has been unable to wean oxygen. Continue 2 L of O2 Refer to pulmonology  Coronary artery disease-no chest pain today.  Continues with chronic right-sided chest discomfort.  Not felt to be related to cardiac issues.  Reassured that this was musculoskeletal in nature.    Cardiac catheterization 03/28/2020 which showed LAD lesions x2.  She received mid LAD PCI and DES x1.  Details above. Continue aspirin,  rosuvastatin, metoprolol Heart healthy low-sodium diet-salty 6 given Increase physical activity as tolerated May stop Plavix.   Essential hypertension-BP today 122/58. Well-controlled at home. Continue metoprolol, spironolactone Heart healthy low-sodium diet-salty 6 given Increase physical activity as tolerated    Hyperlipidemia-03/28/2020: Cholesterol 106; HDL 27; LDL Cholesterol 57; Triglycerides 110; VLDL 22 Continue rosuvastatin Heart healthy low-sodium high-fiber diet Increase physical activity as tolerated Repeat fasting lipids and LFTs   Type 2 diabetes- glucose 148 on 07/21/21 Continue Novolin, metformin Follows with PCP   Morbid  obesity-weight today 368 pounds.  Continues to be limited her mobility due to low back pain and lower extremity numbness. Continue weight loss Follows with PCP   Disposition: Follow-up with Dr. Jens Som in 6 months.        Medication Adjustments/Labs and Tests  Ordered: Current medicines are reviewed at length with the patient today.  Concerns regarding medicines are outlined above.  Orders Placed This Encounter  Procedures   Ambulatory referral to Pulmonology   No orders of the defined types were placed in this encounter.    There are no Patient Instructions on file for this visit.   Signed, Ronney Asters, NP  08/20/2021 4:04 PM      Notice: This dictation was prepared with Dragon dictation along with smaller phrase technology. Any transcriptional errors that result from this process are unintentional and may not be corrected upon review.  I spent 15 minutes examining this patient, reviewing medications, and using patient centered shared decision making involving her cardiac care.  Prior to her visit I spent greater than 20 minutes reviewing her past medical history,  medications, and prior cardiac tests.

## 2021-08-20 ENCOUNTER — Ambulatory Visit (INDEPENDENT_AMBULATORY_CARE_PROVIDER_SITE_OTHER): Payer: BC Managed Care – PPO | Admitting: General Practice

## 2021-08-20 ENCOUNTER — Encounter: Payer: Self-pay | Admitting: General Practice

## 2021-08-20 VITALS — BP 122/58 | HR 74 | Ht 66.0 in | Wt 368.0 lb

## 2021-08-20 DIAGNOSIS — R5383 Other fatigue: Secondary | ICD-10-CM | POA: Diagnosis not present

## 2021-08-20 DIAGNOSIS — I2510 Atherosclerotic heart disease of native coronary artery without angina pectoris: Secondary | ICD-10-CM | POA: Diagnosis not present

## 2021-08-20 DIAGNOSIS — I5032 Chronic diastolic (congestive) heart failure: Secondary | ICD-10-CM | POA: Diagnosis not present

## 2021-08-20 DIAGNOSIS — R0609 Other forms of dyspnea: Secondary | ICD-10-CM

## 2021-08-20 DIAGNOSIS — E785 Hyperlipidemia, unspecified: Secondary | ICD-10-CM

## 2021-08-20 DIAGNOSIS — I10 Essential (primary) hypertension: Secondary | ICD-10-CM

## 2021-08-20 DIAGNOSIS — E118 Type 2 diabetes mellitus with unspecified complications: Secondary | ICD-10-CM

## 2021-08-20 NOTE — Patient Instructions (Signed)
Medication Instructions:  The current medical regimen is effective;  continue present plan and medications as directed. Please refer to the Current Medication list given to you today.   *If you need a refill on your cardiac medications before your next appointment, please call your pharmacy*  Lab Work:   Testing/Procedures:  NONE    NONE  If you have labs (blood work) drawn today and your tests are completely normal, you will receive your results only by: [place] (if you have MyChart) OR  A paper copy in the mail If you have any lab test that is abnormal or we need to change your treatment, we will call you to review the results.  Special Instructions PLEASE READ AND FOLLOW CHAIR EXERCISES-ATTACHED  PLEASE INCREASE PHYSICAL ACTIVITY AS TOLERATED   TAKE AND LOG YOUR WEIGHT DAILY  FLUID RESTRICTION=ONLY 48 [address] DAILY  WRAP YOUR LEGS DAILY  REFERRAL TO PULMONARY FOR SHORTNESS OF BREATH-EXERTIONAL   Follow-Up: Your next appointment:  6 month(s) In Person with Kirk Ruths, MD   Please call our office 2 months in advance to schedule this appointment  :1  At [facility], you and your health needs are our priority.  As part of our continuing mission to provide you with exceptional heart care, we have created designated Provider Care Teams.  These Care Teams include your primary Cardiologist (physician) and Advanced Practice Providers (APPs -  Physician Assistants and Nurse Practitioners) who all work together to provide you with the care you need, when you need it.  Important Information About Sugar             6 SALTY THINGS TO AVOID     1,$[id number]$  DAILY      Exercises to do While Sitting  Exercises that you do while sitting (chair exercises) can give you many of the same benefits as full exercise. Benefits include strengthening your heart, burning calories, and keeping muscles and joints healthy. Exercise can also improve your mood and help with depression and  anxiety. You may benefit from chair exercises if you are unable to do standing exercises due to: Diabetic foot pain. Obesity. Illness. Arthritis. Recovery from surgery or injury. Breathing problems. Balance problems. Another type of disability. Before starting chair exercises, check with your health care provider or a physical therapist to find out how much exercise you can tolerate and which exercises are safe for you. If your health care provider approves: Start out slowly and build up over time. Aim to work up to about 10-20 minutes for each exercise session. Make exercise part of your daily routine. Drink water when you exercise. Do not wait until you are thirsty. Drink every 10-15 minutes. Stop exercising right away if you have pain, nausea, shortness of breath, or dizziness. If you are exercising in a wheelchair, make sure to lock the wheels. Ask your health care provider whether you can do tai chi or yoga. Many positions in these mind-body exercises can be modified to do while seated. Warm-up Before starting other exercises: Sit up as straight as you can. Have your knees bent at 90 degrees, which is the shape of the capital letter "L." Keep your feet flat on the floor. Sit at the front edge of your chair, if you can. Pull in (tighten) the muscles in your abdomen and stretch your spine and neck as straight as you can. Hold this position for a few minutes. Breathe in and out evenly. Try to concentrate on your breathing, and relax your  mind. Stretching Exercise A: Arm stretch Hold your arms out straight in front of your body. Bend your hands at the wrist with your fingers pointing up, as if signaling someone to stop. Notice the slight tension in your forearms as you hold the position. Keeping your arms out and your hands bent, rotate your hands outward as far as you can and hold this stretch. Aim to have your thumbs pointing up and your pinkie fingers pointing down. Slowly repeat arm  stretches for one minute as tolerated. Exercise B: Leg stretch If you can move your legs, try to "draw" letters on the floor with the toes of your foot. Write your name with one foot. Write your name with the toes of your other foot. Slowly repeat the movements for one minute as tolerated. Exercise C: Reach for the sky Reach your hands as far over your head as you can to stretch your spine. Move your hands and arms as if you are climbing a rope. Slowly repeat the movements for one minute as tolerated. Range of motion exercises Exercise A: Shoulder roll Let your arms hang loosely at your sides. Lift just your shoulders up toward your ears, then let them relax back down. When your shoulders feel loose, rotate your shoulders in backward and forward circles. Do shoulder rolls slowly for one minute as tolerated. Exercise B: March in place As if you are marching, pump your arms and lift your legs up and down. Lift your knees as high as you can. If you are unable to lift your knees, just pump your arms and move your ankles and feet up and down. March in place for one minute as tolerated. Exercise C: Seated jumping jacks Let your arms hang down straight. Keeping your arms straight, lift them up over your head. Aim to point your fingers to the ceiling. While you lift your arms, straighten your legs and slide your heels along the floor to your sides, as wide as you can. As you bring your arms back down to your sides, slide your legs back together. If you are unable to use your legs, just move your arms. Slowly repeat seated jumping jacks for one minute as tolerated. Strengthening exercises Exercise A: Shoulder squeeze Hold your arms straight out from your body to your sides, with your elbows bent and your fists pointed at the ceiling. Keeping your arms in the bent position, move them forward so your elbows and forearms meet in front of your face. Open your arms back out as wide as you can with  your elbows still bent, until you feel your shoulder blades squeezing together. Hold for 5 seconds. Slowly repeat the movements forward and backward for one minute as tolerated. Contact a health care provider if: You have to stop exercising due to any of the following: Pain. Nausea. Shortness of breath. Dizziness. Fatigue. You have significant pain or soreness after exercising. Get help right away if: You have chest pain. You have difficulty breathing. These symptoms may represent a serious problem that is an emergency. Do not wait to see if the symptoms will go away. Get medical help right away. Call your local emergency services (911 in the [place] Do not drive yourself to the hospital. Summary Exercises that you do while sitting (chair exercises) can strengthen your heart, burn calories, and keep muscles and joints healthy. You may benefit from chair exercises if you are unable to do standing exercises due to diabetic foot pain, obesity, recovery from surgery or injury,  or other conditions. Before starting chair exercises, check with your health care provider or a physical therapist to find out how much exercise you can tolerate and which exercises are safe for you. This information is not intended to replace advice given to you by your health care provider. Make sure you discuss any questions you have with your health care provider. Document Revised: 03/12/2020 Document Reviewed: 03/12/2020 Elsevier Patient Education  [employer].

## 2021-08-21 ENCOUNTER — Ambulatory Visit (INDEPENDENT_AMBULATORY_CARE_PROVIDER_SITE_OTHER): Payer: BC Managed Care – PPO | Admitting: Endocrinology

## 2021-08-21 ENCOUNTER — Encounter: Payer: Self-pay | Admitting: Endocrinology

## 2021-08-21 VITALS — BP 124/78 | HR 71

## 2021-08-21 DIAGNOSIS — E1165 Type 2 diabetes mellitus with hyperglycemia: Secondary | ICD-10-CM

## 2021-08-21 DIAGNOSIS — Z794 Long term (current) use of insulin: Secondary | ICD-10-CM | POA: Diagnosis not present

## 2021-08-21 LAB — MICROALBUMIN / CREATININE URINE RATIO
Creatinine,U: 157.6 mg/dL
Microalb Creat Ratio: 7.8 mg/g (ref 0.0–30.0)
Microalb, Ur: 12.2 mg/dL — ABNORMAL HIGH (ref 0.0–1.9)

## 2021-08-21 NOTE — Progress Notes (Signed)
Subjective:    Patient ID: [name], female    DOB: April 30, 1970, 51 y.o.   MRN: [id number]  HPI    Diagnosis date: age 71, 12  Previous history:  Initially was treated with Glucophage alone and not clear if she took other medications also Presumably about 8 years ago she was started on insulin likely because of poor control A1c has been as high as 12% in the past  Recent history:     Non-insulin hypoglycemic drugs: Metformin ER 500 mg, 2 g daily     Insulin regimen: Novolin 70/30,  50-100 units once a day    Side effects from medications: Nausea from high-dose metformin  Most recent A1c was 7.2 as of 5/23  Current self management, blood sugar patterns and problems identified:  She has not been able to get [place] because of high cost and insurance deductible and does not qualify for manufactures patient assistance program since reportedly her [employer] has an assistance program She says she has been more depressed and withdrawn and has been having very irregular meals, inconsistent food intake is also insulin She will only take insulin when she is ready to eat which can be at any time and will take 50 to 100 units of insulin based on her blood sugar level at that time Blood sugar monitoring has been sporadic Recently most of her blood sugars are high with only 1 good reading late afternoon As before she is not able to do much physical activity She does have the freestyle libre sensor but does not know how to start using it   Diet management: She thinks she is eating healthy meals and avoiding all simple sugars      Monitors blood glucose: 3x a day.    Glucometer: [employer].           Blood Glucose readings from review of monitor:    PRE-MEAL Fasting Lunch Dinner Bedtime Overall  Glucose range: 211, 210 223-265 118, 192    Mean/median:     210   POST-MEAL PC Breakfast PC Lunch PC Dinner  Glucose range:   ?  Mean/median:      Previously:  PRE-MEAL  Fasting Lunch Dinner Bedtime Overall  Glucose range: 79-113   100-130   Mean/median:        POST-MEAL PC Breakfast PC Lunch PC Dinner  Glucose range:  125-130   Mean/median:        Dietician visit: Years ago  Weight control:  Wt Readings from Last 3 Encounters:  08/20/21 (!) 368 lb (166.9 kg)  07/21/21 (!) 360 lb (163.3 kg)  05/31/21 (!) 364 lb 3.2 oz (165.2 kg)       Complications:    CAD, Charcot foot, DR, and PN  Lab Results  Component Value Date   HGBA1C 7.2 (A) 05/31/2021   HGBA1C 9.2 (A) 02/27/2021   HGBA1C 9.7 (A) 01/01/2021   Lab Results  Component Value Date   MICROALBUR 12.2 (H) 08/21/2021   LDLCALC 48 05/31/2021   CREATININE 1.07 (H) 07/21/2021      Past Medical History:  Diagnosis Date   CAD (coronary artery disease)    a. s/p NSTEMI, LHC 03/2020 dRCA 25%, m-LAD-1 95%-0% (DES), mLAD-2 50%, LVEDP moderately elevated, 27 mmHg.   Chronic diastolic heart failure (HCC)    a. LHC/echo 3/22 c/ elevated LVEDP, EF 50-55%, hypokinesis; b. Echo 10/22 EF 60-65%, mild LVH, borderline dilatation of ascending aorta, 43mm.   DDD (degenerative disc disease), lumbar  Diabetes mellitus without complication (HCC)    GERD (gastroesophageal reflux disease)    Hyperlipidemia    Hypertension    Hypothyroidism    Lumbar herniated disc    Major depressive disorder    Morbid obesity (HCC)     Past Surgical History:  Procedure Laterality Date   CORONARY STENT INTERVENTION N/A 03/28/2020   Procedure: CORONARY STENT INTERVENTION;  Surgeon: Corky Crafts, MD;  Location: [facility] INVASIVE CV LAB;  Service: Cardiovascular;  Laterality: N/A;   DILATION AND CURETTAGE OF UTERUS     INCISION AND DRAINAGE PERIRECTAL ABSCESS  06/21/2014   INCISION AND DRAINAGE PERIRECTAL ABSCESS Bilateral 06/21/2014   Procedure: IRRIGATION AND DEBRIDEMENT PERIRECTAL ABSCESS;  Surgeon: Abigail Miyamoto, MD;  Location: [facility] OR;  Service: General;  Laterality: Bilateral;   LEFT HEART CATH AND CORONARY  ANGIOGRAPHY N/A 03/28/2020   Procedure: LEFT HEART CATH AND CORONARY ANGIOGRAPHY;  Surgeon: Corky Crafts, MD;  Location: [facility] INVASIVE CV LAB;  Service: Cardiovascular;  Laterality: N/A;    Social History   Socioeconomic History   Marital status: Single    Spouse name: Not on file   Number of children: Not on file   Years of education: Not on file   Highest education level: Not on file  Occupational History   Not on file  Tobacco Use   Smoking status: Never   Smokeless tobacco: Never  Vaping Use   Vaping Use: Never used  Substance and Sexual Activity   Alcohol use: No   Drug use: No   Sexual activity: Not on file  Other Topics Concern   Not on file  Social History Narrative   Not on file   Social Determinants of Health   Financial Resource Strain: Medium Risk (03/29/2020)   Overall Financial Resource Strain (CARDIA)    Difficulty of Paying Living Expenses: Somewhat hard  Food Insecurity: No Food Insecurity (03/29/2020)   Hunger Vital Sign    Worried About Running Out of Food in the Last Year: Never true    Ran Out of Food in the Last Year: Never true  Transportation Needs: No Transportation Needs (03/29/2020)   PRAPARE - Administrator, Civil Service (Medical): No    Lack of Transportation (Non-Medical): No  Physical Activity: Inactive (03/29/2020)   Exercise Vital Sign    Days of Exercise per Week: 0 days    Minutes of Exercise per Session: 0 min  Stress: Not on file  Social Connections: Not on file  Intimate Partner Violence: Not on file    Current Outpatient Medications on File Prior to Visit  Medication Sig Dispense Refill   acetaminophen (TYLENOL) 500 MG tablet Take 1,000 mg by mouth every 6 (six) hours as needed for moderate pain or headache.     aspirin EC 81 MG tablet Take 81 mg by mouth daily. Swallow whole.     B Complex-C (SUPER B COMPLEX PO) Take 1 tablet by mouth daily.     Cholecalciferol (VITAMIN D3) 50 MCG (2000 UT) capsule Take 2,000  Units by mouth daily.     Continuous Blood Gluc Sensor (FREESTYLE LIBRE 3 SENSOR) MISC 1 Device by Does not apply route every 14 (fourteen) days. Apply 1 sensor on upper arm every 14 days for continuous glucose monitoring 2 each 2   furosemide (LASIX) 40 MG tablet Take 1 tablet (40 mg total) by mouth daily.     gabapentin (NEURONTIN) 300 MG capsule Take 2 capsules by mouth 3 (three) times daily.  insulin NPH-regular Human (NOVOLIN 70/30 RELION) (70-30) 100 UNIT/ML injection 155 units with breakfast, and 125 units with supper, and needles 1/day (Patient taking differently: 125-150 Units See admin instructions. 150 units with breakfast, and 125 units with supper.) 90 mL 3   levothyroxine (SYNTHROID, LEVOTHROID) 50 MCG tablet Take 50 mcg by mouth daily.  3   metFORMIN (GLUCOPHAGE-XR) 500 MG 24 hr tablet Take 4 tablets (2,000 mg total) by mouth daily. (Patient taking differently: Take 1,000 mg by mouth 2 (two) times daily.) 360 tablet 3   metoprolol succinate (TOPROL-XL) 50 MG 24 hr tablet TAKE 1 TABLET BY MOUTH ONCE DAILY (TAKE  WITH  OR  IMMEDIATELY  FOLLOWING  A  MEAL) 90 tablet 3   nystatin cream (MYCOSTATIN) Apply 1 application  topically 2 (two) times daily. Abdominal folds     omeprazole (PRILOSEC) 40 MG capsule Take 40 mg by mouth 2 (two) times daily.     potassium chloride (KLOR-CON) 10 MEQ tablet Take 2 tablets (20 mEq total) by mouth daily. 180 tablet 3   rosuvastatin (CRESTOR) 40 MG tablet Take 40 mg by mouth daily.     Sennosides 17.2 MG TABS Take 34.4 mg by mouth 3 (three) times a week. No set days     No current facility-administered medications on file prior to visit.    Allergies  Allergen Reactions   Irbesartan Other (See Comments)    Dizziness, muscle pain   Clindamycin/Lincomycin Other (See Comments) and Nausea And Vomiting    Stomach pain    Latex Rash   Ultram [Tramadol] Rash    Family History  Problem Relation Age of Onset   Diabetes Mother    Diabetes Father     Heart disease Neg Hx     BP 124/78   Pulse 71   SpO2 95%   Review of Systems  NEUROPATHY: She takes gabapentin for neuropathic pains in the legs  Hypothyroidism: She has had mild hypothyroidism for several years on 50ug levothyroxine managed by her PCP  Lab Results  Component Value Date   TSH 2.98 05/31/2021   TSH 2.04 01/01/2021   TSH 5.423 (H) 03/28/2020   FREET4 0.90 01/01/2021   FREET4 1.02 03/28/2020   LIPIDS: She has been prescribed 40 mg Crestor by her PCP, previously LDL was higher with a lower dose No recent labs available  Lab Results  Component Value Date   CHOL 121 05/31/2021   HDL 38.60 (L) 05/31/2021   LDLCALC 48 05/31/2021   TRIG 175.0 (H) 05/31/2021   CHOLHDL 3 05/31/2021   She is not on any specific treatment for HYPERTENSION  She tends to have chronic nausea and reflux and is taking Prilosec and Phenergan     Objective:   Physical Exam    BP 124/78   Pulse 71   SpO2 95%   Assessment & Plan:   DIABETES with obesity  See HPI for details of her current management, blood sugar patterns and problems identified  A1c is last 7.2 but blood sugars are appearing significantly higher at home now because of difficulty with compliance with various issues and unable to afford [place]  PLAN:  She will be trained on how to use the freestyle libre sensor today by the nurse educator Discussed blood sugar targets both fasting and after meals She needs to take her insulin consistently and likely also needs a bedtime dose unless she has taken a dose in the evening Further adjustment will depend on her blood sugar patterns  She will discuss with her [facility] about getting patient assistance for starting either SGLT2 drugs like Farxiga or Jardiance or Ozempic  Patient Instructions  30 units at bedtime if no insulin within 6 hrs  Call [employer] for assistance  Total visit time including counseling regarding diabetes management, lipid management = 40  minutes   Patient assistance denied for Marcelline Deist, she will need to look at the out-of-pocket expense for Jardiance and Farxiga  Reather Littler

## 2021-08-21 NOTE — Patient Instructions (Addendum)
30 units at bedtime if no insulin within 6 hrs  Call [facility] for assistance

## 2021-09-04 ENCOUNTER — Other Ambulatory Visit: Payer: BC Managed Care – PPO

## 2021-09-06 ENCOUNTER — Ambulatory Visit: Payer: BC Managed Care – PPO | Admitting: Endocrinology

## 2021-10-03 ENCOUNTER — Other Ambulatory Visit: Payer: Self-pay

## 2021-10-03 ENCOUNTER — Telehealth: Payer: Self-pay

## 2021-10-03 NOTE — Telephone Encounter (Signed)
Received oxygen paperwork to continue O2 concentrator, discussed with Edd Fabian, FNP-C he told pt at appointment to have pulmonary fill out and a referral was entered at that time (08-20-2021). Per Jesse's progress note dated 08-20-2021:  " I will refer her to pulmonology for evaluation of her lungs and recommendations for coming off oxygen/treatment."  Called [employer] and notified Remi Deter (representative answered the phone) to forward the Oxygen CMN to [facility] to fill out.informed him I will call and notify this patient.  10-03-2021 @5pm -left detailed message to notify pt that [facility] should fill this form out.

## 2021-10-16 NOTE — Telephone Encounter (Signed)
Received faxed  CMN for pt's O2 concentrator. Called [employer] to discuss fax with "Morey Hummingbird", she is the person's name who is sending the fax on the cover page. When I called "Lelon Frohlich" answered the phone, I asked to speak with "Janace Hoard", Lelon Frohlich stated that she is "off line". Explained circumstances from previous call with "Mikeal Hawthorne". Informed Ann that we are not going to sign CMN for this pt and asked her (once again)to send Gabby a message to contact [facility] and fax this to have them sign CMN. She asked for Dr's name, explained that she will need to contact pt for this. Verbalized understanding.

## 2021-10-18 ENCOUNTER — Telehealth: Payer: Self-pay | Admitting: Cardiology

## 2021-10-18 DIAGNOSIS — R0609 Other forms of dyspnea: Secondary | ICD-10-CM

## 2021-10-18 NOTE — Telephone Encounter (Signed)
Routed to [facility] LPN for follow up regarding referral from 08/20/21

## 2021-10-18 NOTE — Addendum Note (Signed)
Addended by: Waylan Rocher on: 10/18/2021 04:36 PM   Modules accepted: Orders

## 2021-10-18 NOTE — Telephone Encounter (Signed)
Not needed

## 2021-10-18 NOTE — Telephone Encounter (Signed)
Patient is requesting a new referral for a pulmonologist to to never hearing from pulmonology. Patient is hopeful to have this taken care of ASAP. Looks like the currently referral is missing a referred-to department. Please assist.

## 2021-10-29 ENCOUNTER — Ambulatory Visit: Payer: BC Managed Care – PPO | Admitting: Endocrinology

## 2021-10-30 NOTE — Progress Notes (Unsigned)
Synopsis: Referred in October 2023 for dyspnea and chronic hypoxemic respiratory failure.  She has a history significant for coronary artery disease and received stenting of her LAD in 2021 in the setting of a non-ST elevation MI.  At some point after that she was diagnosed with nocturnal hypoxemia and was started on oxygen therapy.  Subjective:   PATIENT ID: [name] GENDER: female DOB: Dec 03, 1970, MRN: [id number]   HPI  No chief complaint on file.   ***  Record review: Cardiology records from July 2023 reviewed with the patient was seen for coronary artery disease, hypertension, and chronic respiratory failure with hypoxemia and was referred to [place] for management of the same.  She was noted to have had normalization of her LVEF on most recent echocardiogram  Past Medical History:  Diagnosis Date   CAD (coronary artery disease)    a. s/p NSTEMI, LHC 03/2020 dRCA 25%, m-LAD-1 95%-0% (DES), mLAD-2 50%, LVEDP moderately elevated, 27 mmHg.   Chronic diastolic heart failure ([place])    a. LHC/echo 3/22 c/ elevated LVEDP, EF 50-55%, hypokinesis; b. Echo 10/22 EF 60-65%, mild LVH, borderline dilatation of ascending aorta, 37mm.   DDD (degenerative disc disease), lumbar    Diabetes mellitus without complication (HCC)    GERD (gastroesophageal reflux disease)    Hyperlipidemia    Hypertension    Hypothyroidism    Lumbar herniated disc    Major depressive disorder    Morbid obesity ([place])      Family History  Problem Relation Age of Onset   Diabetes Mother    Diabetes Father    Heart disease Neg Hx      Social History   Socioeconomic History   Marital status: Single    Spouse name: Not on file   Number of children: Not on file   Years of education: Not on file   Highest education level: Not on file  Occupational History   Not on file  Tobacco Use   Smoking status: Never   Smokeless tobacco: Never  Vaping Use   Vaping Use: Never used  Substance and Sexual Activity    Alcohol use: No   Drug use: No   Sexual activity: Not on file  Other Topics Concern   Not on file  Social History Narrative   Not on file   Social Determinants of Health   Financial Resource Strain: Medium Risk (03/29/2020)   Overall Financial Resource Strain (CARDIA)    Difficulty of Paying Living Expenses: Somewhat hard  Food Insecurity: No Food Insecurity (03/29/2020)   Hunger Vital Sign    Worried About Running Out of Food in the Last Year: Never true    Ran Out of Food in the Last Year: Never true  Transportation Needs: No Transportation Needs (03/29/2020)   PRAPARE - Hydrologist (Medical): No    Lack of Transportation (Non-Medical): No  Physical Activity: Inactive (03/29/2020)   Exercise Vital Sign    Days of Exercise per Week: 0 days    Minutes of Exercise per Session: 0 min  Stress: Not on file  Social Connections: Not on file  Intimate Partner Violence: Not on file     Allergies  Allergen Reactions   Irbesartan Other (See Comments)    Dizziness, muscle pain   Clindamycin/Lincomycin Other (See Comments) and Nausea And Vomiting    Stomach pain    Latex Rash   Ultram [Tramadol] Rash     Outpatient Medications Prior to Visit  Medication Sig Dispense Refill   acetaminophen (TYLENOL) 500 MG tablet Take 1,000 mg by mouth every 6 (six) hours as needed for moderate pain or headache.     aspirin EC 81 MG tablet Take 81 mg by mouth daily. Swallow whole.     B Complex-C (SUPER B COMPLEX PO) Take 1 tablet by mouth daily.     Cholecalciferol (VITAMIN D3) 50 MCG (2000 UT) capsule Take 2,000 Units by mouth daily.     Continuous Blood Gluc Sensor (FREESTYLE LIBRE 3 SENSOR) MISC 1 Device by Does not apply route every 14 (fourteen) days. Apply 1 sensor on upper arm every 14 days for continuous glucose monitoring 2 each 2   furosemide (LASIX) 40 MG tablet Take 1 tablet (40 mg total) by mouth daily.     gabapentin (NEURONTIN) 300 MG capsule Take 2 capsules by  mouth 3 (three) times daily.     insulin NPH-regular Human (NOVOLIN 70/30 RELION) (70-30) 100 UNIT/ML injection 155 units with breakfast, and 125 units with supper, and needles 1/day (Patient taking differently: 125-150 Units See admin instructions. 150 units with breakfast, and 125 units with supper.) 90 mL 3   levothyroxine (SYNTHROID, LEVOTHROID) 50 MCG tablet Take 50 mcg by mouth daily.  3   metFORMIN (GLUCOPHAGE-XR) 500 MG 24 hr tablet Take 4 tablets (2,000 mg total) by mouth daily. (Patient taking differently: Take 1,000 mg by mouth 2 (two) times daily.) 360 tablet 3   metoprolol succinate (TOPROL-XL) 50 MG 24 hr tablet TAKE 1 TABLET BY MOUTH ONCE DAILY (TAKE  WITH  OR  IMMEDIATELY  FOLLOWING  A  MEAL) 90 tablet 3   nystatin cream (MYCOSTATIN) Apply 1 application  topically 2 (two) times daily. Abdominal folds     omeprazole (PRILOSEC) 40 MG capsule Take 40 mg by mouth 2 (two) times daily.     potassium chloride (KLOR-CON) 10 MEQ tablet Take 2 tablets (20 mEq total) by mouth daily. 180 tablet 3   rosuvastatin (CRESTOR) 40 MG tablet Take 40 mg by mouth daily.     Sennosides 17.2 MG TABS Take 34.4 mg by mouth 3 (three) times a week. No set days     No facility-administered medications prior to visit.    ROS    Objective:  Physical Exam   There were no vitals filed for this visit.  ***  CBC    Component Value Date/Time   WBC 9.0 07/21/2021 2026   RBC 3.86 (L) 07/21/2021 2026   HGB 10.2 (L) 07/21/2021 2026   HCT 33.2 (L) 07/21/2021 2026   PLT 207 07/21/2021 2026   MCV 86.0 07/21/2021 2026   MCH 26.4 07/21/2021 2026   MCHC 30.7 07/21/2021 2026   RDW 15.1 07/21/2021 2026   LYMPHSABS 2.3 04/09/2021 0233   MONOABS 0.7 04/09/2021 0233   EOSABS 0.3 04/09/2021 0233   BASOSABS 0.0 04/09/2021 0233     Chest imaging: June 2023 chest x-ray images independently reviewed showing low lung volumes, enlarged cardiac silhouette, no pulmonary parenchymal  abnormality  PFT:  Labs:  Path:  Echo: October 2022 transthoracic echocardiogram LVEF 60 to 65%, mild LVH, RV size and function normal, visualized valves within normal limits.  Heart Catheterization:       Assessment & Plan:   No diagnosis found.  Discussion: *** Sleep study  Immunizations: There is no immunization history for the selected administration types on file for this patient.   Current Outpatient Medications:    acetaminophen (TYLENOL) 500 MG tablet, Take 1,000  mg by mouth every 6 (six) hours as needed for moderate pain or headache., Disp: , Rfl:    aspirin EC 81 MG tablet, Take 81 mg by mouth daily. Swallow whole., Disp: , Rfl:    B Complex-C (SUPER B COMPLEX PO), Take 1 tablet by mouth daily., Disp: , Rfl:    Cholecalciferol (VITAMIN D3) 50 MCG (2000 UT) capsule, Take 2,000 Units by mouth daily., Disp: , Rfl:    Continuous Blood Gluc Sensor (FREESTYLE LIBRE 3 SENSOR) MISC, 1 Device by Does not apply route every 14 (fourteen) days. Apply 1 sensor on upper arm every 14 days for continuous glucose monitoring, Disp: 2 each, Rfl: 2   furosemide (LASIX) 40 MG tablet, Take 1 tablet (40 mg total) by mouth daily., Disp: , Rfl:    gabapentin (NEURONTIN) 300 MG capsule, Take 2 capsules by mouth 3 (three) times daily., Disp: , Rfl:    insulin NPH-regular Human (NOVOLIN 70/30 RELION) (70-30) 100 UNIT/ML injection, 155 units with breakfast, and 125 units with supper, and needles 1/day (Patient taking differently: 125-150 Units See admin instructions. 150 units with breakfast, and 125 units with supper.), Disp: 90 mL, Rfl: 3   levothyroxine (SYNTHROID, LEVOTHROID) 50 MCG tablet, Take 50 mcg by mouth daily., Disp: , Rfl: 3   metFORMIN (GLUCOPHAGE-XR) 500 MG 24 hr tablet, Take 4 tablets (2,000 mg total) by mouth daily. (Patient taking differently: Take 1,000 mg by mouth 2 (two) times daily.), Disp: 360 tablet, Rfl: 3   metoprolol succinate (TOPROL-XL) 50 MG 24 hr tablet, TAKE 1  TABLET BY MOUTH ONCE DAILY (TAKE  WITH  OR  IMMEDIATELY  FOLLOWING  A  MEAL), Disp: 90 tablet, Rfl: 3   nystatin cream (MYCOSTATIN), Apply 1 application  topically 2 (two) times daily. Abdominal folds, Disp: , Rfl:    omeprazole (PRILOSEC) 40 MG capsule, Take 40 mg by mouth 2 (two) times daily., Disp: , Rfl:    potassium chloride (KLOR-CON) 10 MEQ tablet, Take 2 tablets (20 mEq total) by mouth daily., Disp: 180 tablet, Rfl: 3   rosuvastatin (CRESTOR) 40 MG tablet, Take 40 mg by mouth daily., Disp: , Rfl:    Sennosides 17.2 MG TABS, Take 34.4 mg by mouth 3 (three) times a week. No set days, Disp: , Rfl:

## 2021-10-31 ENCOUNTER — Encounter: Payer: Self-pay | Admitting: Pulmonary Disease

## 2021-10-31 ENCOUNTER — Ambulatory Visit (INDEPENDENT_AMBULATORY_CARE_PROVIDER_SITE_OTHER): Payer: BC Managed Care – PPO | Admitting: Pulmonary Disease

## 2021-10-31 VITALS — BP 122/82 | HR 76 | Ht 66.0 in | Wt 370.0 lb

## 2021-10-31 DIAGNOSIS — J9612 Chronic respiratory failure with hypercapnia: Secondary | ICD-10-CM

## 2021-10-31 DIAGNOSIS — R400 Somnolence: Secondary | ICD-10-CM | POA: Diagnosis not present

## 2021-10-31 DIAGNOSIS — J9611 Chronic respiratory failure with hypoxia: Secondary | ICD-10-CM | POA: Diagnosis not present

## 2021-10-31 DIAGNOSIS — I5032 Chronic diastolic (congestive) heart failure: Secondary | ICD-10-CM | POA: Diagnosis not present

## 2021-10-31 DIAGNOSIS — R0602 Shortness of breath: Secondary | ICD-10-CM

## 2021-10-31 LAB — BASIC METABOLIC PANEL
BUN: 27 mg/dL — ABNORMAL HIGH (ref 6–23)
CO2: 33 mEq/L — ABNORMAL HIGH (ref 19–32)
Calcium: 9.1 mg/dL (ref 8.4–10.5)
Chloride: 96 mEq/L (ref 96–112)
Creatinine, Ser: 1.3 mg/dL — ABNORMAL HIGH (ref 0.40–1.20)
GFR: 47.98 mL/min — ABNORMAL LOW (ref >60.00)
Glucose, Bld: 300 mg/dL — ABNORMAL HIGH (ref 70–99)
Potassium: 3.7 mEq/L (ref 3.5–5.1)
Sodium: 138 mEq/L (ref 135–145)

## 2021-10-31 NOTE — Patient Instructions (Signed)
Shortness of breath: Lung function test  Chronic respiratory failure with hypoxemia: Ambulatory oximetry monitoring today  Acute decompensated diastolic heart failure: Increase furosemide to 40 mg daily today Basic metabolic panel and proBNP today Return in 1 week for repeat lab work and visit with a nurse practitioner If your shortness of breath and leg swelling have improved then we will send you back to cardiology for reevaluation of your diuretic management  Daytime somnolence and fatigue: In lab sleep study  We will see you back in 1 week with a nurse practitioner to repeat lab work and see how you are doing with the increased dose of Lasix.

## 2021-10-31 NOTE — Addendum Note (Signed)
Addended by: Valerie Salts on: 10/31/2021 10:23 AM   Modules accepted: Orders

## 2021-11-01 DIAGNOSIS — E113411 Type 2 diabetes mellitus with severe nonproliferative diabetic retinopathy with macular edema, right eye: Secondary | ICD-10-CM | POA: Diagnosis not present

## 2021-11-01 DIAGNOSIS — H25813 Combined forms of age-related cataract, bilateral: Secondary | ICD-10-CM | POA: Diagnosis not present

## 2021-11-01 DIAGNOSIS — E113492 Type 2 diabetes mellitus with severe nonproliferative diabetic retinopathy without macular edema, left eye: Secondary | ICD-10-CM | POA: Diagnosis not present

## 2021-11-01 LAB — PRO B NATRIURETIC PEPTIDE: NT-Pro BNP: 73 pg/mL (ref 0–249)

## 2021-11-07 DIAGNOSIS — G4730 Sleep apnea, unspecified: Secondary | ICD-10-CM | POA: Diagnosis not present

## 2021-11-07 DIAGNOSIS — R0683 Snoring: Secondary | ICD-10-CM | POA: Diagnosis not present

## 2021-11-15 ENCOUNTER — Ambulatory Visit (INDEPENDENT_AMBULATORY_CARE_PROVIDER_SITE_OTHER): Payer: BC Managed Care – PPO

## 2021-11-15 ENCOUNTER — Encounter: Payer: Self-pay | Admitting: Primary Care

## 2021-11-15 ENCOUNTER — Ambulatory Visit (INDEPENDENT_AMBULATORY_CARE_PROVIDER_SITE_OTHER): Payer: BC Managed Care – PPO | Admitting: Primary Care

## 2021-11-15 ENCOUNTER — Telehealth: Payer: Self-pay | Admitting: Primary Care

## 2021-11-15 VITALS — BP 110/70 | HR 70 | Temp 98.4°F | Ht 66.0 in

## 2021-11-15 DIAGNOSIS — I509 Heart failure, unspecified: Secondary | ICD-10-CM

## 2021-11-15 DIAGNOSIS — J9611 Chronic respiratory failure with hypoxia: Secondary | ICD-10-CM

## 2021-11-15 DIAGNOSIS — R400 Somnolence: Secondary | ICD-10-CM

## 2021-11-15 DIAGNOSIS — R0602 Shortness of breath: Secondary | ICD-10-CM | POA: Diagnosis not present

## 2021-11-15 LAB — BASIC METABOLIC PANEL
BUN: 26 mg/dL — ABNORMAL HIGH (ref 6–23)
CO2: 36 mEq/L — ABNORMAL HIGH (ref 19–32)
Calcium: 9.5 mg/dL (ref 8.4–10.5)
Chloride: 96 mEq/L (ref 96–112)
Creatinine, Ser: 1.25 mg/dL — ABNORMAL HIGH (ref 0.40–1.20)
GFR: 50.28 mL/min — ABNORMAL LOW (ref >60.00)
Glucose, Bld: 134 mg/dL — ABNORMAL HIGH (ref 70–99)
Potassium: 3.5 mEq/L (ref 3.5–5.1)
Sodium: 141 mEq/L (ref 135–145)

## 2021-11-15 LAB — BRAIN NATRIURETIC PEPTIDE: Pro B Natriuretic peptide (BNP): 13 pg/mL (ref 0.0–100.0)

## 2021-11-15 NOTE — Assessment & Plan Note (Addendum)
-   Felt most likely related to obesity, deconditioning and heart failure. No clear underlying lung disease at this point.  Spirometry in office today showed mild restriction, FEV1 2.47 (82%), ratio 93. Ordered for full pulmonary function testing.  We will get a routine chest x-ray today.

## 2021-11-15 NOTE — Assessment & Plan Note (Signed)
-   Overnight oximetry test on 11/07/2021 showed patient spent 2 hours and 52 minutes with SPO2 less than 88%.  Needs to continue to use 3 L of oxygen at bedtime and 2L during daytime.  Needs oxygen for survival.  She is awaiting in lab sleep study.  Suspect underlying OSA/OHS.  We have advised that she get a wedge pillow to elevate head of bed 30 degrees while sleeping.

## 2021-11-15 NOTE — Patient Instructions (Addendum)
Recommendations Continue Lasix 40 mg daily until further instruction Continue 3 L of oxygen at bedtime and 2 L during daytime Strongly recommend you get a wedge pillow (look on amaz[employer]) to elevate head while sleeping  We will work on getting sleep study scheduled as soon as possible and renewing oxygen order with [employer]  Orders Spirometry in office re: shortness of breath Chest x-ray and labs today re: shortness of breath  Get a copy of patient's overnight oximetry test  Follow-up Please schedule visit with either Dr. Lake Bells or [facility] NP after he has had sleep study

## 2021-11-15 NOTE — Progress Notes (Signed)
@Patient  ID: [name], female    DOB: [birth year], 51 y.o.   MRN: [id number]  Chief Complaint  Patient presents with   Follow-up    C/o increased sob, bilat. Leg edema worse, sats. Drop when lying down to 60's-70's increased oxygen to 3L when lying down, dry cough    Referring provider: Vicenta Aly, FNP   Synopsis: Referred in October 2023 for dyspnea and chronic hypoxemic respiratory failure.  She has a history significant for coronary artery disease and received stenting of her LAD in 2021 in the setting of a non-ST elevation MI.  At some point after that she was diagnosed with nocturnal hypoxemia and was started on oxygen therapy.   HPI  Chief Complaint  Patient presents with   Consult    Referred by cardiology for DOE. Started after her heart attack in 2021. Currently on 2L of O2 24/7.     [name] says that she is short of breath: > just walking from her chair ot the bathroom > she can't lay flat at all due to orthopnea  > this is associated with severe leg swelling > she notes that 2 years ago her manifestation of her MI was severe dyspne > the oxygen has helped since starting it  > she has a hard time sleeping because she wakes gasping for air > She says she'll feel like she can't take a deep breath in  > she feels that the dyspnea has been worsening  She says that lasix is not making her urinate very much any more.  She is taking 20mg  lasix daily after instructed to adjust it down several months ago.  She was hospitalized in March for this.   Never smoker, no lung problems as a child.  She had some significant second hand smoke.  She has worked in Ambulance person, retail and health care.   She says that she has gained 100 pounds in the last 2 years.    She has a dry cough which she thought was related to lisinopril.  She'll get a tickle in her throat.  She has GERD and takes omeprazole.    She snores, but her boyfriend has not witnessed apneas.   Record  review: Cardiology records from July 2023 reviewed with the patient was seen for coronary artery disease, hypertension, and chronic respiratory failure with hypoxemia and was referred to [place] for management of the same.  She was noted to have had normalization of her LVEF on most recent echocardiogram    11/15/2021- Interim hx  Patient was seen 2 weeks ago by Dr. Lake Bells for shortness of breath and chronic hypoxic respiratory failure felt to be due to to obesity, deconditioning and chronic diastolic heart failure.  Patient's diuretic was increased to 40 mg daily. Unclear if she has underlying lung disease, needs pulmonary function testing.  She is feeling no better.  She continues to have shortness of breath symptoms and leg swelling is no better.  She a dry constant cough. She is waking up gasping at night.  Oxygen levels dropped into the 60-70s while laying flat.  O2 level sitting up is in the 90s on 2 L. She is still taking lasix 40mg  daily. She needs repeat lab work today.She has not completed split-night sleep study or pulmonary function testing as of yet.  Overnight oximetry test on room air on 11/07/2021 showed patient spent 2 hours 52 minutes with O2 less than 88%.  SPO2 low 50% with baseline 86%.   Allergies  Allergen Reactions   Irbesartan Other (See Comments)    Dizziness, muscle pain   Clindamycin/Lincomycin Other (See Comments) and Nausea And Vomiting    Stomach pain    Latex Rash   Ultram [Tramadol] Rash    There is no immunization history for the selected administration types on file for this patient.  Past Medical History:  Diagnosis Date   CAD (coronary artery disease)    a. s/p NSTEMI, LHC 03/2020 dRCA 25%, m-LAD-1 95%-0% (DES), mLAD-2 50%, LVEDP moderately elevated, 27 mmHg.   Chronic diastolic heart failure (HCC)    a. LHC/echo 3/22 c/ elevated LVEDP, EF 50-55%, hypokinesis; b. Echo 10/22 EF 60-65%, mild LVH, borderline dilatation of ascending aorta, 74mm.   DDD  (degenerative disc disease), lumbar    Diabetes mellitus without complication (HCC)    GERD (gastroesophageal reflux disease)    Hyperlipidemia    Hypertension    Hypothyroidism    Lumbar herniated disc    Major depressive disorder    Morbid obesity (HCC)     Tobacco History: Social History   Tobacco Use  Smoking Status Never  Smokeless Tobacco Never   Counseling given: Not Answered   Outpatient Medications Prior to Visit  Medication Sig Dispense Refill   acetaminophen (TYLENOL) 500 MG tablet Take 1,000 mg by mouth every 6 (six) hours as needed for moderate pain or headache.     aspirin EC 81 MG tablet Take 81 mg by mouth daily. Swallow whole.     B Complex-C (SUPER B COMPLEX PO) Take 1 tablet by mouth daily.     Cholecalciferol (VITAMIN D3) 50 MCG (2000 UT) capsule Take 2,000 Units by mouth daily.     Continuous Blood Gluc Sensor (FREESTYLE LIBRE 3 SENSOR) MISC 1 Device by Does not apply route every 14 (fourteen) days. Apply 1 sensor on upper arm every 14 days for continuous glucose monitoring 2 each 2   furosemide (LASIX) 40 MG tablet Take 1 tablet (40 mg total) by mouth daily.     gabapentin (NEURONTIN) 300 MG capsule Take 2 capsules by mouth 3 (three) times daily.     insulin NPH-regular Human (NOVOLIN 70/30 RELION) (70-30) 100 UNIT/ML injection 155 units with breakfast, and 125 units with supper, and needles 1/day (Patient taking differently: 125-150 Units See admin instructions. 150 units with breakfast, and 125 units with supper.) 90 mL 3   levothyroxine (SYNTHROID, LEVOTHROID) 50 MCG tablet Take 50 mcg by mouth daily.  3   metFORMIN (GLUCOPHAGE-XR) 500 MG 24 hr tablet Take 4 tablets (2,000 mg total) by mouth daily. (Patient taking differently: Take 1,000 mg by mouth 2 (two) times daily.) 360 tablet 3   metoprolol succinate (TOPROL-XL) 50 MG 24 hr tablet TAKE 1 TABLET BY MOUTH ONCE DAILY (TAKE  WITH  OR  IMMEDIATELY  FOLLOWING  A  MEAL) 90 tablet 3   nystatin cream  (MYCOSTATIN) Apply 1 application  topically 2 (two) times daily. Abdominal folds     omeprazole (PRILOSEC) 40 MG capsule Take 40 mg by mouth 2 (two) times daily.     potassium chloride (KLOR-CON) 10 MEQ tablet Take 2 tablets (20 mEq total) by mouth daily. 180 tablet 3   rosuvastatin (CRESTOR) 40 MG tablet Take 40 mg by mouth daily.     Sennosides 17.2 MG TABS Take 34.4 mg by mouth 3 (three) times a week. No set days     No facility-administered medications prior to visit.   Review of Systems  Review of Systems  Constitutional: Negative.   HENT: Negative.    Respiratory:  Positive for cough and shortness of breath. Negative for chest tightness and wheezing.   Cardiovascular:  Positive for leg swelling.   Physical Exam  BP 110/70 (BP Location: Right Arm, Cuff Size: Large)   Pulse 70   Temp 98.4 F (36.9 C) (Temporal)   Ht 5\' 6"  (1.676 m)   SpO2 99%   BMI 59.72 kg/m  Physical Exam Constitutional:      Appearance: Normal appearance.  HENT:     Head: Normocephalic and atraumatic.  Cardiovascular:     Rate and Rhythm: Normal rate and regular rhythm.     Comments: +3BLE edema Pulmonary:     Effort: Pulmonary effort is normal. No respiratory distress.     Breath sounds: Normal breath sounds. No stridor. No wheezing or rhonchi.     Comments:  Distant crackles left base, otherwise clear. On 2L 99% RA  Skin:    General: Skin is warm and dry.  Neurological:     General: No focal deficit present.     Mental Status: She is alert and oriented to person, place, and time. Mental status is at baseline.  Psychiatric:        Mood and Affect: Mood normal.        Behavior: Behavior normal.        Thought Content: Thought content normal.        Judgment: Judgment normal.      Lab Results:  CBC    Component Value Date/Time   WBC 9.0 07/21/2021 2026   RBC 3.86 (L) 07/21/2021 2026   HGB 10.2 (L) 07/21/2021 2026   HCT 33.2 (L) 07/21/2021 2026   PLT 207 07/21/2021 2026   MCV 86.0  07/21/2021 2026   MCH 26.4 07/21/2021 2026   MCHC 30.7 07/21/2021 2026   RDW 15.1 07/21/2021 2026   LYMPHSABS 2.3 04/09/2021 0233   MONOABS 0.7 04/09/2021 0233   EOSABS 0.3 04/09/2021 0233   BASOSABS 0.0 04/09/2021 0233    BMET    Component Value Date/Time   NA 138 10/31/2021 0955   NA 144 01/10/2021 1445   K 3.7 10/31/2021 0955   CL 96 10/31/2021 0955   CO2 33 (H) 10/31/2021 0955   GLUCOSE 300 (H) 10/31/2021 0955   BUN 27 (H) 10/31/2021 0955   BUN 19 01/10/2021 1445   CREATININE 1.30 (H) 10/31/2021 0955   CALCIUM 9.1 10/31/2021 0955   GFRNONAA >60 07/21/2021 2026   GFRAA >60 06/23/2014 0357    BNP    Component Value Date/Time   BNP 50.6 07/22/2021 0141    ProBNP    Component Value Date/Time   PROBNP 73 10/31/2021 0955    Imaging: No results found.   Assessment & Plan:   Acute on chronic congestive heart failure (HCC) - Patient has ongoing symptoms of dyspnea and leg swelling. Continue Furosemide 40mg  daily. Needs repeat BMET and BNP. Patient likely need diuretics readdressed by cardiology.  Daytime somnolence - She has been ordered for in-lab sleep study by Dr. 12/31/2021, we will work on getting this scheduled has high priority d/t nocturnal hypoxia and high suspicion for OSA/OHS    Shortness of breath - Felt most likely related to obesity, deconditioning and heart failure. No clear underlying lung disease at this point.  Spirometry in office today showed mild restriction, FEV1 2.47 (82%), ratio 93. Ordered for full pulmonary function testing.  We will get a routine chest x-ray today.  Chronic respiratory failure with hypoxia (HCC) - Overnight oximetry test on 11/07/2021 showed patient spent 2 hours and 52 minutes with SPO2 less than 88%.  Needs to continue to use 3 L of oxygen at bedtime and 2L during daytime.  Needs oxygen for survival.  She is awaiting in lab sleep study.  Suspect underlying OSA/OHS.  We have advised that she get a wedge pillow to elevate  head of bed 30 degrees while sleeping.   Glenford Bayley, NP 11/15/2021

## 2021-11-15 NOTE — Assessment & Plan Note (Addendum)
-   Patient has ongoing symptoms of dyspnea and leg swelling. Continue Furosemide 40mg  daily. Needs repeat BMET and BNP. Patient likely need diuretics readdressed by cardiology.

## 2021-11-15 NOTE — Assessment & Plan Note (Addendum)
-   She has been ordered for in-lab sleep study by Dr. Lake Bells, we will work on getting this scheduled has high priority d/t nocturnal hypoxia and high suspicion for OSA/OHS

## 2021-11-15 NOTE — Progress Notes (Signed)
Reviewed, agree 

## 2021-11-15 NOTE — Telephone Encounter (Signed)
[name] said she will start the authorization for this patient this morning.

## 2021-11-15 NOTE — Telephone Encounter (Signed)
This patient was ordered for in lab sleep study by Dr. Lake Bells on October 4, where we at with scheduling this?  Need this to be done as a Press [employer]. High suspicion patient has underlying obstructive sleep apnea or obesity hypoventilation syndrome.   Thank you for helping with this, let me know if you need anything

## 2021-11-16 DIAGNOSIS — E113413 Type 2 diabetes mellitus with severe nonproliferative diabetic retinopathy with macular edema, bilateral: Secondary | ICD-10-CM | POA: Diagnosis not present

## 2021-11-16 DIAGNOSIS — H2513 Age-related nuclear cataract, bilateral: Secondary | ICD-10-CM | POA: Diagnosis not present

## 2021-11-16 NOTE — Telephone Encounter (Signed)
Amazing.

## 2021-11-16 NOTE — Telephone Encounter (Signed)
Not a problem.  The auth can be transferred to the split night without any issues.

## 2021-11-16 NOTE — Telephone Encounter (Signed)
Would it be possible to change in-lab polysomnograph to split night or would that set things back a lot?

## 2021-11-16 NOTE — Telephone Encounter (Signed)
Terri from sleep lab just called me - she has opening come up for tonight and she is going to call pt.  She said if she can't come next opening is for 11/1.  Order you placed is for a split night.

## 2021-11-16 NOTE — Progress Notes (Signed)
BNP was normal. Have her continue lasix 40mg  daily and follow-up with cardiology to manage diuretics. Continue oxygen at bedtime and sleep with wedge pillow. We are working on [place] for sleep study

## 2021-11-16 NOTE — Telephone Encounter (Signed)
That is unfortunate. Ok

## 2021-11-16 NOTE — Telephone Encounter (Signed)
FYI-[name] messaged me again - pt states she can't go tonight or 11/1 so she is scheduled for 11/19.

## 2021-11-20 ENCOUNTER — Encounter: Payer: Self-pay | Admitting: Pulmonary Disease

## 2021-11-30 IMAGING — CR DG CHEST 2V
2 series · 2 of 2 positions shown · non-contrast
Comparison: 03/28/2020

CLINICAL DATA: Short of breath, chest heaviness, weight gain

EXAM:
CHEST - 2 VIEW

[chest lat]
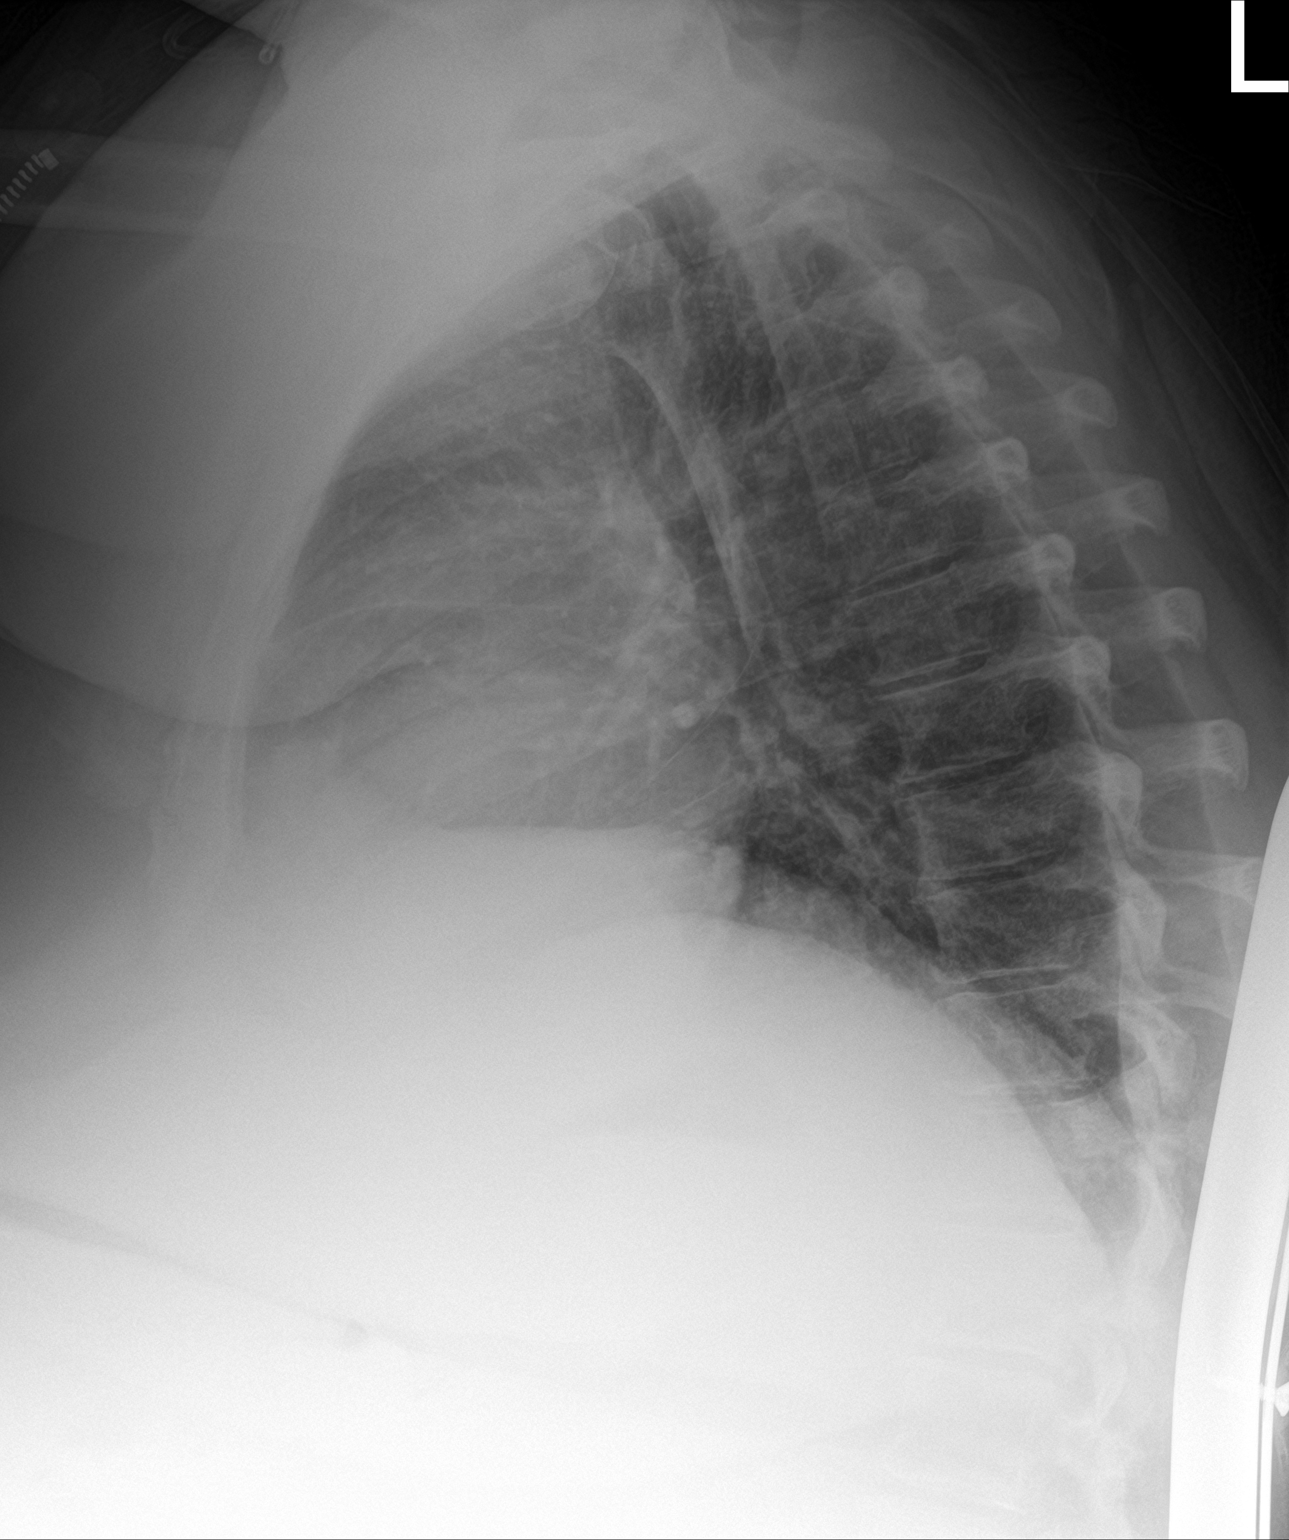

[chest ap]
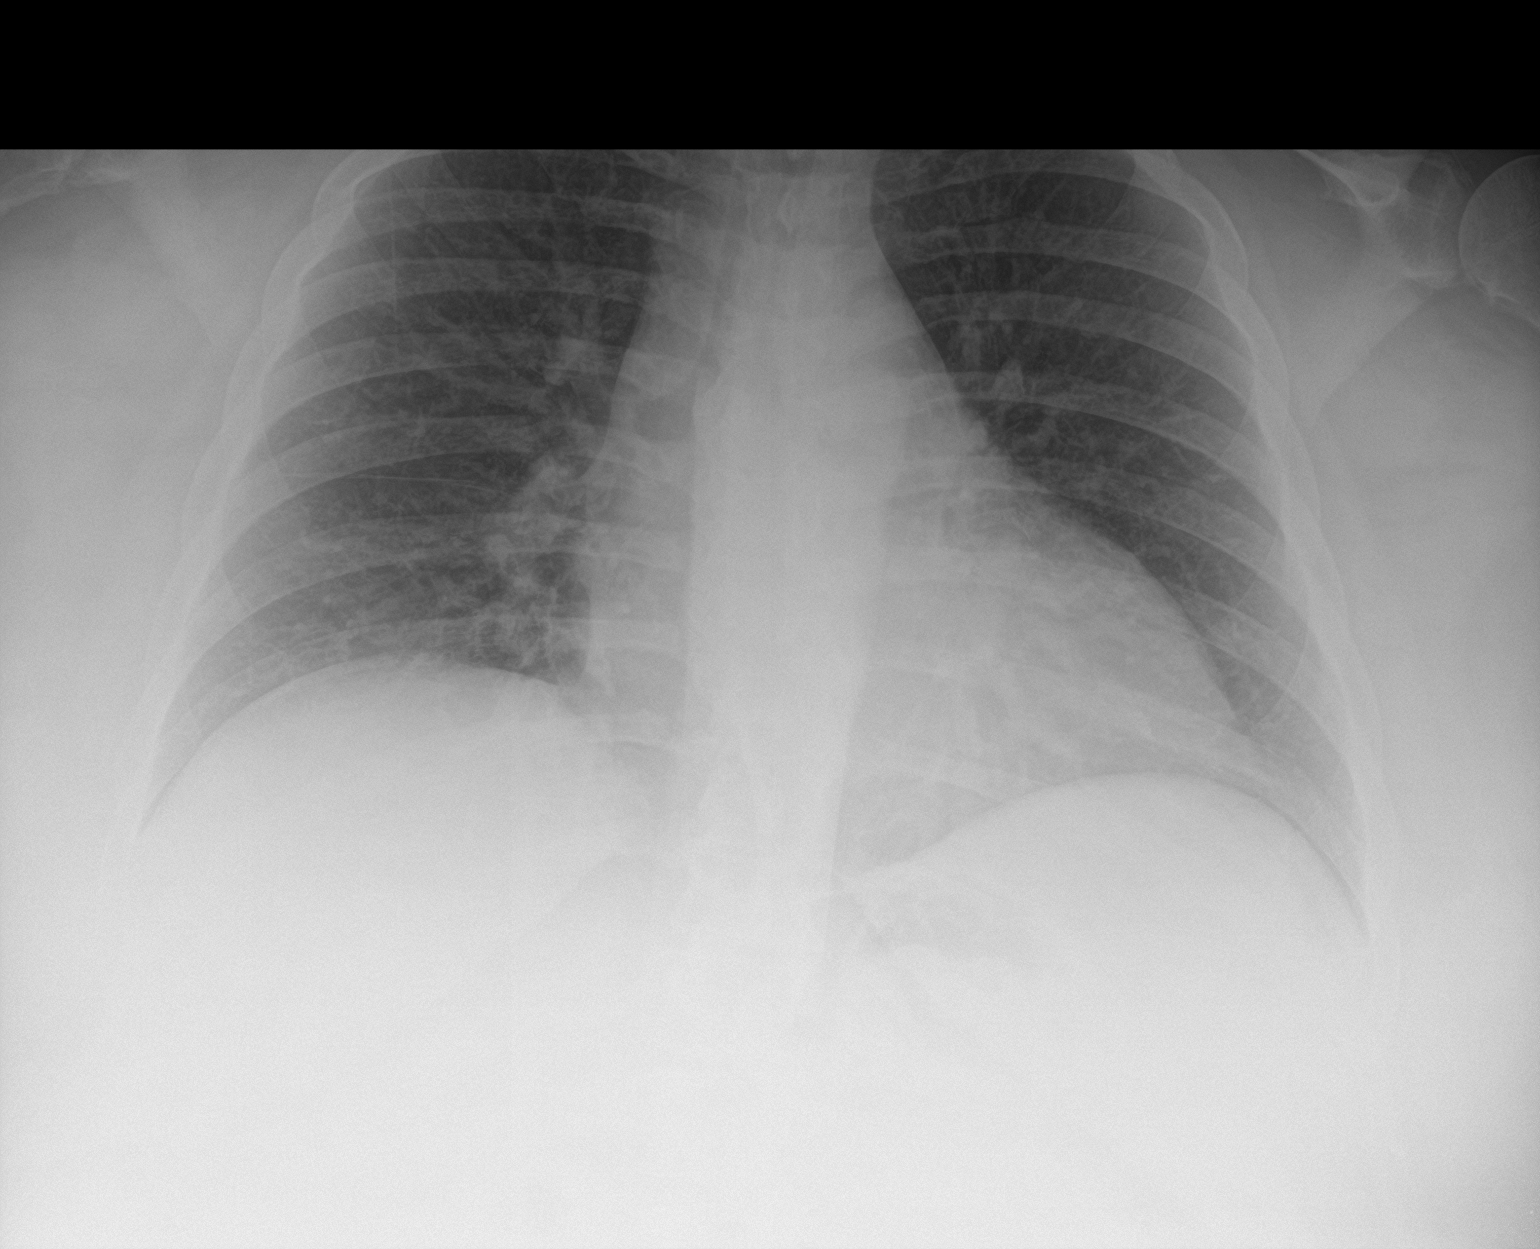

[2 of 2 positions shown; findings below may reference images not displayed]

FINDINGS: Frontal and lateral views of the chest demonstrate an unremarkable
cardiac silhouette. No airspace disease, effusion, or pneumothorax.
IMPRESSION: 1. No acute intrathoracic process.

## 2021-12-07 IMAGING — CT CT HEAD W/O CM
3 series · 15 of 47 positions shown, 18 images · non-contrast
Comparison: None.

CLINICAL DATA: Neuro deficit, acute, stroke suspected

Dizziness and lightheadedness for 2 weeks.
EXAM:
CT HEAD WITHOUT CONTRAST
TECHNIQUE: Contiguous axial images were obtained from the base of the skull
through the vertex without intravenous contrast.

[Series 3: head 5.0 h30s · axial · 0.45mm/px · z∈[-102,+33]mm · 9 of 33 slices shown, 12 images]
[im 3/33  brain]
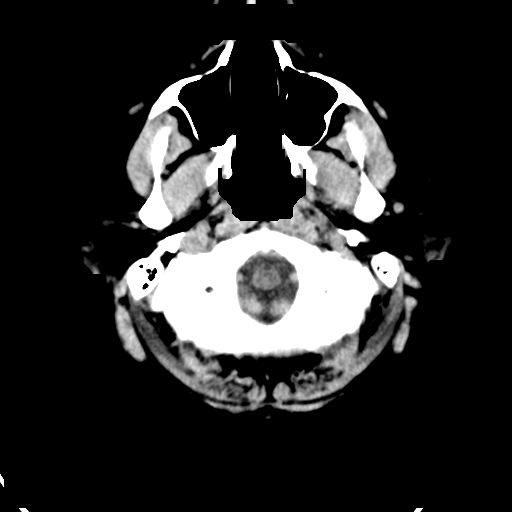
[im 3/33  bone]
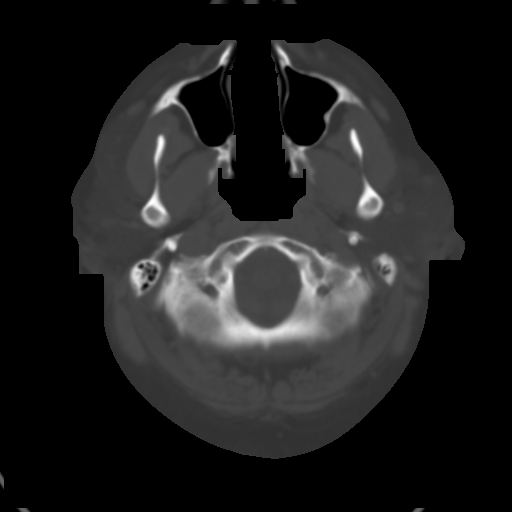
[im 6/33  brain]
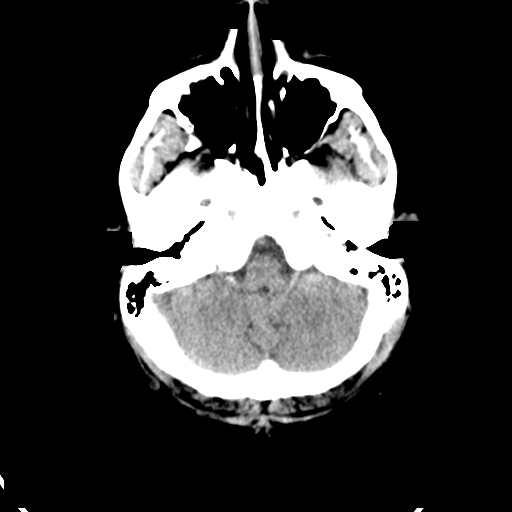
[im 9/33  brain]
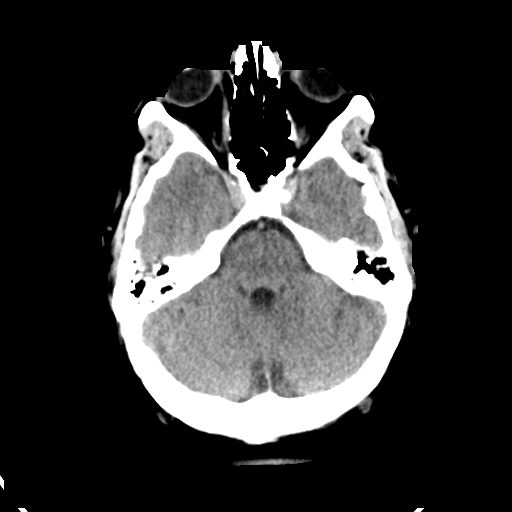
[im 13/33  brain]
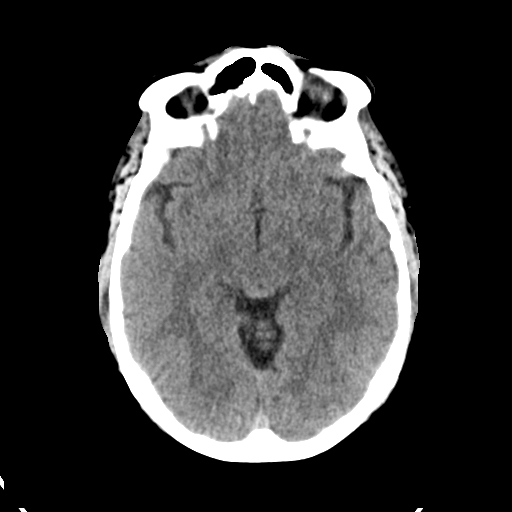
[im 17/33  brain]
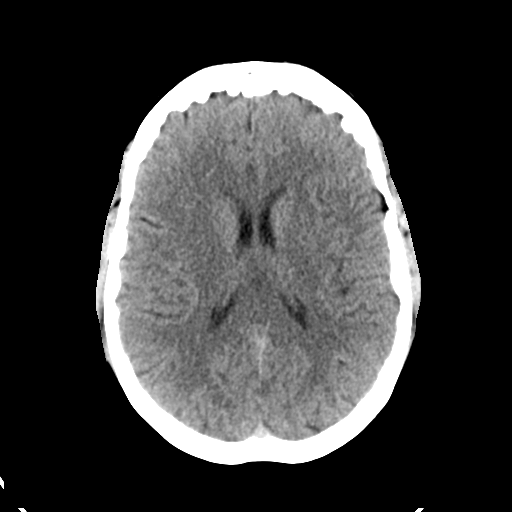
[im 17/33  bone]
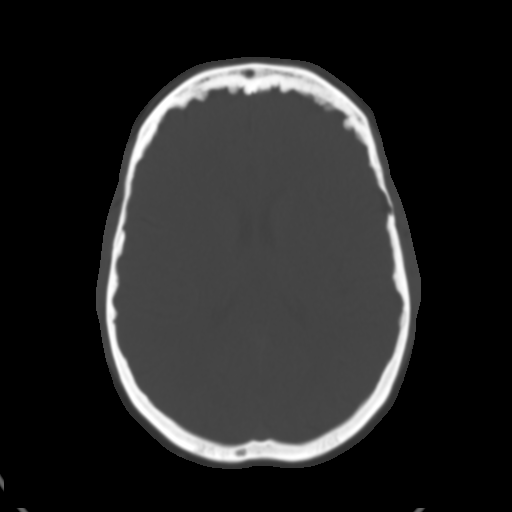
[im 20/33  brain]
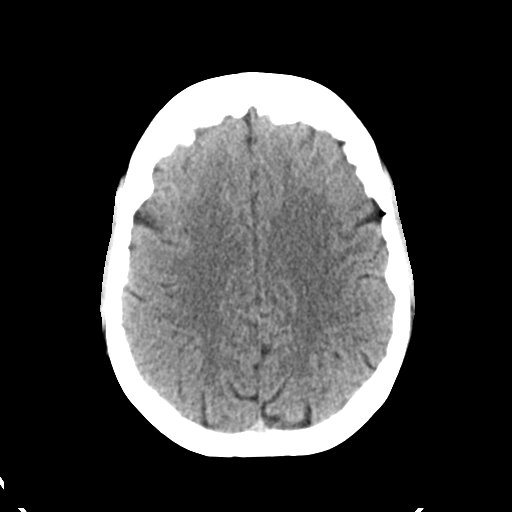
[im 24/33  brain]
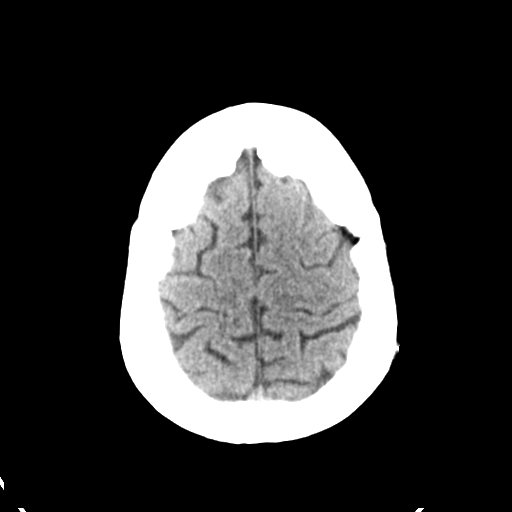
[im 27/33  brain]
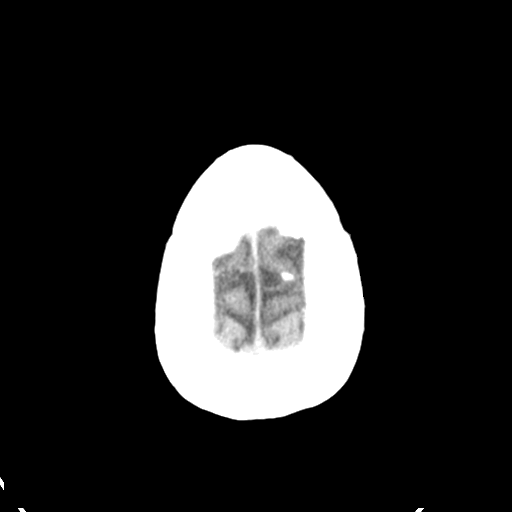
[im 30/33  brain]
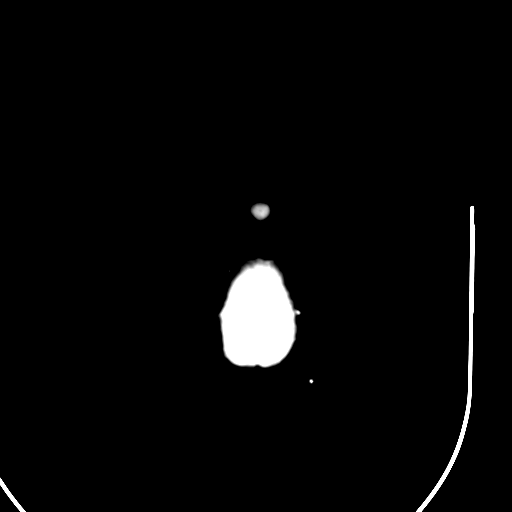
[im 30/33  bone]
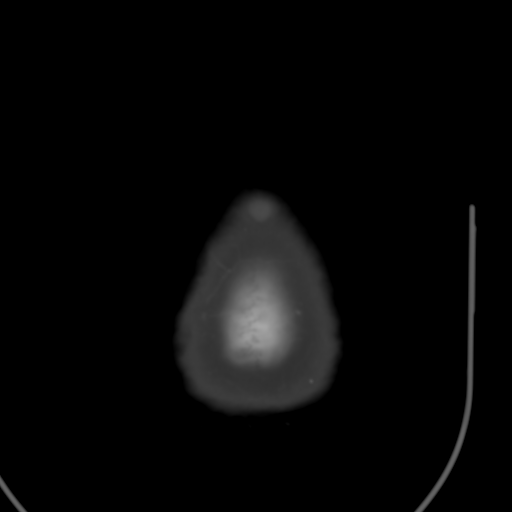

[Series 5: head 3.0 mpr cor · coronal · 0.29mm/px · 3 of 70 slices shown]
[im 24/70  brain]
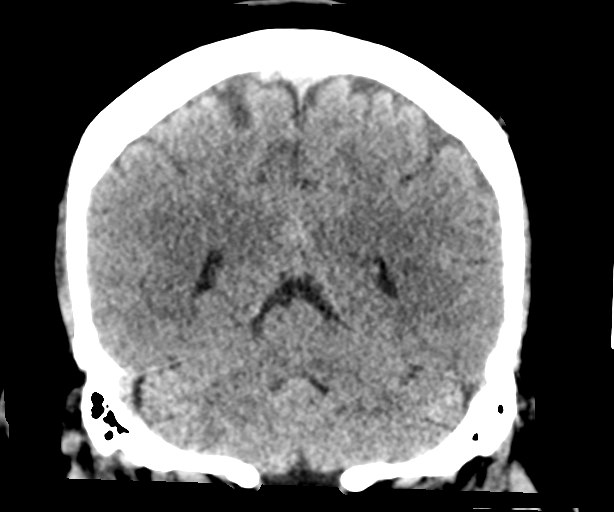
[im 31/70  brain]
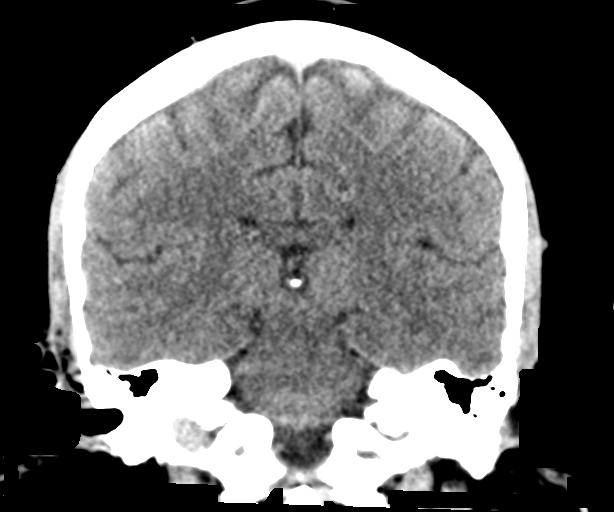
[im 39/70  brain]
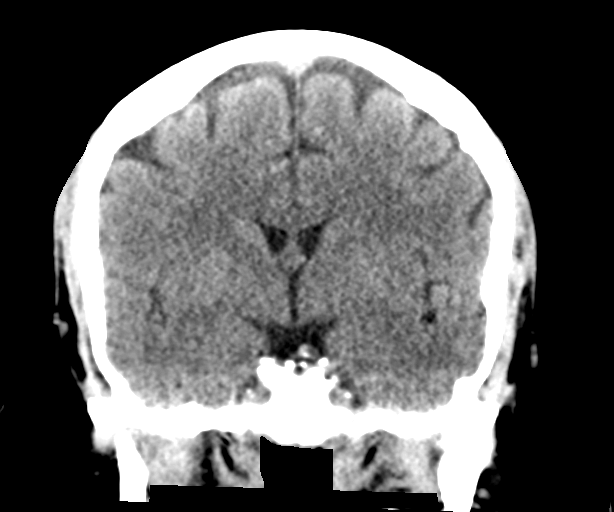

[Series 6: head 3.0 mpr sag · sagittal · 0.30mm/px · 3 of 61 slices shown]
[im 21/61  brain]
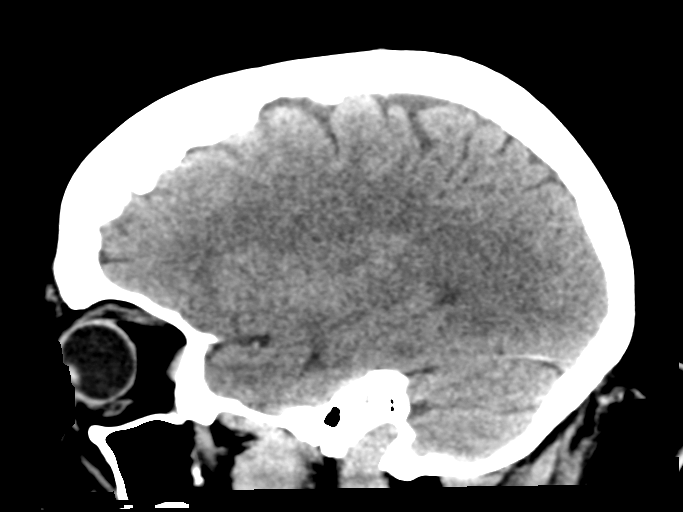
[im 31/61  brain]
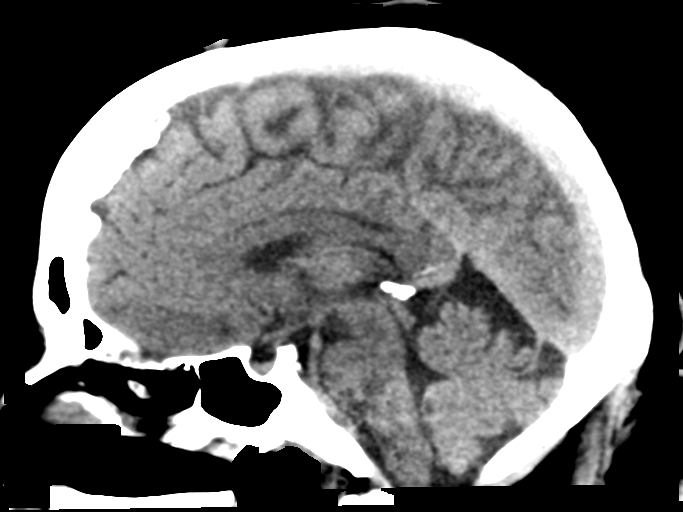
[im 41/61  brain]
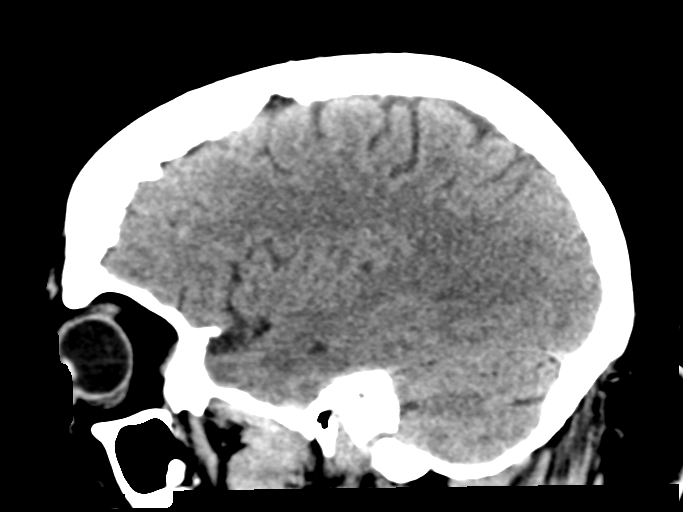

[15 of 47 positions shown; findings below may reference images not displayed]

FINDINGS: Brain: No intracranial hemorrhage, mass effect, or midline shift. No
hydrocephalus. The basilar cisterns are patent. No evidence of
territorial infarct or acute ischemia. No extra-axial or
intracranial fluid collection.

Vascular: No hyperdense vessel or unexpected calcification.

Skull: Normal. Negative for fracture or focal lesion.

Sinuses/Orbits: Paranasal sinuses and mastoid air cells are clear.
The visualized orbits are unremarkable.

Other: Small sebaceous cyst involving the vertex of the scalp.
IMPRESSION: 1.  No acute intracranial abnormality.
2. Small scalp sebaceous cyst at the vertex, incidental.

## 2021-12-11 DIAGNOSIS — E1169 Type 2 diabetes mellitus with other specified complication: Secondary | ICD-10-CM | POA: Diagnosis not present

## 2021-12-11 DIAGNOSIS — E039 Hypothyroidism, unspecified: Secondary | ICD-10-CM | POA: Diagnosis not present

## 2021-12-11 DIAGNOSIS — Z794 Long term (current) use of insulin: Secondary | ICD-10-CM | POA: Diagnosis not present

## 2021-12-11 DIAGNOSIS — Z6841 Body Mass Index (BMI) 40.0 and over, adult: Secondary | ICD-10-CM | POA: Diagnosis not present

## 2021-12-11 DIAGNOSIS — I10 Essential (primary) hypertension: Secondary | ICD-10-CM | POA: Diagnosis not present

## 2021-12-11 DIAGNOSIS — I5032 Chronic diastolic (congestive) heart failure: Secondary | ICD-10-CM | POA: Diagnosis not present

## 2021-12-16 ENCOUNTER — Ambulatory Visit (HOSPITAL_BASED_OUTPATIENT_CLINIC_OR_DEPARTMENT_OTHER): Payer: BC Managed Care – PPO | Attending: Pulmonary Disease | Admitting: Pulmonary Disease

## 2022-01-15 ENCOUNTER — Other Ambulatory Visit: Payer: Self-pay

## 2022-01-15 MED ORDER — METFORMIN HCL ER 500 MG PO TB24: 2000.0000 mg | ORAL_TABLET | Freq: Every day | ORAL | 0 refills | Status: DC

## 2022-01-25 ENCOUNTER — Ambulatory Visit (HOSPITAL_BASED_OUTPATIENT_CLINIC_OR_DEPARTMENT_OTHER): Payer: BC Managed Care – PPO | Admitting: Pulmonary Disease

## 2022-02-01 ENCOUNTER — Ambulatory Visit: Payer: BC Managed Care – PPO | Admitting: Pulmonary Disease

## 2022-02-13 ENCOUNTER — Encounter: Payer: Self-pay | Admitting: Endocrinology

## 2022-02-13 ENCOUNTER — Ambulatory Visit (INDEPENDENT_AMBULATORY_CARE_PROVIDER_SITE_OTHER): Payer: Medicaid Other | Admitting: Endocrinology

## 2022-02-13 VITALS — BP 122/80 | HR 71 | Ht 66.0 in | Wt 377.4 lb

## 2022-02-13 DIAGNOSIS — E1165 Type 2 diabetes mellitus with hyperglycemia: Secondary | ICD-10-CM | POA: Diagnosis not present

## 2022-02-13 DIAGNOSIS — E039 Hypothyroidism, unspecified: Secondary | ICD-10-CM

## 2022-02-13 DIAGNOSIS — Z794 Long term (current) use of insulin: Secondary | ICD-10-CM

## 2022-02-13 LAB — COMPREHENSIVE METABOLIC PANEL
ALT: 27 U/L (ref 0–35)
AST: 26 U/L (ref 0–37)
Albumin: 3.9 g/dL (ref 3.5–5.2)
Alkaline Phosphatase: 67 U/L (ref 39–117)
BUN: 28 mg/dL — ABNORMAL HIGH (ref 6–23)
CO2: 36 mEq/L — ABNORMAL HIGH (ref 19–32)
Calcium: 9 mg/dL (ref 8.4–10.5)
Chloride: 98 mEq/L (ref 96–112)
Creatinine, Ser: 1.04 mg/dL (ref 0.40–1.20)
GFR: 62.59 mL/min (ref >60.00)
Glucose, Bld: 82 mg/dL (ref 70–99)
Potassium: 3.5 mEq/L (ref 3.5–5.1)
Sodium: 141 mEq/L (ref 135–145)
Total Bilirubin: 0.8 mg/dL (ref 0.2–1.2)
Total Protein: 7.1 g/dL (ref 6.0–8.3)

## 2022-02-13 LAB — POCT GLYCOSYLATED HEMOGLOBIN (HGB A1C): Hemoglobin A1C: 9.5 % — AB (ref 4.0–5.6)

## 2022-02-13 LAB — TSH: TSH: 2.62 u[IU]/mL (ref 0.35–5.50)

## 2022-02-13 MED ORDER — EMPAGLIFLOZIN 25 MG PO TABS: 25.0000 mg | ORAL_TABLET | Freq: Every day | ORAL | 2 refills | Status: DC

## 2022-02-13 MED ORDER — FREESTYLE LIBRE 3 SENSOR MISC: 1.0000 | 2 refills | Status: DC

## 2022-02-13 MED ORDER — NOVOLIN 70/30 RELION (70-30) 100 UNIT/ML ~~LOC~~ SUSP: SUBCUTANEOUS | 3 refills | Status: DC

## 2022-02-13 MED ORDER — FREESTYLE LIBRE 3 READER DEVI: 1.0000 | Freq: Once | 0 refills | Status: AC

## 2022-02-13 NOTE — Patient Instructions (Signed)
Start OZEMPIC injections by dialing 0.25 mg on the pen as shown once weekly on the same day of the week.   You may inject in the sides of the stomach, outer thigh or arm as indicated in the brochure given. If you have any difficulties using the pen see the video at HowtoUseOzempic.com  You will feel fullness of the stomach with starting the medication and should try to keep the portions at meals small.  You may experience nausea in the first few days which usually gets better over time    After 4 weeks increase the dose to 0.5 mg weekly  If you have any questions or persistent side effects please call the office   You may also talk to a nurse educator with [employer] at [phone] Useful website: [email or web address]    

## 2022-02-13 NOTE — Progress Notes (Signed)
Subjective:    Patient ID: [name], female    DOB: [birth year], 52 y.o.   MRN: [id number]  HPI    Diagnosis date: age 52,  7  Previous history:  Initially was treated with Glucophage alone and not clear if she took other medications also Presumably about 51 years ago she was started on insulin likely because of poor control A1c has been as high as 12% in the past  Recent history:     Non-insulin hypoglycemic drugs: Metformin ER 500 mg, 2 g daily     Insulin regimen: Novolin 70/30, 150 units a.m.--125 units evening    Side effects from medications:   Most recent A1c was 7.2 as of 5/23  Current self management, blood sugar patterns and problems identified:  She has not been seen since July Overall again her diet has been poor and she has not an adequate control She states that previously when she was able to get the freestyle libre sensor she was watching her diet better and blood sugars are improved However she has increased her insulin significantly since her last visit and previously was taking no more than 200 units a day  Again not able to get [place] because of high cost  Has not tried a GLP-1 drug in the past She is still using generic monitor Blood sugar monitoring has been sporadic and may be having higher readings later in the day FASTING glucose recently 143-298 and nonfasting 221 No hypoglycemia despite taking large insulin doses As before she is not able to do much physical activity        Monitors blood glucose: 0-3x a day.    Glucometer: [employer].           Blood Glucose readings from review of monitor: Average 205 for the last month Previously as follows   PRE-MEAL Fasting Lunch Dinner Bedtime Overall  Glucose range: 211, 210 223-265 118, 192    Mean/median:     210   POST-MEAL PC Breakfast PC Lunch PC Dinner  Glucose range:   ?  Mean/median:       Dietician visit: Years ago  Weight control:  Wt Readings from Last 3  Encounters:  02/13/22 (!) 377 lb 6.4 oz (171.2 kg)  10/31/21 (!) 370 lb (167.8 kg)  08/20/21 (!) 368 lb (166.9 kg)       Complications:    CAD, Charcot foot, DR, and PN  Lab Results  Component Value Date   HGBA1C 9.5 (A) 02/13/2022   HGBA1C 7.2 (A) 05/31/2021   HGBA1C 9.2 (A) 02/27/2021   Lab Results  Component Value Date   MICROALBUR 12.2 (H) 08/21/2021   LDLCALC 48 05/31/2021   CREATININE 1.04 02/13/2022      Past Medical History:  Diagnosis Date   CAD (coronary artery disease)    a. s/p NSTEMI, LHC 03/2020 dRCA 25%, m-LAD-1 95%-0% (DES), mLAD-2 50%, LVEDP moderately elevated, 27 mmHg.   Chronic diastolic heart failure (HCC)    a. LHC/echo 3/22 c/ elevated LVEDP, EF 50-55%, hypokinesis; b. Echo 10/22 EF 60-65%, mild LVH, borderline dilatation of ascending aorta, 86mm.   DDD (degenerative disc disease), lumbar    Diabetes mellitus without complication (HCC)    GERD (gastroesophageal reflux disease)    Hyperlipidemia    Hypertension    Hypothyroidism    Lumbar herniated disc    Major depressive disorder    Morbid obesity [facility])     Past Surgical History:  Procedure Laterality Date  CORONARY STENT INTERVENTION N/A 03/28/2020   Procedure: CORONARY STENT INTERVENTION;  Surgeon: Jettie Booze, MD;  Location: [place] [facility];  Service: Cardiovascular;  Laterality: N/A;   DILATION AND CURETTAGE OF UTERUS     INCISION AND DRAINAGE PERIRECTAL ABSCESS  06/21/2014   INCISION AND DRAINAGE PERIRECTAL ABSCESS Bilateral 06/21/2014   Procedure: IRRIGATION AND DEBRIDEMENT PERIRECTAL ABSCESS;  Surgeon: Coralie Keens, MD;  Location: [place];  Service: General;  Laterality: Bilateral;   LEFT HEART CATH AND CORONARY ANGIOGRAPHY N/A 03/28/2020   Procedure: LEFT HEART CATH AND CORONARY ANGIOGRAPHY;  Surgeon: Jettie Booze, MD;  Location: [place] [facility];  Service: Cardiovascular;  Laterality: N/A;    Social History   Socioeconomic History   Marital status: Single     Spouse name: Not on file   Number of children: Not on file   Years of education: Not on file   Highest education level: Not on file  Occupational History   Not on file  Tobacco Use   Smoking status: Never   Smokeless tobacco: Never  Vaping Use   Vaping Use: Never used  Substance and Sexual Activity   Alcohol use: No   Drug use: No   Sexual activity: Not on file  Other Topics Concern   Not on file  Social History Narrative   Not on file   Social Determinants of Health   Financial Resource Strain: Medium Risk (03/29/2020)   Overall Financial Resource Strain (CARDIA)    Difficulty of Paying Living Expenses: Somewhat hard  Food Insecurity: No Food Insecurity (03/29/2020)   Hunger Vital Sign    Worried About Running Out of Food in the Last Year: Never true    [place] in the Last Year: Never true  Transportation Needs: No Transportation Needs (03/29/2020)   PRAPARE - Hydrologist (Medical): No    Lack of Transportation (Non-Medical): No  Physical Activity: Inactive (03/29/2020)   Exercise Vital Sign    Days of Exercise per Week: 0 days    Minutes of Exercise per Session: 0 min  Stress: Not on file  Social Connections: Not on file  Intimate Partner Violence: Not on file    Current Outpatient Medications on File Prior to Visit  Medication Sig Dispense Refill   acetaminophen (TYLENOL) 500 MG tablet Take 1,000 mg by mouth every 6 (six) hours as needed for moderate pain or headache.     aspirin EC 81 MG tablet Take 81 mg by mouth daily. Swallow whole.     B Complex-C (SUPER B COMPLEX PO) Take 1 tablet by mouth daily.     furosemide (LASIX) 40 MG tablet Take 1 tablet (40 mg total) by mouth daily.     gabapentin (NEURONTIN) 300 MG capsule Take 2 capsules by mouth 3 (three) times daily.     levothyroxine (SYNTHROID, LEVOTHROID) 50 MCG tablet Take 50 mcg by mouth daily.  3   metFORMIN (GLUCOPHAGE-XR) 500 MG 24 hr tablet Take 4 tablets (2,000 mg total) by  mouth daily. 360 tablet 0   metoprolol succinate (TOPROL-XL) 50 MG 24 hr tablet TAKE 1 TABLET BY MOUTH ONCE DAILY (TAKE  WITH  OR  IMMEDIATELY  FOLLOWING  A  MEAL) 90 tablet 3   nystatin cream (MYCOSTATIN) Apply 1 application  topically 2 (two) times daily. Abdominal folds     omeprazole (PRILOSEC) 40 MG capsule Take 40 mg by mouth 2 (two) times daily.     potassium  chloride (KLOR-CON) 10 MEQ tablet Take 2 tablets (20 mEq total) by mouth daily. 180 tablet 3   rosuvastatin (CRESTOR) 40 MG tablet Take 40 mg by mouth daily.     Cholecalciferol (VITAMIN D3) 50 MCG (2000 UT) capsule Take 2,000 Units by mouth daily. (Patient not taking: Reported on 02/13/2022)     Sennosides 17.2 MG TABS Take 34.4 mg by mouth 3 (three) times a week. No set days (Patient not taking: Reported on 02/13/2022)     No current facility-administered medications on file prior to visit.    Allergies  Allergen Reactions   Irbesartan Other (See Comments)    Dizziness, muscle pain   Clindamycin/Lincomycin Other (See Comments) and Nausea And Vomiting    Stomach pain    Latex Rash   Ultram [Tramadol] Rash    Family History  Problem Relation Age of Onset   Diabetes Mother    Diabetes Father    Heart disease Neg Hx     BP 122/80   Pulse 71   Ht 5\' 6"  (1.676 m)   Wt (!) 377 lb 6.4 oz (171.2 kg)   SpO2 98%   BMI 60.91 kg/m   Review of Systems  NEUROPATHY: She takes gabapentin long-term for neuropathic pains in the legs  Hypothyroidism: She has had mild hypothyroidism for several years on 50ug levothyroxine No fatigue symptoms, her TSH in the last year has been high only once around 5 Her PCP wants her thyroid to be monitored here now  Lab Results  Component Value Date   TSH 2.62 02/13/2022   TSH 2.98 05/31/2021   TSH 2.04 01/01/2021   FREET4 0.90 01/01/2021   FREET4 1.02 03/28/2020   LIPIDS: She has been prescribed 40 mg Crestor by her PCP  Lab Results  Component Value Date   CHOL 121 05/31/2021   HDL  38.60 (L) 05/31/2021   LDLCALC 48 05/31/2021   TRIG 175.0 (H) 05/31/2021   CHOLHDL 3 05/31/2021   She is not on any treatment for high blood pressure  BP Readings from Last 3 Encounters:  02/13/22 122/80  11/15/21 110/70  10/31/21 122/82     She tends to have chronic nausea and reflux and is taking Prilosec and Phenergan     Objective:   Physical Exam    BP 122/80   Pulse 71   Ht 5\' 6"  (1.676 m)   Wt (!) 377 lb 6.4 oz (171.2 kg)   SpO2 98%   BMI 60.91 kg/m   2+ lower leg edema present  Assessment & Plan:   DIABETES with obesity  See HPI for details of her current management, blood sugar patterns and problems identified  A1c is 9.5 and blood sugars are appearing significantly higher at home now despite using large doses of insulin Glucose monitoring also inadequate and unable to identify glucose patterns  PLAN:  She will try to get restarted on the freestyle libre sensor using the libre 3 She will benefit from both using an SGLT2 drug under a GLP-1 drug  Discussed action of SGLT 2 drugs on lowering glucose by decreasing kidney absorption of glucose, benefits of weight loss and lower blood pressure, possible side effects including candidiasis and dosage regimen  She will go to 25 mg of Jardiance Needs to have blood pressure monitored periodically at home and with her cardiologist and also Lasix dose adjusted if needed Baseline renal function to be checked today Explained to the patient what GLP-1 drugs are, the sites of actions and  the body, reduction of hunger sensation and improved insulin secretion.  Discussed the benefit of weight loss with these medications. Explained possible side effects of OZEMPIC, most commonly nausea that usually improves over time.  Also explained safety information associated with the medication Demonstrated the medication injection device and injection technique to the patient.  To start the injections with the 0.25 mg dosage weekly for the  first 4 injections and then go up to 0.5 mg weekly if no continued nausea Patient brochure on Ozempic and dosage regimen given Given tips on how to reduce side effects like nausea and she can still take Phenergan as needed If her blood sugars are trending below 100 before dinner she will reduce her morning insulin 10 units and if the morning sugars are below 100 reduce the evening dose also 10 units Try to be consistent with diet  Check thyroid levels for hypothyroidism  Patient Instructions  Start OZEMPIC injections by dialing 0.25 mg on the pen as shown once weekly on the same day of the week.   You may inject in the sides of the stomach, outer thigh or arm as indicated in the brochure given. If you have any difficulties using the pen see the video at CompPlans.co.za  You will feel fullness of the stomach with starting the medication and should try to keep the portions at meals small.  You may experience nausea in the first few days which usually gets better over time    After 4 weeks increase the dose to 0.5 mg weekly  If you have any questions or persistent side effects please call the office   You may also talk to a nurse educator with [employer] at [phone] Useful website: [email or web address]     Total visit time for evaluation and management of multiple problems and counseling = 40 minutes    Niza Soderholm Dwyane Dee

## 2022-02-13 NOTE — Progress Notes (Deleted)
HPI: Follow-up coronary artery disease, congestive heart failure and hypertension.  Had non-ST elevation myocardial infarction March 2022.  Cardiac catheterization March 2022 showed 95% mid LAD which was treated with drug-eluting stent and left ventricular end-diastolic pressure of 27 mmHg.  Echocardiogram October 2022 showed normal LV function, mild left ventricular enlargement, mild left ventricular hypertrophy. Since last seen,   Current Outpatient Medications  Medication Sig Dispense Refill   acetaminophen (TYLENOL) 500 MG tablet Take 1,000 mg by mouth every 6 (six) hours as needed for moderate pain or headache.     aspirin EC 81 MG tablet Take 81 mg by mouth daily. Swallow whole.     B Complex-C (SUPER B COMPLEX PO) Take 1 tablet by mouth daily.     Cholecalciferol (VITAMIN D3) 50 MCG (2000 UT) capsule Take 2,000 Units by mouth daily. (Patient not taking: Reported on 02/13/2022)     Continuous Blood Gluc Receiver (FREESTYLE LIBRE 3 READER) DEVI 1 Device by Does not apply route once for 1 dose. 1 each 0   Continuous Blood Gluc Sensor (FREESTYLE LIBRE 3 SENSOR) MISC 1 Device by Does not apply route every 14 (fourteen) days. Apply 1 sensor on upper arm every 14 days for continuous glucose monitoring 2 each 2   empagliflozin (JARDIANCE) 25 MG TABS tablet Take 1 tablet (25 mg total) by mouth daily before breakfast. 30 tablet 2   furosemide (LASIX) 40 MG tablet Take 1 tablet (40 mg total) by mouth daily.     gabapentin (NEURONTIN) 300 MG capsule Take 2 capsules by mouth 3 (three) times daily.     insulin NPH-regular Human (NOVOLIN 70/30 RELION) (70-30) 100 UNIT/ML injection 150 units with breakfast, and 125 units with supper. 80 mL 3   levothyroxine (SYNTHROID, LEVOTHROID) 50 MCG tablet Take 50 mcg by mouth daily.  3   metFORMIN (GLUCOPHAGE-XR) 500 MG 24 hr tablet Take 4 tablets (2,000 mg total) by mouth daily. 360 tablet 0   metoprolol succinate (TOPROL-XL) 50 MG 24 hr tablet TAKE 1 TABLET BY  MOUTH ONCE DAILY (TAKE  WITH  OR  IMMEDIATELY  FOLLOWING  A  MEAL) 90 tablet 3   nystatin cream (MYCOSTATIN) Apply 1 application  topically 2 (two) times daily. Abdominal folds     omeprazole (PRILOSEC) 40 MG capsule Take 40 mg by mouth 2 (two) times daily.     potassium chloride (KLOR-CON) 10 MEQ tablet Take 2 tablets (20 mEq total) by mouth daily. 180 tablet 3   rosuvastatin (CRESTOR) 40 MG tablet Take 40 mg by mouth daily.     Sennosides 17.2 MG TABS Take 34.4 mg by mouth 3 (three) times a week. No set days (Patient not taking: Reported on 02/13/2022)     No current facility-administered medications for this visit.     Past Medical History:  Diagnosis Date   CAD (coronary artery disease)    a. s/p NSTEMI, LHC 03/2020 dRCA 25%, m-LAD-1 95%-0% (DES), mLAD-2 50%, LVEDP moderately elevated, 27 mmHg.   Chronic diastolic heart failure ([place])    a. LHC/echo 3/22 c/ elevated LVEDP, EF 50-55%, hypokinesis; b. Echo 10/22 EF 60-65%, mild LVH, borderline dilatation of ascending aorta, 76m.   DDD (degenerative disc disease), lumbar    Diabetes mellitus without complication ([place]    GERD (gastroesophageal reflux disease)    Hyperlipidemia    Hypertension    Hypothyroidism    Lumbar herniated disc    Major depressive disorder    Morbid obesity ([facility]  Past Surgical History:  Procedure Laterality Date   CORONARY STENT INTERVENTION N/A 03/28/2020   Procedure: CORONARY STENT INTERVENTION;  Surgeon: Jettie Booze, MD;  Location: [facility];  Service: Cardiovascular;  Laterality: N/A;   DILATION AND CURETTAGE OF UTERUS     INCISION AND DRAINAGE PERIRECTAL ABSCESS  06/21/2014   INCISION AND DRAINAGE PERIRECTAL ABSCESS Bilateral 06/21/2014   Procedure: IRRIGATION AND DEBRIDEMENT PERIRECTAL ABSCESS;  Surgeon: Coralie Keens, MD;  Location: [facility];  Service: General;  Laterality: Bilateral;   LEFT HEART CATH AND CORONARY ANGIOGRAPHY N/A 03/28/2020   Procedure: LEFT HEART CATH AND CORONARY  ANGIOGRAPHY;  Surgeon: Jettie Booze, MD;  Location: [place] [facility];  Service: Cardiovascular;  Laterality: N/A;    Social History   Socioeconomic History   Marital status: Single    Spouse name: Not on file   Number of children: Not on file   Years of education: Not on file   Highest education level: Not on file  Occupational History   Not on file  Tobacco Use   Smoking status: Never   Smokeless tobacco: Never  Vaping Use   Vaping Use: Never used  Substance and Sexual Activity   Alcohol use: No   Drug use: No   Sexual activity: Not on file  Other Topics Concern   Not on file  Social History Narrative   Not on file   Social Determinants of Health   Financial Resource Strain: Medium Risk (03/29/2020)   Overall Financial Resource Strain (CARDIA)    Difficulty of Paying Living Expenses: Somewhat hard  Food Insecurity: No Food Insecurity (03/29/2020)   Hunger Vital Sign    Worried About Running Out of Food in the Last Year: Never true    [place] in the Last Year: Never true  Transportation Needs: No Transportation Needs (03/29/2020)   PRAPARE - Hydrologist (Medical): No    Lack of Transportation (Non-Medical): No  Physical Activity: Inactive (03/29/2020)   Exercise Vital Sign    Days of Exercise per Week: 0 days    Minutes of Exercise per Session: 0 min  Stress: Not on file  Social Connections: Not on file  Intimate Partner Violence: Not on file    Family History  Problem Relation Age of Onset   Diabetes Mother    Diabetes Father    Heart disease Neg Hx     ROS: no fevers or chills, productive cough, hemoptysis, dysphasia, odynophagia, melena, hematochezia, dysuria, hematuria, rash, seizure activity, orthopnea, PND, pedal edema, claudication. Remaining systems are negative.  Physical Exam: Well-developed well-nourished in no acute distress.  Skin is warm and dry.  HEENT is normal.  Neck is supple.  Chest is clear to  auscultation with normal expansion.  Cardiovascular exam is regular rate and rhythm.  Abdominal exam nontender or distended. No masses palpated. Extremities show no edema. neuro grossly intact  ECG- personally reviewed  A/P  1 coronary artery disease-pt denies CP; plan to continue medical therapy including aspirin and statin.  Note LV function is normal.   2 chronic diastolic congestive heart failure-continue diuretic at present dose; continue Jardiance.   3 chronic dyspnea/respiratory failure-patient is on home oxygen.     4 hypertension-patient's blood pressure is controlled; continue present meds and follow.   5 hyperlipidemia-continue crestor.   6 morbid obesity-needs weight loss.  Kirk Ruths, MD

## 2022-02-14 ENCOUNTER — Other Ambulatory Visit: Payer: Self-pay

## 2022-02-14 DIAGNOSIS — E1165 Type 2 diabetes mellitus with hyperglycemia: Secondary | ICD-10-CM

## 2022-02-14 MED ORDER — INSULIN SYRINGES (DISPOSABLE) U-100 1 ML MISC: 3 refills | Status: AC

## 2022-02-19 ENCOUNTER — Telehealth: Payer: Self-pay

## 2022-02-19 DIAGNOSIS — E1165 Type 2 diabetes mellitus with hyperglycemia: Secondary | ICD-10-CM

## 2022-02-19 NOTE — Telephone Encounter (Signed)
Patient called in needs a prior [place] for [place] 3, jardiance, and Novolin 70/30. Patient doesn't have any so if we could start as soon as possible.

## 2022-02-21 ENCOUNTER — Telehealth: Payer: Self-pay | Admitting: Pharmacy Technician

## 2022-02-21 ENCOUNTER — Other Ambulatory Visit (HOSPITAL_COMMUNITY): Payer: Self-pay

## 2022-02-21 ENCOUNTER — Telehealth: Payer: Self-pay | Admitting: Pulmonary Disease

## 2022-02-21 MED ORDER — HUMULIN 70/30 (70-30) 100 UNIT/ML ~~LOC~~ SUSP: SUBCUTANEOUS | 2 refills | Status: DC

## 2022-02-21 NOTE — Telephone Encounter (Signed)
Humulin sent to pharmacy

## 2022-02-21 NOTE — Addendum Note (Signed)
Addended by: Cinda Quest on: 02/21/2022 04:36 PM   Modules accepted: Orders

## 2022-02-21 NOTE — Telephone Encounter (Signed)
Pharmacy Patient Advocate Encounter  Prior Authorization for Jardiance 25mg  has been approved.    Effective dates: 02/21/22 through 02/22/23  Spoke with Pharmacy to process. It's still not going through - will f/u on that.   Prior authorization for Libre 3 has been denied due to not having an insulin pump or requiring to often change how much insulin used based on blood sugar test. (It never asked this)   Please be advised we currently do not have a Pharmacist to review denials, therefore you will need to process appeals accordingly as needed. Thanks for your support at this time.  Denial letter saved in chart. Phone # for [employer] [facility] in [facility] is [phone], fax # [phone]  Please send RX for Humulin 70/30 rather than Novolin.

## 2022-02-21 NOTE — Telephone Encounter (Signed)
PT has changed insurance wanted PCC's to know. HST tomorrow. I changed it in the system. TY

## 2022-02-21 NOTE — Telephone Encounter (Signed)
Pharmacy Patient Advocate Encounter   Received notification from CMA/Pt call msg that prior authorization for Freestyle Libre 3 sensors, Jardiance and Novolin 70/30 are required/requested.  Messaged Dr. Dwyane Dee to see if pt can take Humulin rather than Novolin, as it's preferred?   PA submitted on 02/21/22 to (ins) [employer] via [place] 3 sensors, [id number] Jardiance Status is pending

## 2022-02-22 ENCOUNTER — Telehealth: Payer: Self-pay

## 2022-02-22 ENCOUNTER — Ambulatory Visit (HOSPITAL_BASED_OUTPATIENT_CLINIC_OR_DEPARTMENT_OTHER): Payer: Medicaid Other | Attending: Pulmonary Disease | Admitting: Pulmonary Disease

## 2022-02-22 VITALS — Ht 67.0 in | Wt 377.0 lb

## 2022-02-22 DIAGNOSIS — E119 Type 2 diabetes mellitus without complications: Secondary | ICD-10-CM | POA: Insufficient documentation

## 2022-02-22 DIAGNOSIS — G4736 Sleep related hypoventilation in conditions classified elsewhere: Secondary | ICD-10-CM | POA: Insufficient documentation

## 2022-02-22 DIAGNOSIS — R400 Somnolence: Secondary | ICD-10-CM | POA: Diagnosis present

## 2022-02-22 DIAGNOSIS — R5383 Other fatigue: Secondary | ICD-10-CM | POA: Diagnosis not present

## 2022-02-22 DIAGNOSIS — I10 Essential (primary) hypertension: Secondary | ICD-10-CM | POA: Insufficient documentation

## 2022-02-22 DIAGNOSIS — R519 Headache, unspecified: Secondary | ICD-10-CM | POA: Diagnosis not present

## 2022-02-22 DIAGNOSIS — G4733 Obstructive sleep apnea (adult) (pediatric): Secondary | ICD-10-CM | POA: Diagnosis not present

## 2022-02-22 DIAGNOSIS — Z6841 Body Mass Index (BMI) 40.0 and over, adult: Secondary | ICD-10-CM | POA: Diagnosis not present

## 2022-02-22 DIAGNOSIS — E669 Obesity, unspecified: Secondary | ICD-10-CM | POA: Insufficient documentation

## 2022-02-22 NOTE — Telephone Encounter (Signed)
Attempted to contact the pt to update her on her PA's LVM for a cb if needed

## 2022-02-27 ENCOUNTER — Ambulatory Visit: Payer: Medicaid Other | Admitting: Cardiology

## 2022-02-28 DIAGNOSIS — R400 Somnolence: Secondary | ICD-10-CM | POA: Diagnosis not present

## 2022-02-28 DIAGNOSIS — G4733 Obstructive sleep apnea (adult) (pediatric): Secondary | ICD-10-CM | POA: Diagnosis not present

## 2022-02-28 NOTE — Procedures (Signed)
Patient Name: Caitlin, [name] Date: 02/22/2022 Gender: Female D.O.B: 03-07-1970 Age (years): 82 Referring Provider: Simonne Maffucci Height (inches): 45 Interpreting Physician: Kara Mead MD, ABSM Weight (lbs): 377 RPSGT: Jorge Ny BMI: 59 MRN: [id number] Neck Size: 18.75 <br> <br> CLINICAL INFORMATION Sleep Study Type: NPSG    Indication for sleep study: Diabetes, Excessive Daytime Sleepiness, Fatigue, Hypertension, Morning Headaches, Obesity    Epworth Sleepiness Score: 9    SLEEP STUDY TECHNIQUE As per the AASM Manual for the Scoring of Sleep and Associated Events v2.3 (April 2016) with a hypopnea requiring 4% desaturations.  The channels recorded and monitored were frontal, central and occipital EEG, electrooculogram (EOG), submentalis EMG (chin), nasal and oral airflow, thoracic and abdominal wall motion, anterior tibialis EMG, snore microphone, electrocardiogram, and pulse oximetry.  MEDICATIONS Medications self-administered by patient taken the night of the study : Systane PF eye drops  SLEEP ARCHITECTURE The study was initiated at 9:59:25 PM and ended at 3:55:13 AM.  Sleep onset time was 28.5 minutes and the sleep efficiency was 28.8%. The total sleep time was 102.5 minutes.  Stage REM latency was 298.5 minutes.  The patient spent 23.4% of the night in stage N1 sleep, 62.4% in stage N2 sleep, 0.0% in stage N3 and 14.2% in REM.  Alpha intrusion was absent.  Supine sleep was 100.00%.  RESPIRATORY PARAMETERS The overall apnea/hypopnea index (AHI) was 77.3 per hour. There were 24 total apneas, including 24 obstructive, 0 central and 0 mixed apneas. There were 108 hypopneas and 16 RERAs.  The AHI during Stage REM sleep was 95.2 per hour.  AHI while supine was 77.3 per hour.  The mean oxygen saturation was 90.7%. The minimum SpO2 during sleep was 73.0%.  soft snoring was noted during this study.  CARDIAC DATA The 2 lead EKG demonstrated sinus  rhythm. The mean heart rate was 74.5 beats per minute. Other EKG findings include: None.  LEG MOVEMENT DATA The total PLMS were 0 with a resulting PLMS index of 0.0. Associated arousal with leg movement index was 0.0 .  IMPRESSIONS - Severe obstructive sleep apnea occurred during this study (AHI = 77.3/h). - Moderate oxygen desaturation was noted during this study (Min O2 = 73.0%). The study was performed on 2L O2 - The patient snored with soft snoring volume. - No cardiac abnormalities were noted during this study. - Clinically significant periodic limb movements did not occur during sleep. No significant associated arousals. - She did nt meet split night protocol due to insufficient sleep with TST 102 mins due to pain in her eye.   DIAGNOSIS - Obstructive Sleep Apnea (G47.33) - Nocturnal Hypoxemia (G47.36)   RECOMMENDATIONS - Therapeutic CPAP titration to determine optimal pressure required to alleviate sleep disordered breathing. May need BiPAP. - 2l O2 can be continued during sleep - Avoid alcohol, sedatives and other CNS depressants that may worsen sleep apnea and disrupt normal sleep architecture. - Sleep hygiene should be reviewed to assess factors that may improve sleep quality. - Weight management and regular exercise should be initiated or continued if appropriate.  Kara Mead MD Board Certified in [place]

## 2022-03-07 ENCOUNTER — Other Ambulatory Visit (HOSPITAL_COMMUNITY): Payer: Self-pay

## 2022-03-07 NOTE — Telephone Encounter (Signed)
Pharmacy Patient Advocate Encounter  [phone] prior [facility] request for [facility] 3 sensors. Per Dr. Jodelle Green notes under the pt's PLAN: If her blood sugars are trending below 100 before dinner she will reduce her morning insulin 10 units and if the morning sugars are below 100 reduce the evening dose also 10 units.  New PA request # [id number]. Determination should be made within 24 hrs.

## 2022-03-11 ENCOUNTER — Ambulatory Visit: Payer: Medicaid Other | Admitting: Pulmonary Disease

## 2022-03-20 ENCOUNTER — Encounter: Payer: Self-pay | Admitting: Pulmonary Disease

## 2022-03-26 NOTE — Progress Notes (Deleted)
Cardiology Office Note:    Date:  03/26/2022   ID:  [name], DOB 1970-10-17, MRN [id number]  PCP:  Vicenta Aly, [facility]   [facility] Providers Cardiologist:  Kirk Ruths, MD      Referring MD: Vicenta Aly, FNP   Follow-up evaluation of her acute on chronic diastolic CHF, and CAD.  History of Present Illness:    [name] is a 52 y.o. female with a hx of hypertension, GERD, depression, hypothyroidism, morbid obesity, uncontrolled type 2 diabetes, and chronic diastolic CHF.   She was admitted to hospital with chest discomfort and on 03/28/2020.  Her high-sensitivity troponins were elevated at 514 and climbed to over 2000.  Her EKG showed nonspecific changes.  She was diagnosed with NSTEMI and taken to the Cath Lab.  Cardiac catheterization showed 25% distal RCA, mid LAD 95%, mid LAD-to 50%, moderately elevated LVEDP at 27 mmHg.  She received PCI with DES x1 to her mid LAD.  Her echocardiogram at that time showed normal LVEF, septal/apical hypokinesis, normal diastolic function, no valvular abnormalities and normal right ventricular function.   She followed up with heart and vascular transition of care clinic 04/06/2020.  At that time she reported she felt achy since being discharged from the hospital.  She denied chest pain except with deep inhalation.  Her discomfort resolved with exhalation.  She reported overall feeling better.  Her medications were reviewed.  It was noted that her pillbox was not in order, containing days with missing doses of certain medications and multiple doses on other days.  She reported having very poor vision and having difficulty with her pill bottles.   She presented to the clinic 06/06/20 for follow-up evaluation stated she felt fairly well.  She did report that she had increased anxiety related to doctors visits and medical procedures.  She ha noticed occasional brief episodes of sharp chest pain.  She reported that the pain could be  reproduced with pressing over the area.  I educated her that this was related to muscle wall pain.  She expressed understanding.  She reported that she did have occasional brief episodes of palpitations.  She reported that these would happen a couple times a week.  She also reported that she had presented to the emergency department with complaints of lower extremity swelling.  She was instructed to take extra doses of her furosemide.  She reported that she followed a low-sodium diet and restricted her fluid.  She was breathing much better with 2 L of oxygen via nasal cannula.  We gave  her the [place] support stocking sheet, salty 6 diet sheet, have her increase her physical activity as tolerated, continue current medication regimen and order a BMP.   Follow-up was planned for 6 months with Dr. Stanford Breed.  She presented to the clinic 10/18/20 for follow-up evaluation stated she had noticed that she was more fatigued over the last several months.  She noticed that she continued to need her oxygen.  She reported that when she was asleep at night and her oxygen would fall off her saturation will drop into the 70s.   She was maintaining a heart healthy low-sodium diet.  She reported that she did have some lower extremity numbness and that she had a herniated lumbar disc.  She continued to have some right-sided/shoulder area discomfort as well as left-sided shoulder discomfort.  Appeared to be related to deconditioning and musculoskeletal pain related to the use of her walker.  Her weight had increased  about 10-11 pounds.  She denied  bleeding issues.  She performed a walking test without her oxygen and her oxygen saturation dropped to 87 after only ambulating 10-12 feet.  I ordered a sleep study, sent my note to her insurance company so that she may maintain her oxygen therapy, asked to her increase her physical activity, repeated an echocardiogram, and planned follow-up after her sleep study.  We reviewed the  importance of weight loss and increasing physical activity.  I recommended that she start doing chair type exercises multiple times per day.  She was seen in follow-up by Dr. Stanford Breed on 03/28/2021.  Her echocardiogram 10/22 showed normal LV function, mid left ventricular enlargement, mild left ventricular hypertrophy.  She reported multiple complaints.  She noted dyspnea on exertion.  She described bilateral lower extremity edema which was worse with her legs down.  She noted occasional pain in her right chest that was unlike her previous infarct pain.  She denied syncope.  Her Lasix was continued.  She was instructed to follow-up with pulmonology for her chronic dyspnea/respiratory failure and for possible sleep apnea.  Her rosuvastatin was increased to 40 mg daily with plan to repeat lipids and LFTs in 8 weeks.  She was seen in the emergency department on 07/22/2021.  She complained of shortness of breath and chest discomfort.  She was also noted to have increased lower extremity fluid.  She was wearing 2.5 L nasal cannula and denied fever and chills.  She reported a cough x5 days which was nonproductive.  Her chest x-ray showed no acute cardiopulmonary disease.  Her high-sensitivity troponins were low and flat.  Her BNP was 50.  She received IV Lasix in the emergency department and urinated around 2 L.  She reported that her breathing was significantly improved.  She was advised to continue her home furosemide.  She presented to the clinic 08/20/2021 for follow-up evaluation stated she continued to use 2 L of oxygen while she is away from home and 2-1/2 L of oxygen at home.  She reported intermittent periods of shortness of breath with laying back.  She remains limited in her physical activity due to back pain.  She reported that she was trying to wean oxygen but was unable to titrate down.  We reviewed her echocardiogram and her pumping function.  She continues to be compliant with fluid restriction.  We  reviewed the importance of daily weights and contacting the office with a weight increase of 2 to 3 pounds overnight or 5 pounds in 1 week.  I  refered her to pulmonology for evaluation of her lungs and recommendations for coming off oxygen/treatment.  I reviewed the importance of lower extremity support stockings.  Follow-up in 6 months was planned.   She presents to the clinic today for follow-up evaluation and states***  Today she denies chest pain, increased shortness of breath, increased lower extremity edema, fatigue, palpitations, melena, hematuria, hemoptysis, diaphoresis, weakness, presyncope, syncope, orthopnea, and PND.   Chronic diastolic CHF-weight today 368*** pounds.  Continues to have generalized bilateral lower extremity edema.   Chronic dyspnea stable. Continue spironolactone, potassium, furosemide, metoprolol Heart healthy low-sodium diet-reviewed Increase physical activity as tolerated-limitations due to back pain Daily weights-reviewed Continue lower extremity support stockings Elevate lower extremities when not active.  Fatigue/DOE, OSA-  Continues with 2-3 L of oxygen saturating in the high to mid 90s and 2 and half liters at home.  Patient has been unable to wean oxygen.  Was seen and evaluated  by pulmonology.  She was noted to have chronic hypoxemic respiratory failure and nocturnal hypoxemia.  Underwent sleep study and was diagnosed with OSA. Continue CPAP use*** Continue 2 -3L of O2 as needed Follows with pulmonology  Coronary artery disease-no recent episodes of chest pain.  Chronic right-sided chest discomfort, stable.  Atypical in nature.  Underwent    cardiac catheterization 03/28/2020 which showed LAD lesions x2.  She received mid LAD PCI and DES x1.  Details above. Continue aspirin,  rosuvastatin, metoprolol Heart healthy low-sodium diet-reviewed Increase physical activity as tolerated    Essential hypertension-BP today ***122/58.  Continue metoprolol,  spironolactone Heart healthy low-sodium diet Increase physical activity as tolerated    Hyperlipidemia-03/28/2020: Cholesterol 106; HDL 27; LDL Cholesterol 57; Triglycerides 110; VLDL 22 Continue rosuvastatin Heart healthy low-sodium high-fiber diet Increase physical activity as tolerated Repeat fasting lipids and LFTs   Type 2 diabetes- glucose 148 on 07/21/21 Continue Novolin, metformin Follows with PCP   Morbid obesity-weight today ***368 pounds.  Continues to be limited her mobility due to low back pain and lower extremity numbness. Continue weight loss Follows with PCP   Disposition: Follow-up with Dr. Stanford Breed in 6-9 months.    Past Medical History:  Diagnosis Date   CAD (coronary artery disease)    a. s/p NSTEMI, LHC 03/2020 dRCA 25%, m-LAD-1 95%-0% (DES), mLAD-2 50%, LVEDP moderately elevated, 27 mmHg.   Chronic diastolic heart failure ([place])    a. LHC/echo 3/22 c/ elevated LVEDP, EF 50-55%, hypokinesis; b. Echo 10/22 EF 60-65%, mild LVH, borderline dilatation of ascending aorta, 55mm.   DDD (degenerative disc disease), lumbar    Diabetes mellitus without complication ([place])    GERD (gastroesophageal reflux disease)    Hyperlipidemia    Hypertension    Hypothyroidism    Lumbar herniated disc    Major depressive disorder    Morbid obesity ([place])     Past Surgical History:  Procedure Laterality Date   CORONARY STENT INTERVENTION N/A 03/28/2020   Procedure: CORONARY STENT INTERVENTION;  Surgeon: Jettie Booze, MD;  Location: [place] [facility];  Service: Cardiovascular;  Laterality: N/A;   DILATION AND CURETTAGE OF UTERUS     INCISION AND DRAINAGE PERIRECTAL ABSCESS  06/21/2014   INCISION AND DRAINAGE PERIRECTAL ABSCESS Bilateral 06/21/2014   Procedure: IRRIGATION AND DEBRIDEMENT PERIRECTAL ABSCESS;  Surgeon: Coralie Keens, MD;  Location: [facility];  Service: General;  Laterality: Bilateral;   LEFT HEART CATH AND CORONARY ANGIOGRAPHY N/A 03/28/2020   Procedure: LEFT  HEART CATH AND CORONARY ANGIOGRAPHY;  Surgeon: Jettie Booze, MD;  Location: [place] [facility];  Service: Cardiovascular;  Laterality: N/A;    Current Medications: No outpatient medications have been marked as taking for the 03/28/22 encounter (Appointment) with Deberah Pelton, NP.     Allergies:   Irbesartan, Clindamycin/lincomycin, Latex, and Ultram [tramadol]   Social History   Socioeconomic History   Marital status: Single    Spouse name: Not on file   Number of children: Not on file   Years of education: Not on file   Highest education level: Not on file  Occupational History   Not on file  Tobacco Use   Smoking status: Never   Smokeless tobacco: Never  Vaping Use   Vaping Use: Never used  Substance and Sexual Activity   Alcohol use: No   Drug use: No   Sexual activity: Not on file  Other Topics Concern   Not on file  Social History Narrative   Not  on file   Social Determinants of Health   Financial Resource Strain: Medium Risk (03/29/2020)   Overall Financial Resource Strain (CARDIA)    Difficulty of Paying Living Expenses: Somewhat hard  Food Insecurity: No Food Insecurity (03/29/2020)   Hunger Vital Sign    Worried About Running Out of Food in the Last Year: Never true    Ran Out of Food in the Last Year: Never true  Transportation Needs: No Transportation Needs (03/29/2020)   PRAPARE - Hydrologist (Medical): No    Lack of Transportation (Non-Medical): No  Physical Activity: Inactive (03/29/2020)   Exercise Vital Sign    Days of Exercise per Week: 0 days    Minutes of Exercise per Session: 0 min  Stress: Not on file  Social Connections: Not on file     Family History: The patient's family history includes Diabetes in her father and mother. There is no history of Heart disease.  ROS:   Please see the history of present illness.     All other systems reviewed and are negative.   Risk Assessment/Calculations:            Physical Exam:    VS:  There were no vitals taken for this visit.    Wt Readings from Last 3 Encounters:  02/22/22 (!) 377 lb (171 kg)  02/13/22 (!) 377 lb 6.4 oz (171.2 kg)  10/31/21 (!) 370 lb (167.8 kg)     GEN:  Well nourished, well developed in no acute distress HEENT: Normal NECK: No JVD; No carotid bruits LYMPHATICS: No lymphadenopathy CARDIAC: RRR, no murmurs, rubs, gallops ***generalized bilateral lower extremity edema left greater than right RESPIRATORY:  Clear to auscultation without rales, wheezing or rhonchi  ABDOMEN: Soft, non-tender, non-distended MUSCULOSKELETAL:  No edema; No deformity  SKIN: Warm and dry NEUROLOGIC:  Alert and oriented x 3 PSYCHIATRIC:  Normal affect    EKGs/Labs/Other Studies Reviewed:    The following studies were reviewed today:  Echocardiogram 03/28/2020 IMPRESSIONS     1. Overall poor image quality even with definity Septal and apical  hypokinesis . Left ventricular ejection fraction, by estimation, is 50 to  55%. The left ventricle has low normal function. The left ventricle has no  regional wall motion abnormalities.  Left ventricular diastolic parameters were normal.   2. Right ventricular systolic function is normal. The right ventricular  size is normal.   3. Left atrial size was mildly dilated.   4. The mitral valve is normal in structure. No evidence of mitral valve  regurgitation. No evidence of mitral stenosis.   5. The aortic valve was not well visualized. Aortic valve regurgitation  is not visualized. No aortic stenosis is present.   6. The inferior vena cava is normal in size with greater than 50%  respiratory variability, suggesting right atrial pressure of 3 mmHg.   Echocardiogram 11/10/2020  IMPRESSIONS     1. Left ventricular ejection fraction, by estimation, is 60 to 65%. The  left ventricle has normal function. The left ventricle has no regional  wall motion abnormalities. The left ventricular internal  cavity size was  mildly dilated. There is mild left  ventricular hypertrophy. Left ventricular diastolic parameters were  normal.   2. Right ventricular systolic function is normal. The right ventricular  size is normal.   3. The mitral valve is normal in structure. Trivial mitral valve  regurgitation. No evidence of mitral stenosis.   4. The aortic valve is normal  in structure. Aortic valve regurgitation is  not visualized. No aortic stenosis is present.   5. Aortic dilatation noted. There is borderline dilatation of the  ascending aorta, measuring 39 mm.   6. The inferior vena cava is normal in size with greater than 50%  respiratory variability, suggesting right atrial pressure of 3 mmHg.   Comparison(s): EF 50 %, hypokinesis.  Cardiac catheterization 03/28/2020 Dist RCA lesion is 25% stenosed. Mid LAD-1 lesion is 95% stenosed. A drug-eluting stent was successfully placed using a STENT RESOLUTE ONYX [id number]. The proximal stent was postdilated with a 3.75 mm [place] baloon. Post intervention, there is a 0% residual stenosis. Mid LAD-2 lesion is 50% stenosed. LV end diastolic pressure is moderately elevated. LVEDP 27 mm Hg. There is no aortic valve stenosis.   Continue dual antiplatelet therapy for 12 months. Consider clopidogrel monotherapy after 12 months, along with aggressive secondary prevention.     Increase statin to high potency.     Continue with plans for diuresis.  D/w Dr. Stanford Breed.  Diagnostic Dominance: Right     Intervention          EKG: None today.  Recent Labs: 04/07/2021: Magnesium 1.8 07/21/2021: Hemoglobin 10.2; Platelets 207 07/22/2021: B Natriuretic Peptide 50.6 11/15/2021: Pro B Natriuretic peptide (BNP) 13.0 02/13/2022: ALT 27; BUN 28; Creatinine, Ser 1.04; Potassium 3.5; Sodium 141; TSH 2.62  Recent Lipid Panel    Component Value Date/Time   CHOL 121 05/31/2021 1220   TRIG 175.0 (H) 05/31/2021 1220   HDL 38.60 (L) 05/31/2021 1220   CHOLHDL 3  05/31/2021 1220   VLDL 35.0 05/31/2021 1220   LDLCALC 48 05/31/2021 1220    ASSESSMENT & PLAN   1.***      Medication Adjustments/Labs and Tests Ordered: Current medicines are reviewed at length with the patient today.  Concerns regarding medicines are outlined above.  No orders of the defined types were placed in this encounter.  No orders of the defined types were placed in this encounter.    There are no Patient Instructions on file for this visit.   Signed, Deberah Pelton, NP  03/26/2022 6:05 PM      Notice: This dictation was prepared with Dragon dictation along with smaller phrase technology. Any transcriptional errors that result from this process are unintentional and may not be corrected upon review.  I spent 15*** minutes examining this patient, reviewing medications, and using patient centered shared decision making involving her cardiac care.  Prior to her visit I spent greater than 20 minutes reviewing her past medical history,  medications, and prior cardiac tests.

## 2022-03-28 ENCOUNTER — Ambulatory Visit: Payer: Medicaid Other | Admitting: General Practice

## 2022-03-29 ENCOUNTER — Ambulatory Visit: Payer: Medicaid Other | Admitting: Pulmonary Disease

## 2022-04-08 ENCOUNTER — Ambulatory Visit: Payer: Medicaid Other | Admitting: Pulmonary Disease

## 2022-04-10 ENCOUNTER — Other Ambulatory Visit: Payer: Self-pay | Admitting: Endocrinology

## 2022-04-19 ENCOUNTER — Ambulatory Visit: Payer: Medicaid Other | Admitting: Endocrinology

## 2022-04-19 ENCOUNTER — Ambulatory Visit: Payer: Medicaid Other | Admitting: Pulmonary Disease

## 2022-04-19 ENCOUNTER — Encounter: Payer: Self-pay | Admitting: Pulmonary Disease

## 2022-04-19 VITALS — BP 122/64 | HR 75 | Temp 98.3°F | Ht 66.0 in | Wt 369.6 lb

## 2022-04-19 DIAGNOSIS — J9611 Chronic respiratory failure with hypoxia: Secondary | ICD-10-CM

## 2022-04-19 DIAGNOSIS — G4733 Obstructive sleep apnea (adult) (pediatric): Secondary | ICD-10-CM

## 2022-04-19 NOTE — Progress Notes (Signed)
[name]    VM:[id number]    July 24, 1970  Primary Care Physician:Anderson, Helene Kelp, [place]  Referring Physician: Vicenta Aly, [place],  [place] [address]  Chief complaint: Follow-up for chronic hypoxic respiratory failure, sleep apnea  HPI: 52 y.o. who  has a past medical history of CAD (coronary artery disease), Chronic diastolic heart failure ([place]), DDD (degenerative disc disease), lumbar, Diabetes mellitus without complication ([place]), GERD (gastroesophageal reflux disease), Hyperlipidemia, Hypertension, Hypothyroidism, Lumbar herniated disc, Major depressive disorder, and Morbid obesity ([place]).   Originally seen in October 2023 for dyspnea, chronic hypoxic respiratory failure.  She has history significant for coronary artery disease s/p stenting of her LAD in 2021 after non-ST elevation MI.  She has been on oxygen therapy since then.  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  No feather pillows or comforters Smoking history: Never smoker Travel history: Originally from [place].  No recent travel Relevant family history: No family history of lung disease   Outpatient Encounter Medications as of 04/19/2022  Medication Sig   acetaminophen (TYLENOL) 500 MG tablet Take 1,000 mg by mouth every 6 (six) hours as needed for moderate pain or headache.   aspirin EC 81 MG tablet Take 81 mg by mouth daily. Swallow whole.   B Complex-C (SUPER B COMPLEX PO) Take 1 tablet by mouth daily.   Cholecalciferol (VITAMIN D3) 50 MCG (2000 UT) capsule Take 2,000 Units by mouth daily.   Continuous Blood Gluc Sensor (FREESTYLE LIBRE 3 SENSOR) MISC 1 Device by Does not apply route every 14 (fourteen) days. Apply 1 sensor on upper arm every 14 days for continuous glucose monitoring   empagliflozin (JARDIANCE) 25 MG TABS tablet  Take 1 tablet (25 mg total) by mouth daily before breakfast.   furosemide (LASIX) 40 MG tablet Take 1 tablet (40 mg total) by mouth daily.   gabapentin (NEURONTIN) 300 MG capsule Take 2 capsules by mouth 3 (three) times daily.   insulin NPH-regular Human (HUMULIN 70/30) (70-30) 100 UNIT/ML injection 150 units with breakfast, and 125 units with supper   Insulin Syringes, Disposable, U-100 1 ML MISC Use to inject insulin daily   levothyroxine (SYNTHROID, LEVOTHROID) 50 MCG tablet Take 50 mcg by mouth daily.   metFORMIN (GLUCOPHAGE-XR) 500 MG 24 hr tablet TAKE 4 TABLETS BY MOUTH ONCE DAILY   metoprolol succinate (TOPROL-XL) 50 MG 24 hr tablet TAKE 1 TABLET BY MOUTH ONCE DAILY (TAKE  WITH  OR  IMMEDIATELY  FOLLOWING  A  MEAL)   nystatin cream (MYCOSTATIN) Apply 1 application  topically 2 (two) times daily. Abdominal folds   omeprazole (PRILOSEC) 40 MG capsule Take 40 mg by mouth 2 (two) times daily.   potassium chloride (KLOR-CON) 10 MEQ tablet Take 2 tablets (20 mEq total) by mouth daily.   rosuvastatin (CRESTOR) 40 MG tablet Take 40 mg by mouth daily.   Sennosides 17.2 MG TABS Take 34.4 mg by mouth 3 (three) times a week. No set days   No facility-administered encounter medications on file as of 04/19/2022.    Allergies as of 04/19/2022 - Review Complete 04/19/2022  Allergen Reaction Noted   Irbesartan Other (See Comments) 04/06/2021   Clindamycin/lincomycin Other (See Comments) and Nausea And Vomiting 02/25/2017   Latex Rash 06/21/2014  Ultram [tramadol] Rash 06/21/2014    Past Medical History:  Diagnosis Date   CAD (coronary artery disease)    a. s/p NSTEMI, LHC 03/2020 dRCA 25%, m-LAD-1 95%-0% (DES), mLAD-2 50%, LVEDP moderately elevated, 27 mmHg.   Chronic diastolic heart failure ([place])    a. LHC/echo 3/22 c/ elevated LVEDP, EF 50-55%, hypokinesis; b. Echo 10/22 EF 60-65%, mild LVH, borderline dilatation of ascending aorta, 53mm.   DDD (degenerative disc disease), lumbar    Diabetes  mellitus without complication ([place])    GERD (gastroesophageal reflux disease)    Hyperlipidemia    Hypertension    Hypothyroidism    Lumbar herniated disc    Major depressive disorder    Morbid obesity ([place])     Past Surgical History:  Procedure Laterality Date   CORONARY STENT INTERVENTION N/A 03/28/2020   Procedure: CORONARY STENT INTERVENTION;  Surgeon: Jettie Booze, MD;  Location: [facility];  Service: Cardiovascular;  Laterality: N/A;   DILATION AND CURETTAGE OF UTERUS     INCISION AND DRAINAGE PERIRECTAL ABSCESS  06/21/2014   INCISION AND DRAINAGE PERIRECTAL ABSCESS Bilateral 06/21/2014   Procedure: IRRIGATION AND DEBRIDEMENT PERIRECTAL ABSCESS;  Surgeon: Coralie Keens, MD;  Location: [facility];  Service: General;  Laterality: Bilateral;   LEFT HEART CATH AND CORONARY ANGIOGRAPHY N/A 03/28/2020   Procedure: LEFT HEART CATH AND CORONARY ANGIOGRAPHY;  Surgeon: Jettie Booze, MD;  Location: [facility];  Service: Cardiovascular;  Laterality: N/A;    Family History  Problem Relation Age of Onset   Diabetes Mother    Diabetes Father    Heart disease Neg Hx     Social History   Socioeconomic History   Marital status: Single    Spouse name: Not on file   Number of children: Not on file   Years of education: Not on file   Highest education level: Not on file  Occupational History   Not on file  Tobacco Use   Smoking status: Never    Passive exposure: Past   Smokeless tobacco: Never  Vaping Use   Vaping Use: Never used  Substance and Sexual Activity   Alcohol use: No   Drug use: No   Sexual activity: Not on file  Other Topics Concern   Not on file  Social History Narrative   Not on file   Social Determinants of Health   Financial Resource Strain: Medium Risk (03/29/2020)   Overall Financial Resource Strain (CARDIA)    Difficulty of Paying Living Expenses: Somewhat hard  Food Insecurity: No Food Insecurity (03/29/2020)   Hunger Vital Sign     Worried About Running Out of Food in the Last Year: Never true    [place] in the Last Year: Never true  Transportation Needs: No Transportation Needs (03/29/2020)   PRAPARE - Hydrologist (Medical): No    Lack of Transportation (Non-Medical): No  Physical Activity: Inactive (03/29/2020)   Exercise Vital Sign    Days of Exercise per Week: 0 days    Minutes of Exercise per Session: 0 min  Stress: Not on file  Social Connections: Not on file  Intimate Partner Violence: Not on file    Review of systems: Review of Systems  Constitutional: Negative for fever and chills.  HENT: Negative.   Eyes: Negative for blurred vision.  Respiratory: as per HPI  Cardiovascular: Negative for chest pain and palpitations.  Gastrointestinal: Negative for vomiting, diarrhea, blood per rectum. Genitourinary:  Negative for dysuria, urgency, frequency and hematuria.  Musculoskeletal: Negative for myalgias, back pain and joint pain.  Skin: Negative for itching and rash.  Neurological: Negative for dizziness, tremors, focal weakness, seizures and loss of consciousness.  Endo/Heme/Allergies: Negative for environmental allergies.  Psychiatric/Behavioral: Negative for depression, suicidal ideas and hallucinations.  All other systems reviewed and are negative.  Physical Exam: Blood pressure 122/64, pulse 75, temperature 98.3 F (36.8 C), temperature source Oral, height 5\' 6"  (1.676 m), weight (!) 369 lb 9.6 oz (167.6 kg), SpO2 100 %. Gen:      No acute distress, obese HEENT:  EOMI, sclera anicteric Neck:     No masses; no thyromegaly Lungs:    Clear to auscultation bilaterally; normal respiratory effort CV:         Regular rate and rhythm; no murmurs Abd:      + bowel sounds; soft, non-tender; no palpable masses, no distension Ext:    2+ edema; adequate peripheral perfusion Skin:      Warm and dry; no rash Neuro: alert and oriented x 3 Psych: normal mood and affect  Data  Reviewed: Imaging: Chest x-ray 07/21/2021-no active cardiopulmonary disease Chest x-ray 11/15/2021-no active cardiopulmonary disease I had reviewed the images personally.  PFTs: 11/15/2021 FEV1 2.47 [82%], F/F93  Labs:  Sleep: Sleep study 02/22/2022 Severe obstructive sleep apnea [AHI 77.3], moderate oxygen desaturation to 73%  Assessment:  Chronic hypoxic respiratory failure Likely secondary to chronic congestive heart failure, obesity.  Follows with cardiology and is on Lasix Continue supplemental oxygen  Sleep apnea Recently had a sleep study with severe obstructive sleep apnea and moderate oxygen desaturation.  I suspect she has obesity hypoventilation syndrome as there is elevated bicarb on labs She will need a BiPAP titration study  Plan/Recommendations: BiPAP titration study Continue supplemental oxygen  Marshell Garfinkel MD [facility] 04/19/2022, 10:06 AM  CC: Vicenta Aly, FNP

## 2022-04-19 NOTE — Progress Notes (Deleted)
Jaelynne Pollins    VM:[id number]    Jul 14, 1970  Primary Care Physician:Anderson, Helene Kelp, [place]  Referring Physician: Vicenta Aly, [place],  [place] [address]  Chief complaint:    HPI: 52 y.o. who  has a past medical history of CAD (coronary artery disease), Chronic diastolic heart failure ([place]), DDD (degenerative disc disease), lumbar, Diabetes mellitus without complication ([place]), GERD (gastroesophageal reflux disease), Hyperlipidemia, Hypertension, Hypothyroidism, Lumbar herniated disc, Major depressive disorder, and Morbid obesity ([place]).     Pets: Occupation: Exposures: Smoking history: Travel history: Relevant family history:   Outpatient Encounter Medications as of 04/19/2022  Medication Sig   acetaminophen (TYLENOL) 500 MG tablet Take 1,000 mg by mouth every 6 (six) hours as needed for moderate pain or headache.   aspirin EC 81 MG tablet Take 81 mg by mouth daily. Swallow whole.   B Complex-C (SUPER B COMPLEX PO) Take 1 tablet by mouth daily.   Cholecalciferol (VITAMIN D3) 50 MCG (2000 UT) capsule Take 2,000 Units by mouth daily.   Continuous Blood Gluc Sensor (FREESTYLE LIBRE 3 SENSOR) MISC 1 Device by Does not apply route every 14 (fourteen) days. Apply 1 sensor on upper arm every 14 days for continuous glucose monitoring   empagliflozin (JARDIANCE) 25 MG TABS tablet Take 1 tablet (25 mg total) by mouth daily before breakfast.   furosemide (LASIX) 40 MG tablet Take 1 tablet (40 mg total) by mouth daily.   gabapentin (NEURONTIN) 300 MG capsule Take 2 capsules by mouth 3 (three) times daily.   insulin NPH-regular Human (HUMULIN 70/30) (70-30) 100 UNIT/ML injection 150 units with breakfast, and 125 units with supper   Insulin Syringes, Disposable, U-100 1 ML MISC Use to inject insulin daily   levothyroxine (SYNTHROID, LEVOTHROID) 50 MCG tablet Take 50 mcg by mouth daily.   metFORMIN (GLUCOPHAGE-XR) 500 MG 24 hr tablet TAKE 4  TABLETS BY MOUTH ONCE DAILY   metoprolol succinate (TOPROL-XL) 50 MG 24 hr tablet TAKE 1 TABLET BY MOUTH ONCE DAILY (TAKE  WITH  OR  IMMEDIATELY  FOLLOWING  A  MEAL)   nystatin cream (MYCOSTATIN) Apply 1 application  topically 2 (two) times daily. Abdominal folds   omeprazole (PRILOSEC) 40 MG capsule Take 40 mg by mouth 2 (two) times daily.   potassium chloride (KLOR-CON) 10 MEQ tablet Take 2 tablets (20 mEq total) by mouth daily.   rosuvastatin (CRESTOR) 40 MG tablet Take 40 mg by mouth daily.   Sennosides 17.2 MG TABS Take 34.4 mg by mouth 3 (three) times a week. No set days   No facility-administered encounter medications on file as of 04/19/2022.    Allergies as of 04/19/2022 - Review Complete 04/19/2022  Allergen Reaction Noted   Irbesartan Other (See Comments) 04/06/2021   Clindamycin/lincomycin Other (See Comments) and Nausea And Vomiting 02/25/2017   Latex Rash 06/21/2014   Ultram [tramadol] Rash 06/21/2014    Past Medical History:  Diagnosis Date   CAD (coronary artery disease)    a. s/p NSTEMI, LHC 03/2020 dRCA 25%, m-LAD-1 95%-0% (DES), mLAD-2 50%, LVEDP moderately elevated, 27 mmHg.   Chronic diastolic heart failure ([place])    a. LHC/echo 3/22 c/ elevated LVEDP, EF 50-55%, hypokinesis; b. Echo 10/22 EF 60-65%, mild LVH, borderline dilatation of ascending aorta, 35mm.   DDD (degenerative disc disease), lumbar    Diabetes mellitus without complication ([place])    GERD (gastroesophageal reflux disease)    Hyperlipidemia    Hypertension  Hypothyroidism    Lumbar herniated disc    Major depressive disorder    Morbid obesity ([place])     Past Surgical History:  Procedure Laterality Date   CORONARY STENT INTERVENTION N/A 03/28/2020   Procedure: CORONARY STENT INTERVENTION;  Surgeon: Jettie Booze, MD;  Location: [facility];  Service: Cardiovascular;  Laterality: N/A;   DILATION AND CURETTAGE OF UTERUS     INCISION AND DRAINAGE PERIRECTAL ABSCESS  06/21/2014    INCISION AND DRAINAGE PERIRECTAL ABSCESS Bilateral 06/21/2014   Procedure: IRRIGATION AND DEBRIDEMENT PERIRECTAL ABSCESS;  Surgeon: Coralie Keens, MD;  Location: [facility];  Service: General;  Laterality: Bilateral;   LEFT HEART CATH AND CORONARY ANGIOGRAPHY N/A 03/28/2020   Procedure: LEFT HEART CATH AND CORONARY ANGIOGRAPHY;  Surgeon: Jettie Booze, MD;  Location: [facility];  Service: Cardiovascular;  Laterality: N/A;    Family History  Problem Relation Age of Onset   Diabetes Mother    Diabetes Father    Heart disease Neg Hx     Social History   Socioeconomic History   Marital status: Single    Spouse name: Not on file   Number of children: Not on file   Years of education: Not on file   Highest education level: Not on file  Occupational History   Not on file  Tobacco Use   Smoking status: Never    Passive exposure: Past   Smokeless tobacco: Never  Vaping Use   Vaping Use: Never used  Substance and Sexual Activity   Alcohol use: No   Drug use: No   Sexual activity: Not on file  Other Topics Concern   Not on file  Social History Narrative   Not on file   Social Determinants of Health   Financial Resource Strain: Medium Risk (03/29/2020)   Overall Financial Resource Strain (CARDIA)    Difficulty of Paying Living Expenses: Somewhat hard  Food Insecurity: No Food Insecurity (03/29/2020)   Hunger Vital Sign    Worried About Running Out of Food in the Last Year: Never true    [place] in the Last Year: Never true  Transportation Needs: No Transportation Needs (03/29/2020)   PRAPARE - Hydrologist (Medical): No    Lack of Transportation (Non-Medical): No  Physical Activity: Inactive (03/29/2020)   Exercise Vital Sign    Days of Exercise per Week: 0 days    Minutes of Exercise per Session: 0 min  Stress: Not on file  Social Connections: Not on file  Intimate Partner Violence: Not on file    Review of systems: Review of  Systems  Constitutional: Negative for fever and chills.  HENT: Negative.   Eyes: Negative for blurred vision.  Respiratory: as per HPI  Cardiovascular: Negative for chest pain and palpitations.  Gastrointestinal: Negative for vomiting, diarrhea, blood per rectum. Genitourinary: Negative for dysuria, urgency, frequency and hematuria.  Musculoskeletal: Negative for myalgias, back pain and joint pain.  Skin: Negative for itching and rash.  Neurological: Negative for dizziness, tremors, focal weakness, seizures and loss of consciousness.  Endo/Heme/Allergies: Negative for environmental allergies.  Psychiatric/Behavioral: Negative for depression, suicidal ideas and hallucinations.  All other systems reviewed and are negative.  Physical Exam: Blood pressure 122/64, pulse 75, temperature 98.3 F (36.8 C), temperature source Oral, height 5\' 6"  (1.676 m), weight (!) 369 lb 9.6 oz (167.6 kg), SpO2 100 %. Gen:      No acute distress HEENT:  EOMI,  sclera anicteric Neck:     No masses; no thyromegaly Lungs:    Clear to auscultation bilaterally; normal respiratory effort*** CV:         Regular rate and rhythm; no murmurs Abd:      + bowel sounds; soft, non-tender; no palpable masses, no distension Ext:    No edema; adequate peripheral perfusion Skin:      Warm and dry; no rash Neuro: alert and oriented x 3 Psych: normal mood and affect  Data Reviewed: Imaging:  PFTs:  Labs:  Assessment:  ***  Plan/Recommendations: *** This appointment required *** minutes of patient care (this includes precharting, chart review, review of results, face-to-face care, etc.).  Marshell Garfinkel MD [facility] 04/19/2022, 9:34 AM  CC: Vicenta Aly, FNP

## 2022-04-19 NOTE — Patient Instructions (Signed)
I have reviewed your sleep study which shows severe sleep apnea with low oxygen levels.  I suspect you have sleep apnea and a condition called obesity hypoventilation syndrome Will order BiPAP titration study for further evaluation Return to clinic in 1 to 2 months.

## 2022-05-06 ENCOUNTER — Other Ambulatory Visit (HOSPITAL_COMMUNITY): Payer: Self-pay

## 2022-05-08 NOTE — Progress Notes (Deleted)
Cardiology Office Note:    Date:  05/08/2022   ID:  [name], DOB [birth year], MRN [id number]  PCP:  Caitlin Palau, FNP   [facility] Providers Cardiologist:  Caitlin Millers, MD      Referring MD: Caitlin Palau, FNP   Follow-up evaluation of her acute on chronic diastolic CHF, and CAD.  History of Present Illness:    [name] is a 52 y.o. female with a hx of hypertension, GERD, depression, hypothyroidism, morbid obesity, uncontrolled type 2 diabetes, and chronic diastolic CHF.   She was admitted to hospital with chest discomfort and on 03/28/2020.  Her high-sensitivity troponins were elevated at 514 and climbed to over 2000.  Her EKG showed nonspecific changes.  She was diagnosed with NSTEMI and taken to the Cath Lab.  Cardiac catheterization showed 25% distal RCA, mid LAD 95%, mid LAD-to 50%, moderately elevated LVEDP at 27 mmHg.  She received PCI with DES x1 to her mid LAD.  Her echocardiogram at that time showed normal LVEF, septal/apical hypokinesis, normal diastolic function, no valvular abnormalities and normal right ventricular function.   She followed up with heart and vascular transition of care clinic 04/06/2020.  At that time she reported she felt achy since being discharged from the hospital.  She denied chest pain except with deep inhalation.  Her discomfort resolved with exhalation.  She reported overall feeling better.  Her medications were reviewed.  It was noted that her pillbox was not in order, containing days with missing doses of certain medications and multiple doses on other days.  She reported having very poor vision and having difficulty with her pill bottles.   She presented to the clinic 06/06/20 for follow-up evaluation stated she felt fairly well.  She did report that she had increased anxiety related to doctors visits and medical procedures.  She ha noticed occasional brief episodes of sharp chest pain.  She reported that the pain could be  reproduced with pressing over the area.  I educated her that this was related to muscle wall pain.  She expressed understanding.  She reported that she did have occasional brief episodes of palpitations.  She reported that these would happen a couple times a week.  She also reported that she had presented to the emergency department with complaints of lower extremity swelling.  She was instructed to take extra doses of her furosemide.  She reported that she followed a low-sodium diet and restricted her fluid.  She was breathing much better with 2 L of oxygen via nasal cannula.  We gave  her the [name] support stocking sheet, salty 6 diet sheet, have her increase her physical activity as tolerated, continue current medication regimen and order a BMP.   Follow-up was planned for 6 months with Dr. Jens Som.  She presented to the clinic 10/18/20 for follow-up evaluation stated she had noticed that she was more fatigued over the last several months.  She noticed that she continued to need her oxygen.  She reported that when she was asleep at night and her oxygen would fall off her saturation will drop into the 70s.   She was maintaining a heart healthy low-sodium diet.  She reported that she did have some lower extremity numbness and that she had a herniated lumbar disc.  She continued to have some right-sided/shoulder area discomfort as well as left-sided shoulder discomfort.  Appeared to be related to deconditioning and musculoskeletal pain related to the use of her walker.  Her weight had increased  about 10-11 pounds.  She denied  bleeding issues.  She performed a walking test without her oxygen and her oxygen saturation dropped to 87 after only ambulating 10-12 feet.  I ordered a sleep study, sent my note to her insurance company so that she may maintain her oxygen therapy, asked to her increase her physical activity, repeated an echocardiogram, and planned follow-up after her sleep study.  We reviewed the  importance of weight loss and increasing physical activity.  I recommended that she start doing chair type exercises multiple times per day.  She was seen in follow-up by Dr. Stanford Breed on 03/28/2021.  Her echocardiogram 10/22 showed normal LV function, mid left ventricular enlargement, mild left ventricular hypertrophy.  She reported multiple complaints.  She noted dyspnea on exertion.  She described bilateral lower extremity edema which was worse with her legs down.  She noted occasional pain in her right chest that was unlike her previous infarct pain.  She denied syncope.  Her Lasix was continued.  She was instructed to follow-up with pulmonology for her chronic dyspnea/respiratory failure and for possible sleep apnea.  Her rosuvastatin was increased to 40 mg daily with plan to repeat lipids and LFTs in 8 weeks.  She was seen in the emergency department on 07/22/2021.  She complained of shortness of breath and chest discomfort.  She was also noted to have increased lower extremity fluid.  She was wearing 2.5 L nasal cannula and denied fever and chills.  She reported a cough x5 days which was nonproductive.  Her chest x-ray showed no acute cardiopulmonary disease.  Her high-sensitivity troponins were low and flat.  Her BNP was 50.  She received IV Lasix in the emergency department and urinated around 2 L.  She reported that her breathing was significantly improved.  She was advised to continue her home furosemide.  She presents to the clinic today for follow-up evaluation states she continues to use 2 L of oxygen while she is away from home and 2-1/2 L of oxygen at home.  She reports intermittent periods of shortness of breath with laying back.  We reviewed her most recent emergency department visit.  She expressed understanding.  She remains limited in her physical activity due to back pain.  She reports that she was trying to wean oxygen but was unable to titrate down.  We reviewed her echocardiogram and her  pumping function.  She continues to be compliant with fluid restriction.  We reviewed the importance of daily weights and contacting the office with a weight increase of 2 to 3 pounds overnight or 5 pounds in 1 week.  I will refer her to pulmonology for evaluation of her lungs and recommendations for coming off oxygen/treatment.  I reviewed the importance of lower extremity support stockings, she has been tolerating lower extremity wraps and is unable to manage support stockings.  We will plan follow-up in 6 months.   Today she denies chest pain, increased shortness of breath, increased lower extremity edema, fatigue, palpitations, melena, hematuria, hemoptysis, diaphoresis, weakness, presyncope, syncope, orthopnea, and PND.    Past Medical History:  Diagnosis Date   CAD (coronary artery disease)    a. s/p NSTEMI, LHC 03/2020 dRCA 25%, m-LAD-1 95%-0% (DES), mLAD-2 50%, LVEDP moderately elevated, 27 mmHg.   Chronic diastolic heart failure ([place])    a. LHC/echo 3/22 c/ elevated LVEDP, EF 50-55%, hypokinesis; b. Echo 10/22 EF 60-65%, mild LVH, borderline dilatation of ascending aorta, 68m.   DDD (degenerative disc disease), lumbar  Diabetes mellitus without complication (HCC)    GERD (gastroesophageal reflux disease)    Hyperlipidemia    Hypertension    Hypothyroidism    Lumbar herniated disc    Major depressive disorder    Morbid obesity (HCC)     Past Surgical History:  Procedure Laterality Date   CORONARY STENT INTERVENTION N/A 03/28/2020   Procedure: CORONARY STENT INTERVENTION;  Surgeon: Corky Crafts, MD;  Location: [facility] INVASIVE CV LAB;  Service: Cardiovascular;  Laterality: N/A;   DILATION AND CURETTAGE OF UTERUS     INCISION AND DRAINAGE PERIRECTAL ABSCESS  06/21/2014   INCISION AND DRAINAGE PERIRECTAL ABSCESS Bilateral 06/21/2014   Procedure: IRRIGATION AND DEBRIDEMENT PERIRECTAL ABSCESS;  Surgeon: Abigail Miyamoto, MD;  Location: [facility] OR;  Service: General;  Laterality:  Bilateral;   LEFT HEART CATH AND CORONARY ANGIOGRAPHY N/A 03/28/2020   Procedure: LEFT HEART CATH AND CORONARY ANGIOGRAPHY;  Surgeon: Corky Crafts, MD;  Location: [facility] INVASIVE CV LAB;  Service: Cardiovascular;  Laterality: N/A;    Current Medications: No outpatient medications have been marked as taking for the 05/10/22 encounter (Appointment) with Ronney Asters, NP.     Allergies:   Irbesartan, Clindamycin/lincomycin, Latex, and Ultram [tramadol]   Social History   Socioeconomic History   Marital status: Single    Spouse name: Not on file   Number of children: Not on file   Years of education: Not on file   Highest education level: Not on file  Occupational History   Not on file  Tobacco Use   Smoking status: Never    Passive exposure: Past   Smokeless tobacco: Never  Vaping Use   Vaping Use: Never used  Substance and Sexual Activity   Alcohol use: No   Drug use: No   Sexual activity: Not on file  Other Topics Concern   Not on file  Social History Narrative   Not on file   Social Determinants of Health   Financial Resource Strain: Medium Risk (03/29/2020)   Overall Financial Resource Strain (CARDIA)    Difficulty of Paying Living Expenses: Somewhat hard  Food Insecurity: No Food Insecurity (03/29/2020)   Hunger Vital Sign    Worried About Running Out of Food in the Last Year: Never true    Ran Out of Food in the Last Year: Never true  Transportation Needs: No Transportation Needs (03/29/2020)   PRAPARE - Administrator, Civil Service (Medical): No    Lack of Transportation (Non-Medical): No  Physical Activity: Inactive (03/29/2020)   Exercise Vital Sign    Days of Exercise per Week: 0 days    Minutes of Exercise per Session: 0 min  Stress: Not on file  Social Connections: Not on file     Family History: The patient's family history includes Diabetes in her father and mother. There is no history of Heart disease.  ROS:   Please see the history of  present illness.     All other systems reviewed and are negative.   Risk Assessment/Calculations:           Physical Exam:    VS:  There were no vitals taken for this visit.    Wt Readings from Last 3 Encounters:  04/19/22 (!) 369 lb 9.6 oz (167.6 kg)  02/22/22 (!) 377 lb (171 kg)  02/13/22 (!) 377 lb 6.4 oz (171.2 kg)     GEN:  Well nourished, well developed in no acute distress HEENT: Normal NECK: No JVD; No carotid  bruits LYMPHATICS: No lymphadenopathy CARDIAC: RRR, no murmurs, rubs, gallops generalized bilateral lower extremity edema left greater than right RESPIRATORY:  Clear to auscultation without rales, wheezing or rhonchi  ABDOMEN: Soft, non-tender, non-distended MUSCULOSKELETAL:  No edema; No deformity  SKIN: Warm and dry NEUROLOGIC:  Alert and oriented x 3 PSYCHIATRIC:  Normal affect    EKGs/Labs/Other Studies Reviewed:    The following studies were reviewed today:  Echocardiogram 03/28/2020 IMPRESSIONS     1. Overall poor image quality even with definity Septal and apical  hypokinesis . Left ventricular ejection fraction, by estimation, is 50 to  55%. The left ventricle has low normal function. The left ventricle has no  regional wall motion abnormalities.  Left ventricular diastolic parameters were normal.   2. Right ventricular systolic function is normal. The right ventricular  size is normal.   3. Left atrial size was mildly dilated.   4. The mitral valve is normal in structure. No evidence of mitral valve  regurgitation. No evidence of mitral stenosis.   5. The aortic valve was not well visualized. Aortic valve regurgitation  is not visualized. No aortic stenosis is present.   6. The inferior vena cava is normal in size with greater than 50%  respiratory variability, suggesting right atrial pressure of 3 mmHg.   Echocardiogram 11/10/2020  IMPRESSIONS     1. Left ventricular ejection fraction, by estimation, is 60 to 65%. The  left ventricle  has normal function. The left ventricle has no regional  wall motion abnormalities. The left ventricular internal cavity size was  mildly dilated. There is mild left  ventricular hypertrophy. Left ventricular diastolic parameters were  normal.   2. Right ventricular systolic function is normal. The right ventricular  size is normal.   3. The mitral valve is normal in structure. Trivial mitral valve  regurgitation. No evidence of mitral stenosis.   4. The aortic valve is normal in structure. Aortic valve regurgitation is  not visualized. No aortic stenosis is present.   5. Aortic dilatation noted. There is borderline dilatation of the  ascending aorta, measuring 39 mm.   6. The inferior vena cava is normal in size with greater than 50%  respiratory variability, suggesting right atrial pressure of 3 mmHg.   Comparison(s): EF 50 %, hypokinesis.  Cardiac catheterization 03/28/2020 Dist RCA lesion is 25% stenosed. Mid LAD-1 lesion is 95% stenosed. A drug-eluting stent was successfully placed using a STENT RESOLUTE ONYX [id number]. The proximal stent was postdilated with a 3.75 mm  baloon. Post intervention, there is a 0% residual stenosis. Mid LAD-2 lesion is 50% stenosed. LV end diastolic pressure is moderately elevated. LVEDP 27 mm Hg. There is no aortic valve stenosis.   Continue dual antiplatelet therapy for 12 months. Consider clopidogrel monotherapy after 12 months, along with aggressive secondary prevention.     Increase statin to high potency.     Continue with plans for diuresis.  D/w Dr. Jens Somrenshaw.  Diagnostic Dominance: Right     Intervention          EKG: None today.  Recent Labs: 07/21/2021: Hemoglobin 10.2; Platelets 207 07/22/2021: B Natriuretic Peptide 50.6 11/15/2021: Pro B Natriuretic peptide (BNP) 13.0 02/13/2022: ALT 27; BUN 28; Creatinine, Ser 1.04; Potassium 3.5; Sodium 141; TSH 2.62  Recent Lipid Panel    Component Value Date/Time   CHOL 121 05/31/2021  1220   TRIG 175.0 (H) 05/31/2021 1220   HDL 38.60 (L) 05/31/2021 1220   CHOLHDL 3 05/31/2021 1220   VLDL  35.0 05/31/2021 1220   LDLCALC 48 05/31/2021 1220    ASSESSMENT & PLAN   Chronic diastolic CHF-weight today 368 pounds.  Generalized bilateral lower extremity edema.   Continues to have dyspnea with increased physical activity.   Continue spironolactone, potassium, furosemide, metoprolol Heart healthy low-sodium diet-salty 6 given Increase physical activity as tolerated Daily weights-contact office with any increase of 3 pounds overnight or 5 pounds in 1 week-reviewed Continue lower extremity support stockings Elevate lower extremities when not active.  Fatigue/DOE-stable.  Continues with 2 L of oxygen saturating in the high to mid 90s and 2 and half liters at home.  Patient has been unable to wean oxygen. Continue 2 L of O2 Refer to pulmonology  Coronary artery disease-no chest pain today.  Continues with chronic right-sided chest discomfort.  Not felt to be related to cardiac issues.  Reassured that this was musculoskeletal in nature.    Cardiac catheterization 03/28/2020 which showed LAD lesions x2.  She received mid LAD PCI and DES x1.  Details above. Continue aspirin,  rosuvastatin, metoprolol Heart healthy low-sodium diet-salty 6 given Increase physical activity as tolerated May stop Plavix.   Essential hypertension-BP today 122/58. Well-controlled at home. Continue metoprolol, spironolactone Heart healthy low-sodium diet-salty 6 given Increase physical activity as tolerated    Hyperlipidemia-03/28/2020: Cholesterol 106; HDL 27; LDL Cholesterol 57; Triglycerides 110; VLDL 22 Continue rosuvastatin Heart healthy low-sodium high-fiber diet Increase physical activity as tolerated Repeat fasting lipids and LFTs   Type 2 diabetes- glucose 148 on 07/21/21 Continue Novolin, metformin Follows with PCP   Morbid obesity-weight today 368 pounds.  Continues to be limited her  mobility due to low back pain and lower extremity numbness. Continue weight loss Follows with PCP   Disposition: Follow-up with Dr. Jens Som in 6 months.        Medication Adjustments/Labs and Tests Ordered: Current medicines are reviewed at length with the patient today.  Concerns regarding medicines are outlined above.  No orders of the defined types were placed in this encounter.  No orders of the defined types were placed in this encounter.    There are no Patient Instructions on file for this visit.   Signed, Ronney Asters, NP  05/08/2022 1:49 PM      Notice: This dictation was prepared with Dragon dictation along with smaller phrase technology. Any transcriptional errors that result from this process are unintentional and may not be corrected upon review.  I spent 15 ***minutes examining this patient, reviewing medications, and using patient centered shared decision making involving her cardiac care.  Prior to her visit I spent greater than 20 minutes reviewing her past medical history,  medications, and prior cardiac tests.

## 2022-05-10 ENCOUNTER — Ambulatory Visit: Payer: Medicaid Other | Admitting: General Practice

## 2022-05-17 ENCOUNTER — Ambulatory Visit: Payer: Medicaid Other | Admitting: Endocrinology

## 2022-05-23 ENCOUNTER — Other Ambulatory Visit: Payer: Self-pay | Admitting: Endocrinology

## 2022-05-26 ENCOUNTER — Other Ambulatory Visit: Payer: Self-pay | Admitting: Endocrinology

## 2022-05-26 DIAGNOSIS — E1165 Type 2 diabetes mellitus with hyperglycemia: Secondary | ICD-10-CM

## 2022-05-28 ENCOUNTER — Other Ambulatory Visit: Payer: Self-pay

## 2022-05-28 DIAGNOSIS — E1165 Type 2 diabetes mellitus with hyperglycemia: Secondary | ICD-10-CM

## 2022-05-28 MED ORDER — HUMULIN 70/30 (70-30) 100 UNIT/ML ~~LOC~~ SUSP: SUBCUTANEOUS | 2 refills | Status: DC

## 2022-06-18 NOTE — Progress Notes (Signed)
Cardiology Office Note:    Date:  06/21/2022   ID:  [name], DOB [birth year], MRN [id number]  PCP:  Elizabeth Palau, FNP   [facility] Providers Cardiologist:  [name], MD      Referring MD: Elizabeth Palau, FNP   Follow-up evaluation of her acute on chronic diastolic CHF, and CAD.  History of Present Illness:    [name] is a 52 y.o. female with a hx of hypertension, GERD, depression, hypothyroidism, morbid obesity, uncontrolled type 2 diabetes, and chronic diastolic CHF.   She was admitted to hospital with chest discomfort and on 03/28/2020.  Her high-sensitivity troponins were elevated at 514 and climbed to over 2000.  Her EKG showed nonspecific changes.  She was diagnosed with NSTEMI and taken to the Cath Lab.  Cardiac catheterization showed 25% distal RCA, mid LAD 95%, mid LAD-to 50%, moderately elevated LVEDP at 27 mmHg.  She received PCI with DES x1 to her mid LAD.  Her echocardiogram at that time showed normal LVEF, septal/apical hypokinesis, normal diastolic function, no valvular abnormalities and normal right ventricular function.   She followed up with heart and vascular transition of care clinic 04/06/2020.  At that time she reported she felt achy since being discharged from the hospital.  She denied chest pain except with deep inhalation.  Her discomfort resolved with exhalation.  She reported overall feeling better.  Her medications were reviewed.  It was noted that her pillbox was not in order, containing days with missing doses of certain medications and multiple doses on other days.  She reported having very poor vision and having difficulty with her pill bottles.   She presented to the clinic 06/06/20 for follow-up evaluation stated she felt fairly well.  She did report that she had increased anxiety related to doctors visits and medical procedures.  She ha noticed occasional brief episodes of sharp chest pain.  She reported that the pain could be  reproduced with pressing over the area.  I educated her that this was related to muscle wall pain.  She expressed understanding.  She reported that she did have occasional brief episodes of palpitations.  She reported that these would happen a couple times a week.  She also reported that she had presented to the emergency department with complaints of lower extremity swelling.  She was instructed to take extra doses of her furosemide.  She reported that she followed a low-sodium diet and restricted her fluid.  She was breathing much better with 2 L of oxygen via nasal cannula.  We gave  her the [place] support stocking sheet, salty 6 diet sheet, have her increase her physical activity as tolerated, continue current medication regimen and order a BMP.   Follow-up was planned for 6 months with Dr. Jens Som.  She presented to the clinic 10/18/20 for follow-up evaluation stated she had noticed that she was more fatigued over the last several months.  She noticed that she continued to need her oxygen.  She reported that when she was asleep at night and her oxygen would fall off her saturation will drop into the 70s.   She was maintaining a heart healthy low-sodium diet.  She reported that she did have some lower extremity numbness and that she had a herniated lumbar disc.  She continued to have some right-sided/shoulder area discomfort as well as left-sided shoulder discomfort.  Appeared to be related to deconditioning and musculoskeletal pain related to the use of her walker.  Her weight had increased  about 10-11 pounds.  She denied  bleeding issues.  She performed a walking test without her oxygen and her oxygen saturation dropped to 87 after only ambulating 10-12 feet.  I ordered a sleep study, sent my note to her insurance company so that she may maintain her oxygen therapy, asked to her increase her physical activity, repeated an echocardiogram, and planned follow-up after her sleep study.  We reviewed the  importance of weight loss and increasing physical activity.  I recommended that she start doing chair type exercises multiple times per day.  She was seen in follow-up by Dr. Jens Som on 03/28/2021.  Her echocardiogram 10/22 showed normal LV function, mid left ventricular enlargement, mild left ventricular hypertrophy.  She reported multiple complaints.  She noted dyspnea on exertion.  She described bilateral lower extremity edema which was worse with her legs down.  She noted occasional pain in her right chest that was unlike her previous infarct pain.  She denied syncope.  Her Lasix was continued.  She was instructed to follow-up with pulmonology for her chronic dyspnea/respiratory failure and for possible sleep apnea.  Her rosuvastatin was increased to 40 mg daily with plan to repeat lipids and LFTs in 8 weeks.  She was seen in the emergency department on 07/22/2021.  She complained of shortness of breath and chest discomfort.  She was also noted to have increased lower extremity fluid.  She was wearing 2.5 L nasal cannula and denied fever and chills.  She reported a cough x5 days which was nonproductive.  Her chest x-ray showed no acute cardiopulmonary disease.  Her high-sensitivity troponins were low and flat.  Her BNP was 50.  She received IV Lasix in the emergency department and urinated around 2 L.  She reported that her breathing was significantly improved.  She was advised to continue her home furosemide.  She presented to the clinic 08/20/21 for follow-up evaluation stated she continued to use 2 L of oxygen while she was away from home and 2-1/2 L of oxygen at home.  She reported intermittent periods of shortness of breath with laying back.  We reviewed her most recent emergency department visit.  She expressed understanding.  She remained limited in her physical activity due to back pain.  She reported that she was trying to wean oxygen but was unable to titrate down.  We reviewed her echocardiogram  and her pumping function.  She continued to be compliant with fluid restriction.  We reviewed the importance of daily weights and contacting the office with a weight increase of 2 to 3 pounds overnight or 5 pounds in 1 week.  I refered her to pulmonology for evaluation of her lungs and recommendations for coming off oxygen/treatment.  I reviewed the importance of lower extremity support stockings, she has been tolerating lower extremity wraps and is unable to manage support stockings.  I planned follow-up in 6 months.  She was seen by Dr. Isaiah Serge 04/19/2022.  She has been diagnosed with chronic respiratory failure with hypoxia.  She was continued on 2 L of oxygen.  It was felt that her chronic hypoxic respiratory failure is due to obesity and CHF.  She underwent sleep study which showed severe OSA with moderate oxygen desaturation ration.  It was felt this was secondary to obesity hypoventilation syndrome.  BiPAP titration study was recommended.  She presents to the clinic today for follow-up evaluation and states she continues to see pulmonology and endocrinology.  She was recently given samples of Ozempic.  We discussed the medication today.  I recommended that she start the medication to help with her comorbidities and weight loss.  She is on 3 L nasal cannula today.  Her weight is up to 377 pounds.  She has bilateral lower extremity 1+ pitting edema.  Lungs are clear to auscultation.  She reports compliance with her medications.  I will refer to wound care for evaluation and recommend bilateral Unna boots.  I will order fasting lipids and LFTs and plan follow-up in 6 months.   Today she denies chest pain, increased shortness of breath, increased lower extremity edema, fatigue, palpitations, melena, hematuria, hemoptysis, diaphoresis, weakness, presyncope, syncope, orthopnea, and PND.    Past Medical History:  Diagnosis Date   CAD (coronary artery disease)    a. s/p NSTEMI, LHC 03/2020 dRCA 25%, m-LAD-1  95%-0% (DES), mLAD-2 50%, LVEDP moderately elevated, 27 mmHg.   Chronic diastolic heart failure (HCC)    a. LHC/echo 3/22 c/ elevated LVEDP, EF 50-55%, hypokinesis; b. Echo 10/22 EF 60-65%, mild LVH, borderline dilatation of ascending aorta, 39mm.   DDD (degenerative disc disease), lumbar    Diabetes mellitus without complication (HCC)    GERD (gastroesophageal reflux disease)    Hyperlipidemia    Hypertension    Hypothyroidism    Lumbar herniated disc    Major depressive disorder    Morbid obesity (HCC)     Past Surgical History:  Procedure Laterality Date   CORONARY STENT INTERVENTION N/A 03/28/2020   Procedure: CORONARY STENT INTERVENTION;  Surgeon: Corky Crafts, MD;  Location: [facility] INVASIVE CV LAB;  Service: Cardiovascular;  Laterality: N/A;   DILATION AND CURETTAGE OF UTERUS     INCISION AND DRAINAGE PERIRECTAL ABSCESS  06/21/2014   INCISION AND DRAINAGE PERIRECTAL ABSCESS Bilateral 06/21/2014   Procedure: IRRIGATION AND DEBRIDEMENT PERIRECTAL ABSCESS;  Surgeon: Abigail Miyamoto, MD;  Location: [facility] OR;  Service: General;  Laterality: Bilateral;   LEFT HEART CATH AND CORONARY ANGIOGRAPHY N/A 03/28/2020   Procedure: LEFT HEART CATH AND CORONARY ANGIOGRAPHY;  Surgeon: Corky Crafts, MD;  Location: [facility] INVASIVE CV LAB;  Service: Cardiovascular;  Laterality: N/A;    Current Medications: Current Meds  Medication Sig   acetaminophen (TYLENOL) 500 MG tablet Take 1,000 mg by mouth every 6 (six) hours as needed for moderate pain or headache.   aspirin EC 81 MG tablet Take 81 mg by mouth daily. Swallow whole.   B Complex-C (SUPER B COMPLEX PO) Take 1 tablet by mouth daily.   Continuous Glucose Sensor (FREESTYLE LIBRE 3 SENSOR) MISC APPLY ONE SENSOR ON UPPER ARM EVERY 14 DAYS FOR CONTINUOUS GLUCOSE MONITORING   DULoxetine (CYMBALTA) 30 MG capsule Take 30 mg by mouth daily.   empagliflozin (JARDIANCE) 25 MG TABS tablet TAKE 1 TABLET BY MOUTH ONCE DAILY BEFORE BREAKFAST   furosemide  (LASIX) 40 MG tablet Take 1 tablet (40 mg total) by mouth daily.   gabapentin (NEURONTIN) 300 MG capsule Take 2 capsules by mouth 3 (three) times daily.   insulin NPH-regular Human (HUMULIN 70/30) (70-30) 100 UNIT/ML injection 150 units with breakfast, and 125 units with supper   Insulin Syringes, Disposable, U-100 1 ML MISC Use to inject insulin daily   levothyroxine (SYNTHROID, LEVOTHROID) 50 MCG tablet Take 50 mcg by mouth daily.   metFORMIN (GLUCOPHAGE-XR) 500 MG 24 hr tablet TAKE 4 TABLETS BY MOUTH ONCE DAILY   metoprolol succinate (TOPROL-XL) 50 MG 24 hr tablet TAKE 1 TABLET BY MOUTH ONCE DAILY (TAKE  WITH  OR  IMMEDIATELY  FOLLOWING  A  MEAL)   nystatin cream (MYCOSTATIN) Apply 1 application  topically 2 (two) times daily. Abdominal folds   omeprazole (PRILOSEC) 40 MG capsule Take 40 mg by mouth 2 (two) times daily.   potassium chloride (KLOR-CON) 10 MEQ tablet Take 2 tablets (20 mEq total) by mouth daily.   rosuvastatin (CRESTOR) 40 MG tablet Take 40 mg by mouth daily.   sitaGLIPtin (JANUVIA) 25 MG tablet Take 25 mg by mouth daily.     Allergies:   Irbesartan, Clindamycin/lincomycin, Latex, and Ultram [tramadol]   Social History   Socioeconomic History   Marital status: Single    Spouse name: Not on file   Number of children: Not on file   Years of education: Not on file   Highest education level: Not on file  Occupational History   Not on file  Tobacco Use   Smoking status: Never    Passive exposure: Past   Smokeless tobacco: Never  Vaping Use   Vaping Use: Never used  Substance and Sexual Activity   Alcohol use: No   Drug use: No   Sexual activity: Not on file  Other Topics Concern   Not on file  Social History Narrative   Not on file   Social Determinants of Health   Financial Resource Strain: Medium Risk (03/29/2020)   Overall Financial Resource Strain (CARDIA)    Difficulty of Paying Living Expenses: Somewhat hard  Food Insecurity: No Food Insecurity  (03/29/2020)   Hunger Vital Sign    Worried About Running Out of Food in the Last Year: Never true    Ran Out of Food in the Last Year: Never true  Transportation Needs: No Transportation Needs (03/29/2020)   PRAPARE - Administrator, Civil Service (Medical): No    Lack of Transportation (Non-Medical): No  Physical Activity: Inactive (03/29/2020)   Exercise Vital Sign    Days of Exercise per Week: 0 days    Minutes of Exercise per Session: 0 min  Stress: Not on file  Social Connections: Not on file     Family History: The patient's family history includes Diabetes in her father and mother. There is no history of Heart disease.  ROS:   Please see the history of present illness.     All other systems reviewed and are negative.   Risk Assessment/Calculations:           Physical Exam:    VS:  BP 106/60   Pulse 68   Ht 5\' 6"  (1.676 m)   Wt (!) 377 lb (171 kg)   SpO2 96%   BMI 60.85 kg/m     Wt Readings from Last 3 Encounters:  06/21/22 (!) 377 lb (171 kg)  04/19/22 (!) 369 lb 9.6 oz (167.6 kg)  02/22/22 (!) 377 lb (171 kg)     GEN:  Well nourished, well developed in no acute distress HEENT: Normal NECK: No JVD; No carotid bruits LYMPHATICS: No lymphadenopathy CARDIAC: RRR, no murmurs, rubs, gallops bilateral lower extremity 1+ pitting edema RESPIRATORY:  Clear to auscultation without rales, wheezing or rhonchi  ABDOMEN: Soft, non-tender, non-distended MUSCULOSKELETAL:  No edema; No deformity  SKIN: Warm and dry.  Left lower extremity intact blisters.  No erythema NEUROLOGIC:  Alert and oriented x 3 PSYCHIATRIC:  Normal affect    EKGs/Labs/Other Studies Reviewed:    EKG 06/21/2022.  Shows normal sinus rhythm right bundle branch block left anterior fascicular block 68 bpm.  The following studies were reviewed  today:  Echocardiogram 03/28/2020 IMPRESSIONS     1. Overall poor image quality even with definity Septal and apical  hypokinesis . Left  ventricular ejection fraction, by estimation, is 50 to  55%. The left ventricle has low normal function. The left ventricle has no  regional wall motion abnormalities.  Left ventricular diastolic parameters were normal.   2. Right ventricular systolic function is normal. The right ventricular  size is normal.   3. Left atrial size was mildly dilated.   4. The mitral valve is normal in structure. No evidence of mitral valve  regurgitation. No evidence of mitral stenosis.   5. The aortic valve was not well visualized. Aortic valve regurgitation  is not visualized. No aortic stenosis is present.   6. The inferior vena cava is normal in size with greater than 50%  respiratory variability, suggesting right atrial pressure of 3 mmHg.   Echocardiogram 11/10/2020  IMPRESSIONS     1. Left ventricular ejection fraction, by estimation, is 60 to 65%. The  left ventricle has normal function. The left ventricle has no regional  wall motion abnormalities. The left ventricular internal cavity size was  mildly dilated. There is mild left  ventricular hypertrophy. Left ventricular diastolic parameters were  normal.   2. Right ventricular systolic function is normal. The right ventricular  size is normal.   3. The mitral valve is normal in structure. Trivial mitral valve  regurgitation. No evidence of mitral stenosis.   4. The aortic valve is normal in structure. Aortic valve regurgitation is  not visualized. No aortic stenosis is present.   5. Aortic dilatation noted. There is borderline dilatation of the  ascending aorta, measuring 39 mm.   6. The inferior vena cava is normal in size with greater than 50%  respiratory variability, suggesting right atrial pressure of 3 mmHg.   Comparison(s): EF 50 %, hypokinesis.  Cardiac catheterization 03/28/2020 Dist RCA lesion is 25% stenosed. Mid LAD-1 lesion is 95% stenosed. A drug-eluting stent was successfully placed using a STENT RESOLUTE Caitlin [id number].  The proximal stent was postdilated with a 3.75 mm [place] baloon. Post intervention, there is a 0% residual stenosis. Mid LAD-2 lesion is 50% stenosed. LV end diastolic pressure is moderately elevated. LVEDP 27 mm Hg. There is no aortic valve stenosis.   Continue dual antiplatelet therapy for 12 months. Consider clopidogrel monotherapy after 12 months, along with aggressive secondary prevention.     Increase statin to high potency.     Continue with plans for diuresis.  D/w Dr. Jens Som.  Diagnostic Dominance: Right     Intervention          EKG: None today.  Recent Labs: 07/21/2021: Hemoglobin 10.2; Platelets 207 07/22/2021: B Natriuretic Peptide 50.6 11/15/2021: Pro B Natriuretic peptide (BNP) 13.0 02/13/2022: ALT 27; BUN 28; Creatinine, Ser 1.04; Potassium 3.5; Sodium 141; TSH 2.62  Recent Lipid Panel    Component Value Date/Time   CHOL 121 05/31/2021 1220   TRIG 175.0 (H) 05/31/2021 1220   HDL 38.60 (L) 05/31/2021 1220   CHOLHDL 3 05/31/2021 1220   VLDL 35.0 05/31/2021 1220   LDLCALC 48 05/31/2021 1220    ASSESSMENT & PLAN   Chronic diastolic CHF-weight today 377 pounds.  1+ bilateral lower extremity pitting edema, left lower extremity blisters.   Continues with dyspnea with increased physical activity.  She was seen and evaluated by pulmonology who diagnosed chronic hypoxic respiratory failure.   Continue current medical therapy Low-sodium diet Continue to increase physical activity  as tolerated Daily weights-contact office with any increase of 3 pounds overnight or 5 pounds in 1 week-reviewed Continue lower extremity support stockings Ordered referral to wound care for evaluation-would recommend bilateral Unna boots.  Fatigue/DOE-unchanged.  Continues with 2 L of oxygen.  Secondary to chronic hypoxic respiratory failure and obesity. Continue 2 L of O2 Follows with pulmonology  Coronary artery disease-does note episode of nonexertional chest discomfort that are  atypical in nature.  Discomfort appears to be musculoskeletal in nature.    Underwent cardiac catheterization 03/28/2020 which showed LAD lesions x2.  She received mid LAD PCI and DES x1.  Details above. Continue aspirin,  rosuvastatin, metoprolol Heart healthy low-sodium diet   Essential hypertension-BP today 106/60. Continue metoprolol, spironolactone Maintain blood pressure log    Hyperlipidemia-LDL 48 on 05/31/21 Continue rosuvastatin Heart healthy low-sodium high-fiber diet Increase physical activity as tolerated Repeat fasting lipids and LFTs   Type 2 diabetes- glucose 82 on 02/13/2022. Continue Novolin, metformin Follows with PCP   Morbid obesity-weight today 377 pounds.  She is limited with her  mobility due to low back pain and lower extremity numbness. Continue weight loss Follows with PCP   Disposition: Follow-up with Dr. Jens Som in 6-9 months.        Medication Adjustments/Labs and Tests Ordered: Current medicines are reviewed at length with the patient today.  Concerns regarding medicines are outlined above.  No orders of the defined types were placed in this encounter.  No orders of the defined types were placed in this encounter.    Patient Instructions  Medication Instructions:    *If you need a refill on your cardiac medications before your next appointment, please call your pharmacy*   Lab Work:  If you have labs (blood work) drawn today and your tests are completely normal, you will receive your results only by: MyChart Message (if you have MyChart) OR A paper copy in the mail If you have any lab test that is abnormal or we need to change your treatment, we will call you to review the results.   Testing/Procedures: Not needed  Follow-Up: At [facility], you and your health needs are our priority.  As part of our continuing mission to provide you with exceptional heart care, we have created designated Provider Care Teams.  These Care Teams  include your primary Cardiologist (physician) and Advanced Practice Providers (APPs -  Physician Assistants and Nurse Practitioners) who all work together to provide you with the care you need, when you need it.     Your next appointment:   6 to 9 month(s)  The format for your next appointment:   In Person  Provider:   [name], MD   You have been referred to  Wound care office-  evaluate and treatment  blister on feet Other Instructions    Signed, Ronney Asters, NP  06/21/2022 12:11 PM      Notice: This dictation was prepared with Dragon dictation along with smaller phrase technology. Any transcriptional errors that result from this process are unintentional and may not be corrected upon review.  I spent 15 minutes examining this patient, reviewing medications, and using patient centered shared decision making involving her cardiac care.  Prior to her visit I spent greater than 20 minutes reviewing her past medical history,  medications, and prior cardiac tests.

## 2022-06-21 ENCOUNTER — Ambulatory Visit: Payer: Medicaid Other | Attending: Cardiology | Admitting: General Practice

## 2022-06-21 ENCOUNTER — Encounter: Payer: Self-pay | Admitting: General Practice

## 2022-06-21 ENCOUNTER — Ambulatory Visit: Payer: Medicaid Other | Admitting: Pulmonary Disease

## 2022-06-21 VITALS — BP 106/60 | HR 68 | Ht 66.0 in | Wt 377.0 lb

## 2022-06-21 DIAGNOSIS — I2510 Atherosclerotic heart disease of native coronary artery without angina pectoris: Secondary | ICD-10-CM

## 2022-06-21 DIAGNOSIS — R0609 Other forms of dyspnea: Secondary | ICD-10-CM | POA: Diagnosis not present

## 2022-06-21 DIAGNOSIS — E118 Type 2 diabetes mellitus with unspecified complications: Secondary | ICD-10-CM

## 2022-06-21 DIAGNOSIS — E785 Hyperlipidemia, unspecified: Secondary | ICD-10-CM

## 2022-06-21 DIAGNOSIS — Z7984 Long term (current) use of oral hypoglycemic drugs: Secondary | ICD-10-CM

## 2022-06-21 DIAGNOSIS — I5032 Chronic diastolic (congestive) heart failure: Secondary | ICD-10-CM | POA: Diagnosis not present

## 2022-06-21 DIAGNOSIS — L89899 Pressure ulcer of other site, unspecified stage: Secondary | ICD-10-CM

## 2022-06-21 DIAGNOSIS — I10 Essential (primary) hypertension: Secondary | ICD-10-CM

## 2022-06-21 DIAGNOSIS — R5383 Other fatigue: Secondary | ICD-10-CM

## 2022-06-21 NOTE — Patient Instructions (Addendum)
Medication Instructions:  No changes   Start taking Ozempic as prescribed   *If you need a refill on your cardiac medications before your next appointment, please call your pharmacy*   Lab Work: fasting   Lipid Liver panel  If you have labs (blood work) drawn today and your tests are completely normal, you will receive your results only by: MyChart Message (if you have MyChart) OR A paper copy in the mail If you have any lab test that is abnormal or we need to change your treatment, we will call you to review the results.   Testing/Procedures: Not needed  Follow-Up: At [facility], you and your health needs are our priority.  As part of our continuing mission to provide you with exceptional heart care, we have created designated Provider Care Teams.  These Care Teams include your primary Cardiologist (physician) and Advanced Practice Providers (APPs -  Physician Assistants and Nurse Practitioners) who all work together to provide you with the care you need, when you need it.     Your next appointment:   6 to 9 month(s)  The format for your next appointment:   In Person  Provider:   [name], MD   You have been referred to  Wound care office-  evaluate and treatment  blister on feet Other Instructions

## 2022-06-24 ENCOUNTER — Other Ambulatory Visit: Payer: Self-pay | Admitting: Endocrinology

## 2022-06-24 DIAGNOSIS — E1165 Type 2 diabetes mellitus with hyperglycemia: Secondary | ICD-10-CM

## 2022-06-27 ENCOUNTER — Encounter (HOSPITAL_BASED_OUTPATIENT_CLINIC_OR_DEPARTMENT_OTHER): Payer: Medicaid Other | Admitting: Pulmonary Disease

## 2022-06-28 ENCOUNTER — Ambulatory Visit (HOSPITAL_BASED_OUTPATIENT_CLINIC_OR_DEPARTMENT_OTHER): Payer: Medicaid Other | Admitting: Pulmonary Disease

## 2022-07-05 ENCOUNTER — Ambulatory Visit: Payer: Medicaid Other | Admitting: Pulmonary Disease

## 2022-07-06 ENCOUNTER — Other Ambulatory Visit: Payer: Self-pay | Admitting: Endocrinology

## 2022-07-19 ENCOUNTER — Ambulatory Visit: Payer: Medicaid Other | Admitting: Endocrinology

## 2022-07-22 ENCOUNTER — Encounter (HOSPITAL_BASED_OUTPATIENT_CLINIC_OR_DEPARTMENT_OTHER): Payer: Medicaid Other | Admitting: Internal Medicine

## 2022-07-24 ENCOUNTER — Other Ambulatory Visit: Payer: Self-pay

## 2022-07-24 DIAGNOSIS — E1165 Type 2 diabetes mellitus with hyperglycemia: Secondary | ICD-10-CM

## 2022-07-24 MED ORDER — EMPAGLIFLOZIN 25 MG PO TABS
25.0000 mg | ORAL_TABLET | Freq: Every day | ORAL | 1 refills | Status: DC
Start: 2022-07-24 — End: 2022-08-06

## 2022-07-28 ENCOUNTER — Ambulatory Visit (HOSPITAL_BASED_OUTPATIENT_CLINIC_OR_DEPARTMENT_OTHER): Payer: Medicaid Other | Admitting: Pulmonary Disease

## 2022-08-06 ENCOUNTER — Other Ambulatory Visit: Payer: Self-pay

## 2022-08-06 ENCOUNTER — Telehealth: Payer: Self-pay

## 2022-08-06 DIAGNOSIS — E1165 Type 2 diabetes mellitus with hyperglycemia: Secondary | ICD-10-CM

## 2022-08-06 MED ORDER — EMPAGLIFLOZIN 25 MG PO TABS
25.0000 mg | ORAL_TABLET | Freq: Every day | ORAL | 1 refills | Status: DC
Start: 2022-08-06 — End: 2022-10-17

## 2022-08-06 NOTE — Telephone Encounter (Signed)
Patient need PA for [facility]

## 2022-08-07 ENCOUNTER — Other Ambulatory Visit (HOSPITAL_COMMUNITY): Payer: Self-pay

## 2022-08-07 ENCOUNTER — Telehealth: Payer: Self-pay

## 2022-08-07 NOTE — Telephone Encounter (Signed)
Patient Advocate Encounter   Received notification from pt msgs that prior authorization is required for Jardiance  Submitted: 08/07/22 Key [id number]  Status is pending

## 2022-08-09 NOTE — Telephone Encounter (Signed)
Pharmacy Patient Advocate Encounter  Received notification from [facility] that Prior Authorization for [name] has been APPROVED from 08/07/2022 to 08/07/2023.Marland Kitchen

## 2022-08-13 ENCOUNTER — Encounter (HOSPITAL_BASED_OUTPATIENT_CLINIC_OR_DEPARTMENT_OTHER): Payer: Medicaid Other | Attending: Internal Medicine | Admitting: Internal Medicine

## 2022-08-13 ENCOUNTER — Other Ambulatory Visit: Payer: Self-pay | Admitting: General Practice

## 2022-08-13 DIAGNOSIS — Z9981 Dependence on supplemental oxygen: Secondary | ICD-10-CM | POA: Diagnosis not present

## 2022-08-13 DIAGNOSIS — Z794 Long term (current) use of insulin: Secondary | ICD-10-CM | POA: Diagnosis not present

## 2022-08-13 DIAGNOSIS — I2510 Atherosclerotic heart disease of native coronary artery without angina pectoris: Secondary | ICD-10-CM | POA: Diagnosis not present

## 2022-08-13 DIAGNOSIS — Z79899 Other long term (current) drug therapy: Secondary | ICD-10-CM | POA: Diagnosis not present

## 2022-08-13 DIAGNOSIS — E11628 Type 2 diabetes mellitus with other skin complications: Secondary | ICD-10-CM | POA: Insufficient documentation

## 2022-08-13 DIAGNOSIS — I890 Lymphedema, not elsewhere classified: Secondary | ICD-10-CM | POA: Insufficient documentation

## 2022-08-13 DIAGNOSIS — I10 Essential (primary) hypertension: Secondary | ICD-10-CM | POA: Diagnosis not present

## 2022-08-13 DIAGNOSIS — J9611 Chronic respiratory failure with hypoxia: Secondary | ICD-10-CM | POA: Diagnosis not present

## 2022-08-14 NOTE — Progress Notes (Signed)
JULIE-ANN, VANMAANEN ([id number]) [id number] Page 1 of 6 Visit Report for 08/13/2022 Chief Complaint Document Details Patient Name: Date of Service: [name] 08/13/2022 8:00 A M Medical Record Number: [id number] Patient Account Number: [id number] Date of Birth/Sex: Treating RN: 1970/10/03 (52 y.o. F) Primary Care Provider: Julien Girt Other Clinician: Referring Provider: Treating Provider/Extender: Gretta Cool Weeks in Treatment: 0 Information Obtained from: Patient Chief Complaint 08/13/2022; lymphedema Electronic Signature(s) Signed: 08/13/2022 4:51:26 PM By: Geralyn Corwin DO Entered By: Geralyn Corwin on 08/13/2022 09:35:08 -------------------------------------------------------------------------------- HPI Details Patient Name: Date of Service: [name], [name] 08/13/2022 8:00 A M Medical Record Number: [id number] Patient Account Number: [id number] Date of Birth/Sex: Treating RN: [birth year] (52 y.o. F) Primary Care Provider: Julien Girt Other Clinician: Referring Provider: Treating Provider/Extender: Gretta Cool Weeks in Treatment: 0 History of Present Illness HPI Description: 08/13/2022 [name] is a 52 year old female with a past medical history of insulin-dependent uncontrolled type 2 diabetes, lymphedema, chronic respiratory failure with hypoxia currently on oxygen the presents to the clinic for a 1 year history of worsening lymphedema to her lower extremities bilaterally. She was referred by cardiology on 5/24 for concern of blistering to her lower extremities. Patient states that she has waxing and waning of these blisters that often open and weep. She has not had any weeping or open wounds over the past month. She does not have compression stockings but states she cannot put these on. She does have a live-in boyfriend that can help however. She is on Lasix 60 mg daily.  She states it is hard to elevate her legs when in a recliner. She currently has no open wounds and denies signs of infection. Electronic Signature(s) Signed: 08/13/2022 4:51:26 PM By: Geralyn Corwin DO Entered By: Geralyn Corwin on 08/13/2022 09:37:45 -------------------------------------------------------------------------------- Physical Exam Details Patient Name: Date of Service: [name], [name] 08/13/2022 8:00 A M Medical Record Number: [id number] Patient Account Number: [id number] Date of Birth/Sex: Treating RN: 08/01/1970 (52 y.o. F) Primary Care Provider: Julien Girt Other Clinician: Orland Mustard ([id number]) [id number] Page 2 of 6 Referring Provider: Treating Provider/Extender: Gretta Cool Weeks in Treatment: 0 Constitutional respirations regular, non-labored and within target range for patient.. Cardiovascular 2+ dorsalis pedis/posterior tibialis pulses. Psychiatric pleasant and cooperative. Notes T the lower extremities bilaterally there is 2+ pitting edema to the knee. No open wounds. Lymphedema skin changes noted. o Electronic Signature(s) Signed: 08/13/2022 4:51:26 PM By: Geralyn Corwin DO Entered By: Geralyn Corwin on 08/13/2022 09:42:56 -------------------------------------------------------------------------------- Physician Orders Details Patient Name: Date of Service: [name], [name] 08/13/2022 8:00 A M Medical Record Number: [id number] Patient Account Number: [id number] Date of Birth/Sex: Treating RN: Sep 22, 1970 (52 y.o. [name] Primary Care Provider: Julien Girt Other Clinician: Referring Provider: Treating Provider/Extender: Gretta Cool Weeks in Treatment: 0 Verbal / Phone Orders: No Diagnosis Coding Follow-up Appointments ppointment in 2 weeks. - Cancel if no wounds. Return A Return appointment in 1 month. Bathing/ Shower/ Hygiene May  shower and wash wound with soap and water. Edema Control - Lymphedema / SCD / Other Bilateral Lower Extremities Elevate legs to the level of the heart or above for 30 minutes daily and/or when sitting for 3-4 times a day throughout the day. Exercise regularly Compression stocking or Garment 30-40 mm/Hg pressure to: - Juxtalites bilaterally once they come in. Notes [facility]erral [facility] Electronic Signature(s) Signed: 08/13/2022 4:51:26 PM By: Geralyn Corwin DO Entered By: Geralyn Corwin on  08/13/2022 09:43:01 -------------------------------------------------------------------------------- Problem List Details Patient Name: Date of Service: [name] 08/13/2022 8:00 A M Medical Record Number: [id number] Patient Account Number: [id number] Date of Birth/Sex: Treating RN: February 06, 1970 (52 y.o. F) Primary Care Provider: Julien Girt Other Clinician: Referring Provider: Treating Provider/Extender: [name], [name], [name] ([id number]) [id number] Page 3 of 6 Weeks in Treatment: 0 Active Problems ICD-10 Encounter Code Description Active Date MDM Diagnosis I89.0 Lymphedema, not elsewhere classified 08/13/2022 No Yes E11.628 Type 2 diabetes mellitus with other skin complications 08/13/2022 No Yes I25.10 Atherosclerotic heart disease of native coronary artery without angina pectoris 08/13/2022 No Yes J96.11 Chronic respiratory failure with hypoxia 08/13/2022 No Yes Inactive Problems Resolved Problems Electronic Signature(s) Signed: 08/13/2022 4:51:26 PM By: Geralyn Corwin DO Entered By: Geralyn Corwin on 08/13/2022 09:40:36 -------------------------------------------------------------------------------- Progress Note Details Patient Name: Date of Service: [name], [name] 08/13/2022 8:00 A M Medical Record Number: [id number] Patient Account Number: [id number] Date of Birth/Sex: Treating RN: 09-26-1970 (52 y.o.  F) Primary Care Provider: Julien Girt Other Clinician: Referring Provider: Treating Provider/Extender: Gretta Cool Weeks in Treatment: 0 Subjective Chief Complaint Information obtained from Patient 08/13/2022; lymphedema History of Present Illness (HPI) 08/13/2022 [name] is a 52 year old female with a past medical history of insulin-dependent uncontrolled type 2 diabetes, lymphedema, chronic respiratory failure with hypoxia currently on oxygen the presents to the clinic for a 1 year history of worsening lymphedema to her lower extremities bilaterally. She was referred by cardiology on 5/24 for concern of blistering to her lower extremities. Patient states that she has waxing and waning of these blisters that often open and weep. She has not had any weeping or open wounds over the past month. She does not have compression stockings but states she cannot put these on. She does have a live-in boyfriend that can help however. She is on Lasix 60 mg daily. She states it is hard to elevate her legs when in a recliner. She currently has no open wounds and denies signs of infection. Patient History Allergies irbesartan, clindamycin, latex, Ultram Family History Diabetes - Mother,Father. Social History Never smoker, Marital Status - Single, Alcohol Use - Never, Drug Use - No History, Caffeine Use - Never. Medical History [name], [name] ([id number]) [id number] Page 4 of 6 Cardiovascular Patient has history of Coronary Artery Disease, Hypertension Endocrine Patient has history of Type II Diabetes Hospitalization/Surgery History - left heart cath. - stent placement/cardiac. - [facility]. Medical A Surgical History Notes [name] Gastrointestinal GERD, Hyperlipidemia, Hypothyroidism Musculoskeletal DDD Psychiatric Depression Review of Systems (ROS) Respiratory Complains or has symptoms of Shortness of  Breath. Objective Constitutional respirations regular, non-labored and within target range for patient.. Vitals Time Taken: 8:05 AM, Height: 66 in, Weight: 377 lbs, BMI: 60.8, Temperature: 98.1 F, Pulse: 71 bpm, Respiratory Rate: 18 breaths/min, Blood Pressure: 103/69 mmHg. Cardiovascular 2+ dorsalis pedis/posterior tibialis pulses. Psychiatric pleasant and cooperative. General Notes: T the lower extremities bilaterally there is 2+ pitting edema to the knee. No open wounds. Lymphedema skin changes noted. o Integumentary (Hair, Skin) Wound #1 status is Healed - Epithelialized. Original cause of wound was Bump. The date acquired was: 05/29/2022. The wound is located on the Left Lower Leg. The wound measures 0cm length x 0cm width x 0cm depth; 0cm^2 area and 0cm^3 volume. There is no tunneling or undermining noted. There is a none present amount of drainage noted. The wound margin is distinct with the outline attached to the wound base. There is no granulation  within the wound bed. There is no necrotic tissue within the wound bed. Periwound temperature was noted as No Abnormality. Wound #2 status is Healed - Epithelialized. Original cause of wound was Bump. The date acquired was: 05/29/2022. The wound is located on the Right,Anterior Lower Leg. The wound measures 0cm length x 0cm width x 0cm depth; 0cm^2 area and 0cm^3 volume. There is no tunneling noted. There is a none present amount of drainage noted. The wound margin is distinct with the outline attached to the wound base. There is no granulation within the wound bed. There is no necrotic tissue within the wound bed. The periwound skin appearance had no abnormalities noted for texture. The periwound skin appearance had no abnormalities noted for color. The periwound skin appearance did not exhibit: Dry/Scaly, Maceration. Assessment Active Problems ICD-10 Lymphedema, not elsewhere classified Type 2 diabetes mellitus with other skin  complications Atherosclerotic heart disease of native coronary artery without angina pectoris Chronic respiratory failure with hypoxia Patient presents with a 1 year history of worsening lymphedema to her lower extremities bilaterally. She developed heart failure 2 years ago following an NSTEMI. She does not wear compression therapy. We discussed the importance of edema control to prevent future wounds and weeping. We will order her compression stockings and Velcro compression wraps. She is worried she will not be able to get these on. She does have a live-in boyfriend that can help at least put these on in the morning and take them off in the afternoon. Also recommended elevating legs when sitting for long periods of time however she states she cannot do this. For now there are no open wounds and I do not recommend wrapping her legs. She would benefit from a lymphedema clinic and we will refer her. I did offer her a 2-week follow-up and 4-week follow-up in our clinic in case she were to develop weeping. She can cancel these if needed. She knows to call with any questions or concerns. Plan Follow-up Appointments: Return Appointment in 2 weeks. - Cancel if no wounds. [name], [name] ([id number]) [id number] Page 5 of 6 Return appointment in 1 month. Bathing/ Shower/ Hygiene: May shower and wash wound with soap and water. Edema Control - Lymphedema / SCD / Other: Elevate legs to the level of the heart or above for 30 minutes daily and/or when sitting for 3-4 times a day throughout the day. Exercise regularly Compression stocking or Garment 30-40 mm/Hg pressure to: - Juxtalites bilaterally once they come in. General Notes: [facility]erral to [facility] 1. Order compression stockings/garments 2. Referral to lymphedema clinic 3. Follow-up in 2 weeks and 4 weeks Electronic Signature(s) Signed: 08/13/2022 4:51:26 PM By: Geralyn Corwin DO Entered By: Geralyn Corwin  on 08/13/2022 09:49:11 -------------------------------------------------------------------------------- HxROS Details Patient Name: Date of Service: [name], [name] 08/13/2022 8:00 A M Medical Record Number: [id number] Patient Account Number: [id number] Date of Birth/Sex: Treating RN: 1970/02/10 (52 y.o. [name] Primary Care Provider: Julien Girt Other Clinician: Referring Provider: Treating Provider/Extender: Gretta Cool Weeks in Treatment: 0 Respiratory Complaints and Symptoms: Positive for: Shortness of Breath Cardiovascular Medical History: Positive for: Coronary Artery Disease; Hypertension Gastrointestinal Medical History: Past Medical History Notes: GERD, Hyperlipidemia, Hypothyroidism Endocrine Medical History: Positive for: Type II Diabetes Musculoskeletal Medical History: Past Medical History Notes: DDD Psychiatric Medical History: Past Medical History Notes: Depression Immunizations Pneumococcal Vaccine: Received Pneumococcal Vaccination: No [name], [name] ([id number]) [id number] Page 6 of 6 Implantable Devices No devices added Hospitalization / Surgery History Type of  Hospitalization/Surgery left heart cath stent placement/cardiac [facility] Family and Social History Diabetes: Yes - Mother,Father; Never smoker; Marital Status - Single; Alcohol Use: Never; Drug Use: No History; Caffeine Use: Never; Financial Concerns: No; Food, Clothing or Shelter Needs: No; Support System Lacking: No; Transportation Concerns: No Electronic Signature(s) Signed: 08/13/2022 4:51:26 PM By: Geralyn Corwin DO Signed: 08/14/2022 7:59:22 AM By: Brenton Grills Entered By: Brenton Grills on 08/13/2022 08:11:08 -------------------------------------------------------------------------------- SuperBill Details Patient Name: Date of Service: [name], [name] 08/13/2022 Medical Record Number: [id number] Patient  Account Number: [id number] Date of Birth/Sex: Treating RN: Dec 11, 1970 (52 y.o. [name] Primary Care Provider: Julien Girt Other Clinician: Referring Provider: Treating Provider/Extender: Gretta Cool Weeks in Treatment: 0 Diagnosis Coding ICD-10 Codes Code Description I89.0 Lymphedema, not elsewhere classified E11.628 Type 2 diabetes mellitus with other skin complications I25.10 Atherosclerotic heart disease of native coronary artery without angina pectoris J96.11 Chronic respiratory failure with hypoxia Facility Procedures : CPT4 Code: [id number] Description: WOUND CARE VISIT-LEV 3 NEW PT Modifier: Quantity: 1 Physician Procedures : CPT4 Code Description Modifier [id number] - WC PHYS LEVEL 3 - EST PT ICD-10 Diagnosis Description I89.0 Lymphedema, not elsewhere classified E11.628 Type 2 diabetes mellitus with other skin complications I25.10 Atherosclerotic heart disease of  native coronary artery without angina pectoris J96.11 Chronic respiratory failure with hypoxia Quantity: 1 Electronic Signature(s) Signed: 08/13/2022 4:51:26 PM By: Geralyn Corwin DO Entered By: Geralyn Corwin on 08/13/2022 09:49:26

## 2022-08-14 NOTE — Progress Notes (Signed)
[name] ([id number]) [id number] Page 1 of 4 Visit Report for 08/13/2022 Abuse Risk Screen Details Patient Name: Date of Service: [name] 08/13/2022 8:00 A M Medical Record Number: [id number] Patient Account Number: [id number] Date of Birth/Sex: Treating RN: 01-03-1971 (52 y.o. [name] Primary Care [name]: Julien Girt Other Clinician: Referring [name]: Treating [name]/Extender: Gretta Cool Weeks in Treatment: 0 Abuse Risk Screen Items Answer ABUSE RISK SCREEN: Has anyone close to you tried to hurt or harm you recentlyo No Do you feel uncomfortable with anyone in your familyo No Has anyone forced you do things that you didnt want to doo No Electronic Signature(s) Signed: 08/14/2022 7:59:22 AM By: Brenton Grills Entered By: Brenton Grills on 08/13/2022 08:11:44 -------------------------------------------------------------------------------- Activities of Daily Living Details Patient Name: Date of Service: [name] 08/13/2022 8:00 A M Medical Record Number: [id number] Patient Account Number: [id number] Date of Birth/Sex: Treating RN: [birth year] (52 y.o. [name] Primary Care [name]: Julien Girt Other Clinician: Referring [name]: Treating [name]/Extender: Gretta Cool Weeks in Treatment: 0 Activities of Daily Living Items Answer Activities of Daily Living (Please select one for each item) Drive Automobile Not Able T Medications ake Completely Able Use T elephone Need Assistance Care for Appearance Completely Able Use T oilet Need Assistance Bath / Shower Need Assistance Dress Self Need Assistance Feed Self Need Assistance Walk Need Assistance Get In / Out Bed Need Assistance Housework Need Assistance Prepare Meals Need Assistance Handle Money Need Assistance Shop for Self Need Assistance Electronic Signature(s) Signed:  08/14/2022 7:59:22 AM By: Brenton Grills Entered By: Brenton Grills on 08/13/2022 08:12:37 [name], Aggie [name] ([id number]) [id number] Page 2 of 4 -------------------------------------------------------------------------------- Education Screening Details Patient Name: Date of Service: [name] 08/13/2022 8:00 A M Medical Record Number: [id number] Patient Account Number: [id number] Date of Birth/Sex: Treating RN: 1970-09-22 (52 y.o. [name] Primary Care [name]: Julien Girt Other Clinician: Referring [name]: Treating [name]/Extender: [name] in Treatment: 0 Learning Preferences/Education Level/Primary Language Preferred Language: English Cognitive Barrier Language Barrier: No Translator Needed: No Memory Deficit: No Emotional Barrier: No Cultural/Religious Beliefs Affecting Medical Care: No Physical Barrier Impaired Vision: Yes Glasses, can't wear right now Impaired Hearing: No Decreased Hand dexterity: No Knowledge/Comprehension Knowledge Level: High Comprehension Level: High Ability to understand written instructions: High Ability to understand verbal instructions: High Motivation Anxiety Level: Calm Cooperation: Cooperative Education Importance: Acknowledges Need Interest in Health Problems: Asks Questions Perception: Coherent Willingness to Engage in Self-Management High Activities: Readiness to Engage in Self-Management High Activities: Electronic Signature(s) Signed: 08/14/2022 7:59:22 AM By: Brenton Grills Entered By: Brenton Grills on 08/13/2022 08:13:30 -------------------------------------------------------------------------------- Fall Risk Assessment Details Patient Name: Date of Service: [name] 08/13/2022 8:00 A M Medical Record Number: [id number] Patient Account Number: [id number] Date of Birth/Sex: Treating RN: 11-06-1970 (52 y.o. [name] Primary Care Abdulkareem Badolato: Julien Girt Other Clinician: Referring Mozell Hardacre: Treating Caralyn Twining/Extender: Gretta Cool Weeks in Treatment: 0 Fall Risk Assessment Items Have you had 2 or more falls in the last 12 monthso 0 No Have you had any fall that resulted in injury in the last 12 monthso 0 No FALLS RISK SCREEN History of falling - immediate or within 3 months 0 No [name], Caitlin ([id number]) [phone] [id number] Page 3 of 4 Secondary diagnosis (Do you have 2 or more medical diagnoseso) 0 No Ambulatory aid None/bed rest/wheelchair/nurse 0 No Crutches/cane/walker 0 No Furniture 0 No Intravenous therapy  Access/Saline/Heparin Lock 0 No Gait/Transferring Normal/ bed rest/ wheelchair 0 No Weak (short steps with or without shuffle, stooped but able to lift head while walking, may seek 0 No support from furniture) Impaired (short steps with shuffle, may have difficulty arising from chair, head down, impaired 0 No balance) Mental Status Oriented to own ability 0 No Electronic Signature(s) Signed: 08/14/2022 7:59:22 AM By: Brenton Grills Entered By: Brenton Grills on 08/13/2022 08:13:41 -------------------------------------------------------------------------------- Foot Assessment Details Patient Name: Date of Service: [name] 08/13/2022 8:00 A M Medical Record Number: [id number] Patient Account Number: [id number] Date of Birth/Sex: Treating RN: 15-Aug-1970 (52 y.o. [name] Primary Care Mykai Wendorf: Julien Girt Other Clinician: Referring Rubena Roseman: Treating Koji Niehoff/Extender: Gretta Cool Weeks in Treatment: 0 Foot Assessment Items Site Locations + = Sensation present, - = Sensation absent, C = Callus, U = Ulcer R = Redness, W = Warmth, M = Maceration, PU = Pre-ulcerative lesion F = Fissure, S = Swelling, D = Dryness Assessment Right: Left: Other Deformity: No No Prior Foot  Ulcer: No No Prior Amputation: No No Charcot Joint: No No Ambulatory Status: Non-ambulatory Assistance Device: Wheelchair GaitDaisie [name], Aggie [name] ([id number]) [id number] Page 4 of 4 Electronic Signature(s) Signed: 08/14/2022 7:59:22 AM By: Brenton Grills Entered By: Brenton Grills on 08/13/2022 08:17:43 -------------------------------------------------------------------------------- Nutrition Risk Screening Details Patient Name: Date of Service: [name] 08/13/2022 8:00 A M Medical Record Number: [id number] Patient Account Number: [id number] Date of Birth/Sex: Treating RN: April 21, 1970 (52 y.o. [name] Primary Care Kathlyne Loud: Julien Girt Other Clinician: Referring Jhovani Griswold: Treating Tatia Petrucci/Extender: Gretta Cool Weeks in Treatment: 0 Height (in): 66 Weight (lbs): 377 Body Mass Index (BMI): 60.8 Nutrition Risk Screening Items Score Screening NUTRITION RISK SCREEN: I have an illness or condition that made me change the kind and/or amount of food I eat 0 No I eat fewer than two meals per day 0 No I eat few fruits and vegetables, or milk products 0 No I have three or more drinks of beer, liquor or wine almost every day 0 No I have tooth or mouth problems that make it hard for me to eat 0 No I don't always have enough money to buy the food I need 0 No I eat alone most of the time 0 No I take three or more different prescribed or over-the-counter drugs a day 0 No Without wanting to, I have lost or gained 10 pounds in the last six months 0 No I am not always physically able to shop, cook and/or feed myself 0 No Nutrition Protocols Good Risk Protocol Moderate Risk Protocol High Risk Proctocol Risk Level: Good Risk Score: 0 Electronic Signature(s) Signed: 08/14/2022 7:59:22 AM By: Brenton Grills Entered By: Brenton Grills on 08/13/2022 08:13:46

## 2022-08-14 NOTE — Progress Notes (Addendum)
[name], [name] ([id number]) [id number] Page 1 of 9 Visit Report for 08/13/2022 Allergy List Details Patient Name: Date of Service: [name] 08/13/2022 8:00 A M Medical Record Number: [id number] Patient Account Number: [id number] Date of Birth/Sex: Treating RN: 12-Feb-1970 (52 y.o. [name] Primary Care Manoah Deckard: Julien Girt Other Clinician: Referring Danika Kluender: Treating Ari Bernabei/Extender: Gretta Cool Weeks in Treatment: 0 Allergies Active Allergies irbesartan clindamycin latex Ultram Allergy Notes Electronic Signature(s) Signed: 08/14/2022 7:59:22 AM By: Brenton Grills Entered By: Brenton Grills on 08/13/2022 08:08:56 -------------------------------------------------------------------------------- Arrival Information Details Patient Name: Date of Service: [name], [name] 08/13/2022 8:00 A M Medical Record Number: [id number] Patient Account Number: [id number] Date of Birth/Sex: Treating RN: 1970-11-29 (52 y.o. [name] Primary Care Bentlee Benningfield: Julien Girt Other Clinician: Referring Evren Shankland: Treating Ruby Logiudice/Extender: Lowry Ram in Treatment: 0 Visit Information Patient Arrived: Wheel Chair Arrival Time: 08:04 Accompanied By: self Transfer Assistance: None Patient Identification Verified: Yes Secondary Verification Process Completed: Yes Patient Requires Transmission-Based Precautions: No Patient Has Alerts: No Electronic Signature(s) Signed: 08/14/2022 7:59:22 AM By: Brenton Grills Entered By: Brenton Grills on 08/13/2022 08:05:08 [name], [name] ([id number]) [id number] Page 2 of 9 -------------------------------------------------------------------------------- Clinic Level of Care Assessment Details Patient Name: Date of Service: [name] 08/13/2022 8:00 A M Medical Record Number: [id number] Patient Account Number:  [id number] Date of Birth/Sex: Treating RN: January 01, 1971 (52 y.o. [name] Primary Care Shere Eisenhart: Julien Girt Other Clinician: Referring Zoeya Gramajo: Treating Daniel Johndrow/Extender: Gretta Cool Weeks in Treatment: 0 Clinic Level of Care Assessment Items TOOL 1 Quantity Score X- 1 0 Use when EandM and Procedure is performed on INITIAL visit ASSESSMENTS - Nursing Assessment / Reassessment X- 1 20 General Physical Exam (combine w/ comprehensive assessment (listed just below) when performed on new pt. evals) X- 1 25 Comprehensive Assessment (HX, ROS, Risk Assessments, Wounds Hx, etc.) ASSESSMENTS - Wound and Skin Assessment / Reassessment X- 1 10 Dermatologic / Skin Assessment (not related to wound area) ASSESSMENTS - Ostomy and/or Continence Assessment and Care []  - 0 Incontinence Assessment and Management []  - 0 Ostomy Care Assessment and Management (repouching, etc.) PROCESS - Coordination of Care X - Simple Patient / Family Education for ongoing care 1 15 []  - 0 Complex (extensive) Patient / Family Education for ongoing care X- 1 10 Staff obtains Chiropractor, Records, T Results / Process Orders est []  - 0 Staff telephones HHA, Nursing Homes / Clarify orders / etc []  - 0 Routine Transfer to another Facility (non-emergent condition) []  - 0 Routine Hospital Admission (non-emergent condition) X- 1 15 New Admissions / Manufacturing engineer / Ordering NPWT Apligraf, etc. , []  - 0 Emergency Hospital Admission (emergent condition) PROCESS - Special Needs []  - 0 Pediatric / Minor Patient Management []  - 0 Isolation Patient Management []  - 0 Hearing / Language / Visual special needs []  - 0 Assessment of Community assistance (transportation, D/C planning, etc.) []  - 0 Additional assistance / Altered mentation []  - 0 Support Surface(s) Assessment (bed, cushion, seat, etc.) INTERVENTIONS - Miscellaneous []  - 0 External ear exam []  - 0 Patient  Transfer (multiple staff / Nurse, adult / Similar devices) []  - 0 Simple Staple / Suture removal (25 or less) []  - 0 Complex Staple / Suture removal (26 or more) []  - 0 Hypo/Hyperglycemic Management (do not check if billed separately) X- 1 15 Ankle / Brachial Index (ABI) - do not check if billed separately Has the patient been seen at the  hospital within the last three years: Yes Total Score: 110 Level Of Care: New/Established - Level 3 Electronic Signature(s) Signed: 08/14/2022 7:59:22 AM By: Othelia Pulling, Meighan [phone] By: Brenton Grills [phone].pdf Page 3 of 9 Signed: 08/14/2022 7:59:22 Entered By: Brenton Grills on 08/13/2022 09:08:07 -------------------------------------------------------------------------------- Encounter Discharge Information Details Patient Name: Date of Service: [name] 08/13/2022 8:00 A M Medical Record Number: [id number] Patient Account Number: [id number] Date of Birth/Sex: Treating RN: May 31, 1970 (52 y.o. [name] Primary Care Kamyra Schroeck: Julien Girt Other Clinician: Referring Kendal Raffo: Treating Azayla Polo/Extender: Gretta Cool Weeks in Treatment: 0 Encounter Discharge Information Items Discharge Condition: Stable Ambulatory Status: Walker Discharge Destination: Home Transportation: Private Auto Accompanied By: boyfriend Schedule Follow-up Appointment: Yes Clinical Summary of Care: Patient Declined Electronic Signature(s) Signed: 08/14/2022 7:59:22 AM By: Brenton Grills Entered By: Brenton Grills on 08/13/2022 09:19:25 -------------------------------------------------------------------------------- Lower Extremity Assessment Details Patient Name: Date of Service: [name], [name] 08/13/2022 8:00 A M Medical Record Number: [id number] Patient Account Number: [id number] Date of Birth/Sex: Treating RN: 09/24/1970 (52 y.o. [name] Primary Care Rhonda Vangieson:  Julien Girt Other Clinician: Referring Lowery Paullin: Treating Henrietta Cieslewicz/Extender: Gretta Cool Weeks in Treatment: 0 Edema Assessment Assessed: [Left: No] [Right: No] Edema: [Left: Yes] [Right: Yes] Calf Left: Right: Point of Measurement: From Medial Instep 30 cm 30 cm Ankle Left: Right: Point of Measurement: From Medial Instep 48.5 cm 60 cm Knee To Floor Left: Right: From Medial Instep 41 cm 40 cm Vascular Assessment Pulses: Dorsalis Pedis Palpable: [Left:Yes] [Right:Yes] Extremity colors, hair growth, and conditions: Carreto, Mariyam ([id number]) [Right:[id number] Page 4 of 9] Extremity Color: [Left:Normal] [Right:Normal] Hair Growth on Extremity: [Left:No] [Right:No] Temperature of Extremity: [Left:Warm] [Right:Warm] Capillary Refill: [Left:< 3 seconds] [Right:< 3 seconds] Dependent Rubor: [Left:No] [Right:No] Blanched when Elevated: [Right:No] Lipodermatosclerosis: [Left:No] [Right:No] Blood Pressure: Brachial: [Left:103] [Right:103] Ankle: [Left:Dorsalis Pedis: 90 0.87] [Right:Dorsalis Pedis: 76 0.74] Toe Nail Assessment Left: Right: Thick: No No Discolored: No No Deformed: No No Improper Length and Hygiene: No No Electronic Signature(s) Signed: 08/14/2022 7:59:22 AM By: Brenton Grills Entered By: Brenton Grills on 08/13/2022 08:40:44 -------------------------------------------------------------------------------- Multi Wound Chart Details Patient Name: Date of Service: [name], [name] 08/13/2022 8:00 A M Medical Record Number: [id number] Patient Account Number: [id number] Date of Birth/Sex: Treating RN: March 23, 1970 (52 y.o. F) Primary Care Calin Ellery: Julien Girt Other Clinician: Referring Sarinah Doetsch: Treating Lycan Davee/Extender: Gretta Cool Weeks in Treatment: 0 Vital Signs Height(in): 66 Pulse(bpm): 71 Weight(lbs): 377 Blood Pressure(mmHg): 103/69 Body Mass Index(BMI):  60.8 Temperature(F): 98.1 Respiratory Rate(breaths/min): 18 [1:Photos: No Photos Left Lower Leg Wound Location: Bump Wounding Event: Lymphedema Primary Etiology: Coronary Artery Disease, Comorbid History: Hypertension, Type II Diabetes 05/29/2022 Date Acquired: 0 Weeks of Treatment: Healed - Epithelialized Wound Status: No  Wound Recurrence: 0x0x0 Measurements [name] x W x D (cm) 0 A (cm) : rea 0 Volume (cm) : Partial Thickness Classification: None Present Exudate A mount: Distinct, outline attached Wound Margin: None Present (0%) Granulation A mount: None Present (0%) Necrotic  A mount: Fascia: No Exposed Structures: Fat Layer (Subcutaneous Tissue): No Tendon: No Muscle: No Joint: No Bone: No N/A Epithelialization: Periwound Skin Texture: Periwound Skin Moisture:] [2:No Photos Right, Anterior Lower Leg Bump Lymphedema Coronary  Artery Disease, Hypertension, Type II Diabetes 05/29/2022 0 Healed - Epithelialized No 0x0x0 0 0 Partial Thickness None Present Distinct, outline attached None Present (0%) None Present (0%) Fascia: No Fat Layer (Subcutaneous Tissue): No Tendon: No Muscle:  No Joint: No Bone: No  None No Abnormalities Noted Maceration: No Dry/Scaly: No] [N/A:N/A N/A N/A N/A N/A N/A N/A N/A N/A N/A N/A N/A N/A N/A N/A N/A N/A N/A N/A N/A N/A] [name], [name] ([id number]) [1:Periwound Skin Color: No Abnormality Temperature:] [2:No Abnormalities Noted N/A] [N/A:[id number] Page 5 of 9 N/A N/A] Treatment Notes Wound #1 (Lower Leg) Wound Laterality: Left Cleanser Peri-Wound Care Topical Primary Dressing Secondary Dressing Secured With Compression Wrap Compression Stockings Add-Ons Wound #2 (Lower Leg) Wound Laterality: Right, Anterior Cleanser Peri-Wound Care Topical Primary Dressing Secondary Dressing Secured With Compression Wrap Compression Stockings Add-Ons Electronic Signature(s) Signed: 08/13/2022 4:51:26 PM By: Geralyn Corwin DO Entered By: Geralyn Corwin  on 08/13/2022 09:40:41 -------------------------------------------------------------------------------- Multi-Disciplinary Care Plan Details Patient Name: Date of Service: [name], [name] 08/13/2022 8:00 A M Medical Record Number: [id number] Patient Account Number: [id number] Date of Birth/Sex: Treating RN: [birth year] (52 y.o. [name] Primary Care Keilin Gamboa: Julien Girt Other Clinician: Referring Ople Girgis: Treating Amarah Brossman/Extender: Gretta Cool Weeks in Treatment: 0 Active Inactive Electronic Signature(s) Signed: 09/23/2022 12:47:54 PM By: Shawn Stall RN, BSN Signed: 09/24/2022 2:05:12 PM By: Brenton Grills Previous Signature: 08/14/2022 7:59:22 AM Version By: Brenton Grills Entered By: Shawn Stall on 09/23/2022 12:47:54 [name], [name] ([id number]) [id number] Page 6 of 9 -------------------------------------------------------------------------------- Pain Assessment Details Patient Name: Date of Service: [name] 08/13/2022 8:00 A M Medical Record Number: [id number] Patient Account Number: [id number] Date of Birth/Sex: Treating RN: [birth year] (52 y.o. [name] Primary Care Paddy Neis: Julien Girt Other Clinician: Referring Derrich Gaby: Treating Danniella Robben/Extender: Gretta Cool Weeks in Treatment: 0 Active Problems Location of Pain Severity and Description of Pain Patient Has Paino No Site Locations Pain Management and Medication Current Pain Management: Electronic Signature(s) Signed: 08/14/2022 7:59:22 AM By: Brenton Grills Entered By: Brenton Grills on 08/13/2022 08:41:33 -------------------------------------------------------------------------------- Patient/Caregiver Education Details Patient Name: Date of Service: [name], Caitlin Elbert Ewings 7/16/2024andnbsp8:00 A M Medical Record Number: [id number] Patient Account Number: [id number] Date of Birth/Gender: Treating  RN: 06-Aug-1970 (52 y.o. [name] Primary Care Physician: Julien Girt Other Clinician: Referring Physician: Treating Physician/Extender: Lowry Ram in Treatment: 0 Education Assessment Education Provided To: Patient and Caregiver Education Topics Provided Wound/Skin Impairment: Methods: Explain/Verbal Responses: State content correctly [name], [name] ([id number]) [id number] Page 7 of 9 Electronic Signature(s) Signed: 08/14/2022 7:59:22 AM By: Brenton Grills Entered By: Brenton Grills on 08/13/2022 08:42:38 -------------------------------------------------------------------------------- Wound Assessment Details Patient Name: Date of Service: [name], [name] 08/13/2022 8:00 A M Medical Record Number: [id number] Patient Account Number: [id number] Date of Birth/Sex: Treating RN: [birth year] (52 y.o. [name] Primary Care Tashari Schoenfelder: Julien Girt Other Clinician: Referring Sheenah Dimitroff: Treating Deonta Bomberger/Extender: Gretta Cool Weeks in Treatment: 0 Wound Status Wound Number: 1 Primary Etiology: Lymphedema Wound Location: Left Lower Leg Wound Status: Healed - Epithelialized Wounding Event: Bump Comorbid History: Coronary Artery Disease, Hypertension, Type II Diabetes Date Acquired: 05/29/2022 Weeks Of Treatment: 0 Clustered Wound: No Wound Measurements Length: (cm) Width: (cm) Depth: (cm) Area: (cm) Volume: (cm) 0 % Reduction in Area: 0 % Reduction in Volume: 0 Tunneling: No 0 Undermining: No 0 Wound Description Classification: Partial Thickness Wound Margin: Distinct, outline attached Exudate Amount: None Present Foul Odor After Cleansing: No Slough/Fibrino No Wound Bed Granulation Amount: None Present (0%) Exposed Structure Necrotic Amount: None Present (0%) Fascia Exposed: No Fat Layer (Subcutaneous Tissue) Exposed: No Tendon Exposed: No Muscle Exposed:  No Joint Exposed: No Bone Exposed: No Periwound Skin Texture Texture Color No Abnormalities Noted: No  No Abnormalities Noted: No Moisture Temperature / Pain No Abnormalities Noted: No Temperature: No Abnormality Treatment Notes Wound #1 (Lower Leg) Wound Laterality: Left Cleanser Peri-Wound Care Topical Primary Dressing Secondary Dressing Secured With Compression [name], [name] ([id number]) [id number] Page 8 of 9 Compression Stockings Add-Ons Electronic Signature(s) Signed: 08/14/2022 7:59:22 AM By: Brenton Grills Entered By: Brenton Grills on 08/13/2022 09:10:27 -------------------------------------------------------------------------------- Wound Assessment Details Patient Name: Date of Service: [name], [name] 08/13/2022 8:00 A M Medical Record Number: [id number] Patient Account Number: [id number] Date of Birth/Sex: Treating RN: 1970-10-26 (52 y.o. [name] Primary Care Eyvonne Burchfield: Julien Girt Other Clinician: Referring Kilyn Maragh: Treating Jaeleigh Monaco/Extender: Gretta Cool Weeks in Treatment: 0 Wound Status Wound Number: 2 Primary Etiology: Lymphedema Wound Location: Right, Anterior Lower Leg Wound Status: Healed - Epithelialized Wounding Event: Bump Comorbid History: Coronary Artery Disease, Hypertension, Type II Diabetes Date Acquired: 05/29/2022 Weeks Of Treatment: 0 Clustered Wound: No Wound Measurements Length: (cm) Width: (cm) Depth: (cm) Area: (cm) Volume: (cm) 0 % Reduction in Area: 0 % Reduction in Volume: 0 Epithelialization: None 0 Tunneling: No 0 Wound Description Classification: Partial Thickness Wound Margin: Distinct, outline attached Exudate Amount: None Present Foul Odor After Cleansing: No Slough/Fibrino No Wound Bed Granulation Amount: None Present (0%) Exposed Structure Necrotic Amount: None Present (0%) Fascia Exposed: No Fat Layer (Subcutaneous Tissue) Exposed:  No Tendon Exposed: No Muscle Exposed: No Joint Exposed: No Bone Exposed: No Periwound Skin Texture Texture Color No Abnormalities Noted: Yes No Abnormalities Noted: Yes Moisture No Abnormalities Noted: No Dry / Scaly: No Maceration: No Treatment Notes Wound #2 (Lower Leg) Wound Laterality: Right, Anterior Cleanser Peri-Wound Care Topical Primary Dressing Secondary Dressing [name], [name] ([id number]) [id number] Page 9 of 9 Secured With Compression Wrap Compression Stockings Add-Ons Electronic Signature(s) Signed: 08/14/2022 7:59:22 AM By: Brenton Grills Entered By: Brenton Grills on 08/13/2022 09:10:27 -------------------------------------------------------------------------------- Vitals Details Patient Name: Date of Service: [name], [name] 08/13/2022 8:00 A M Medical Record Number: [id number] Patient Account Number: [id number] Date of Birth/Sex: Treating RN: 05-10-1970 (52 y.o. [name] Primary Care Zora Glendenning: Julien Girt Other Clinician: Referring Anahi Belmar: Treating Uldine Fuster/Extender: Gretta Cool Weeks in Treatment: 0 Vital Signs Time Taken: 08:05 Temperature (F): 98.1 Height (in): 66 Pulse (bpm): 71 Weight (lbs): 377 Respiratory Rate (breaths/min): 18 Body Mass Index (BMI): 60.8 Blood Pressure (mmHg): 103/69 Reference Range: 80 - 120 mg / dl Electronic Signature(s) Signed: 08/14/2022 7:59:22 AM By: Brenton Grills Entered By: Brenton Grills on 08/13/2022 70:35:00

## 2022-08-19 ENCOUNTER — Ambulatory Visit: Payer: Medicaid Other | Admitting: Pulmonary Disease

## 2022-08-27 ENCOUNTER — Ambulatory Visit (HOSPITAL_BASED_OUTPATIENT_CLINIC_OR_DEPARTMENT_OTHER): Payer: Medicaid Other | Admitting: Pulmonary Disease

## 2022-08-30 ENCOUNTER — Telehealth: Payer: Self-pay | Admitting: Pulmonary Disease

## 2022-08-30 DIAGNOSIS — G4733 Obstructive sleep apnea (adult) (pediatric): Secondary | ICD-10-CM

## 2022-08-30 NOTE — Telephone Encounter (Signed)
New bipap titration order placed. ATC patient.  Left detailed message on VM (DPR) that a new order for her bipap titration had been placed.  Call back number given for any questions.

## 2022-09-02 ENCOUNTER — Encounter (HOSPITAL_BASED_OUTPATIENT_CLINIC_OR_DEPARTMENT_OTHER): Payer: Medicaid Other | Admitting: Internal Medicine

## 2022-09-03 NOTE — Telephone Encounter (Signed)
Noted  

## 2022-09-06 ENCOUNTER — Other Ambulatory Visit: Payer: Self-pay | Admitting: Endocrinology

## 2022-09-06 DIAGNOSIS — E1165 Type 2 diabetes mellitus with hyperglycemia: Secondary | ICD-10-CM

## 2022-09-12 ENCOUNTER — Encounter (HOSPITAL_BASED_OUTPATIENT_CLINIC_OR_DEPARTMENT_OTHER): Payer: Medicaid Other | Attending: Internal Medicine | Admitting: Internal Medicine

## 2022-09-14 ENCOUNTER — Other Ambulatory Visit: Payer: Self-pay | Admitting: Endocrinology

## 2022-09-14 DIAGNOSIS — Z794 Long term (current) use of insulin: Secondary | ICD-10-CM

## 2022-09-24 ENCOUNTER — Encounter: Payer: Self-pay | Admitting: Cardiology

## 2022-10-03 ENCOUNTER — Other Ambulatory Visit: Payer: Self-pay | Admitting: Cardiology

## 2022-10-05 ENCOUNTER — Other Ambulatory Visit: Payer: Self-pay | Admitting: Cardiology

## 2022-10-05 MED ORDER — FUROSEMIDE 40 MG PO TABS: 40.0000 mg | ORAL_TABLET | Freq: Every day | ORAL | 0 refills | Status: DC

## 2022-10-05 NOTE — Progress Notes (Signed)
Patient called in requesting refill of her Lasix 40 mg daily, states she got her potassium but not the Lasix.  Refill of 90 tabs, no refills sent to [facility] pharmacy on [address]

## 2022-10-07 ENCOUNTER — Ambulatory Visit: Payer: Medicaid Other | Admitting: Pulmonary Disease

## 2022-10-08 ENCOUNTER — Encounter (HOSPITAL_BASED_OUTPATIENT_CLINIC_OR_DEPARTMENT_OTHER): Payer: Medicaid Other | Admitting: Pulmonary Disease

## 2022-10-16 ENCOUNTER — Other Ambulatory Visit: Payer: Self-pay

## 2022-10-16 DIAGNOSIS — E1165 Type 2 diabetes mellitus with hyperglycemia: Secondary | ICD-10-CM

## 2022-10-16 MED ORDER — HUMULIN 70/30 (70-30) 100 UNIT/ML ~~LOC~~ SUSP
SUBCUTANEOUS | 0 refills | Status: DC
Start: 2022-10-16 — End: 2022-11-01

## 2022-10-16 MED ORDER — HUMULIN 70/30 (70-30) 100 UNIT/ML ~~LOC~~ SUSP
SUBCUTANEOUS | 0 refills | Status: DC
Start: 2022-10-16 — End: 2022-10-16

## 2022-10-16 MED ORDER — METFORMIN HCL ER 500 MG PO TB24
500.0000 mg | ORAL_TABLET | Freq: Every day | ORAL | 0 refills | Status: DC
Start: 2022-10-16 — End: 2023-01-10

## 2022-10-17 ENCOUNTER — Other Ambulatory Visit: Payer: Self-pay

## 2022-10-17 DIAGNOSIS — E1165 Type 2 diabetes mellitus with hyperglycemia: Secondary | ICD-10-CM

## 2022-10-17 MED ORDER — EMPAGLIFLOZIN 25 MG PO TABS: 25.0000 mg | ORAL_TABLET | Freq: Every day | ORAL | 1 refills | Status: DC

## 2022-11-01 ENCOUNTER — Encounter: Payer: Self-pay | Admitting: "Endocrinology

## 2022-11-01 ENCOUNTER — Ambulatory Visit (INDEPENDENT_AMBULATORY_CARE_PROVIDER_SITE_OTHER): Payer: Medicaid Other | Admitting: "Endocrinology

## 2022-11-01 VITALS — BP 130/60 | HR 71 | Ht 66.0 in | Wt 394.8 lb

## 2022-11-01 DIAGNOSIS — E782 Mixed hyperlipidemia: Secondary | ICD-10-CM | POA: Diagnosis not present

## 2022-11-01 DIAGNOSIS — E039 Hypothyroidism, unspecified: Secondary | ICD-10-CM

## 2022-11-01 DIAGNOSIS — Z794 Long term (current) use of insulin: Secondary | ICD-10-CM

## 2022-11-01 DIAGNOSIS — E1165 Type 2 diabetes mellitus with hyperglycemia: Secondary | ICD-10-CM

## 2022-11-01 LAB — BASIC METABOLIC PANEL
BUN: 27 mg/dL — ABNORMAL HIGH (ref 6–23)
CO2: 33 meq/L — ABNORMAL HIGH (ref 19–32)
Calcium: 9.4 mg/dL (ref 8.4–10.5)
Chloride: 101 meq/L (ref 96–112)
Creatinine, Ser: 0.93 mg/dL (ref 0.40–1.20)
GFR: 71.22 mL/min (ref >60.00)
Glucose, Bld: 100 mg/dL — ABNORMAL HIGH (ref 70–99)
Potassium: 4 meq/L (ref 3.5–5.1)
Sodium: 142 meq/L (ref 135–145)

## 2022-11-01 LAB — TSH: TSH: 3.57 u[IU]/mL (ref 0.35–5.50)

## 2022-11-01 MED ORDER — SEMAGLUTIDE(0.25 OR 0.5MG/DOS) 2 MG/3ML ~~LOC~~ SOPN
0.2500 mg | PEN_INJECTOR | SUBCUTANEOUS | 1 refills | Status: DC
Start: 2022-11-01 — End: 2023-05-05

## 2022-11-01 MED ORDER — HUMULIN R U-500 KWIKPEN 500 UNIT/ML ~~LOC~~ SOPN
80.0000 [IU] | PEN_INJECTOR | Freq: Two times a day (BID) | SUBCUTANEOUS | 2 refills | Status: DC
Start: 2022-11-01 — End: 2023-02-05

## 2022-11-01 NOTE — Patient Instructions (Addendum)
Stop Januvia if ozempic is covered Stop Humulin 70/30 if Humulin R U500 insulin is covered  Continue rest the same

## 2022-11-01 NOTE — Progress Notes (Signed)
Outpatient Endocrinology Note Caitlin [place], MD  11/01/22   [name] [birth year] [id number]  Referring Provider: Elizabeth Palau, FNP Primary Care Provider: Julien Girt, PA-C Reason for consultation: Subjective   Assessment & Plan  Diagnoses and all orders for this visit:  Uncontrolled type 2 diabetes mellitus with hyperglycemia, with long-term current use of insulin (HCC) -     insulin regular human CONCENTRATED (HUMULIN R U-500 KWIKPEN) 500 UNIT/ML KwikPen; Inject 80 Units into the skin 2 (two) times daily with a meal. -     Semaglutide,0.25 or 0.5MG /DOS, 2 MG/3ML SOPN; Inject 0.25 mg into the skin once a week. 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, neuropathy, MI  Lab Results  Component Value Date   GFR 62.59 02/13/2022   Hba1c goal less than 7, current Hba1c is  Lab Results  Component Value Date   HGBA1C 9.5 (A) 02/13/2022   Will recommend the following: Stop Januvia if ozempic is covered Stop Humulin 70/30 if Humulin R U500 insulin is covered  U-500 80 units twice daily, increase by 10 units if blood sugars are higher than 150 Continue rest the same  No known contraindications/side effects to any of above medications No history of MEN syndrome/medullary thyroid cancer/pancreatitis or pancreatic cancer in self or family Had a long discussion with the patient about GLP-1, patient was afraid of the side effects, discussed at length.  Patient is interested in trying it  -Last LD and Tg are as follows: Lab Results  Component Value Date   LDLCALC 48 05/31/2021    Lab Results  Component Value Date   TRIG 175.0 (H) 05/31/2021   -On rosuvastatin 40 mg QD -Follow low fat diet and exercise   -Blood pressure goal  <140/90 - Microalbumin/creatinine goal is < 30 -Last MA/Cr is as follows: Lab Results  Component Value Date   MICROALBUR 12.2 (H) 08/21/2021   -not on ACE/ARB  -diet changes including salt restriction -limit eating outside -counseled BP targets per standards of diabetes care -uncontrolled blood pressure can lead to retinopathy, nephropathy and cardiovascular and atherosclerotic heart disease  Reviewed and counseled on: -A1C target -Blood sugar targets -Complications of uncontrolled diabetes  -Checking blood sugar before meals and bedtime and bring log next visit -All medications with mechanism of action and side effects -Hypoglycemia management: rule of 15's, Glucagon Emergency Kit and medical alert ID -low-carb low-fat plate-method diet -At least 20 minutes of physical activity per day -Annual dilated retinal eye exam and foot exam -compliance and follow up needs -follow up as scheduled or earlier if problem gets worse  Call if blood sugar is less than 70 or consistently above 250    Take a 15 gm snack of carbohydrate at bedtime before you go to sleep if your blood sugar is less than 100.    If you are going to fast after midnight for a test or procedure, ask your physician for instructions on how to reduce/decrease your insulin dose.    Call if blood sugar is less than 70 or consistently above 250  -Treating a low sugar by rule of 15  (15 gms of sugar every 15 min until sugar is more than 70) If you feel  your sugar is low, test your sugar to be sure If your sugar is low (less than 70), then take 15 grams of a fast acting Carbohydrate (3-4 glucose tablets or glucose gel or 4 ounces of juice or regular soda) Recheck your sugar 15 min after treating low to make sure it is more than 70 If sugar is still less than 70, treat again with 15 grams of carbohydrate          Don't drive the hour of hypoglycemia  If unconscious/unable to eat or drink by mouth, use glucagon injection or  nasal spray baqsimi and call 911. Can repeat again in 15 min if still unconscious.   Return in about 13 days (around 11/14/2022) for visit, labs today.   I have reviewed current medications, nurse's notes, allergies, vital signs, past medical and surgical history, family medical history, and social history for this encounter. Counseled patient on symptoms, examination findings, lab findings, imaging results, treatment decisions and monitoring and prognosis. The patient understood the recommendations and agrees with the treatment plan. All questions regarding treatment plan were fully answered.  Caitlin Victoria, MD  11/01/22    History of Present Illness [name] is a 52 y.o. year old female who presents for evaluation of Type II diabetes mellitus.  [name] was first diagnosed in age 49, 43.    Home diabetes regimen: Humulin 70/30 150 units before dinner and 130 units before dinner Metformin XR 500 mg 2 pills bud Jardiance 25 mg every day  Januvia 25 mg every day  COMPLICATIONS +  MI/Stroke +  retinopathy +  neuropathy -  nephropathy  BLOOD SUGAR DATA  CGM interpretation: At today's visit, we reviewed her CGM downloads. The full report is scanned in the media. Reviewing the CGM trends, BG are very variable across th day with highs particularly overnight and in evening.   Physical Exam  BP 130/60   Pulse 71   Ht 5\' 6"  (1.676 m)   Wt (!) 394 lb 12.8 oz (179.1 kg)   SpO2 95%   BMI 63.72 kg/m    Constitutional: well developed, well nourished Head: normocephalic, atraumatic Eyes: sclera anicteric, no redness Neck: supple Lungs: normal respiratory effort Neurology: alert and oriented Skin: dry, no appreciable rashes Musculoskeletal: no appreciable defects Psychiatric: normal mood and affect Diabetic Foot Exam - Simple   No data filed      Current Medications Patient's Medications  New Prescriptions   INSULIN REGULAR HUMAN CONCENTRATED (HUMULIN R  U-500 KWIKPEN) 500 UNIT/ML KWIKPEN    Inject 80 Units into the skin 2 (two) times daily with a meal.   SEMAGLUTIDE,0.25 OR 0.5MG /DOS, 2 MG/3ML SOPN    Inject 0.25 mg into the skin once a week.  Previous Medications   ACETAMINOPHEN (TYLENOL) 500 MG TABLET    Take 1,000 mg by mouth every 6 (six) hours as needed for moderate pain or headache.   ASPIRIN EC 81 MG TABLET    Take 81 mg by mouth daily. Swallow whole.   B COMPLEX-C (SUPER B COMPLEX PO)    Take 1 tablet by mouth daily.   CHOLECALCIFEROL (VITAMIN D3) 50 MCG (2000 UT) CAPSULE    Take 2,000 Units by mouth daily.   CONTINUOUS GLUCOSE SENSOR (FREESTYLE LIBRE 3 SENSOR) MISC    APPLY 1 SENSOR ON UPPER ARM EVERY 14 DAYS FOR CONTINUOUS GLUCOSE MONITORING   DULOXETINE (CYMBALTA) 30 MG CAPSULE    Take 30 mg by mouth daily.   EMPAGLIFLOZIN (JARDIANCE) 25 MG  TABS TABLET    Take 1 tablet (25 mg total) by mouth daily before breakfast.   FUROSEMIDE (LASIX) 40 MG TABLET    Take 1 tablet (40 mg total) by mouth daily.   GABAPENTIN (NEURONTIN) 300 MG CAPSULE    Take 2 capsules by mouth 3 (three) times daily.   INSULIN SYRINGES, DISPOSABLE, U-100 1 ML MISC    Use to inject insulin daily   LEVOTHYROXINE (SYNTHROID, LEVOTHROID) 50 MCG TABLET    Take 50 mcg by mouth daily.   METFORMIN (GLUCOPHAGE-XR) 500 MG 24 HR TABLET    Take 1 tablet (500 mg total) by mouth daily with breakfast. Take 4 tablets with breakfast daily   METOPROLOL SUCCINATE (TOPROL-XL) 50 MG 24 HR TABLET    Take 1 tablet by mouth once daily   NYSTATIN CREAM (MYCOSTATIN)    Apply 1 application  topically 2 (two) times daily. Abdominal folds   OMEPRAZOLE (PRILOSEC) 40 MG CAPSULE    Take 40 mg by mouth 2 (two) times daily.   POTASSIUM CHLORIDE (KLOR-CON) 10 MEQ TABLET    Take 2 tablets (20 mEq total) by mouth daily. PATIENT NEED TO SCHEDULE APPOINTMENT FOR FUTURE REFILLS FIRST ATTEMPT   ROSUVASTATIN (CRESTOR) 40 MG TABLET    Take 40 mg by mouth daily.   SENNOSIDES 17.2 MG TABS    Take 34.4 mg by  mouth 3 (three) times a week. No set days  Modified Medications   No medications on file  Discontinued Medications   INSULIN NPH-REGULAR HUMAN (HUMULIN 70/30) (70-30) 100 UNIT/ML INJECTION    INJECT 150 UNITS WITH BREAKFAST AND 125 UNITS WITH SUPPER   SITAGLIPTIN (JANUVIA) 25 MG TABLET    Take 25 mg by mouth daily.    Allergies Allergies  Allergen Reactions   Irbesartan Other (See Comments)    Dizziness, muscle pain   Clindamycin/Lincomycin Other (See Comments) and Nausea And Vomiting    Stomach pain    Latex Rash   Ultram [Tramadol] Rash    Past Medical History Past Medical History:  Diagnosis Date   CAD (coronary artery disease)    a. s/p NSTEMI, LHC 03/2020 dRCA 25%, m-LAD-1 95%-0% (DES), mLAD-2 50%, LVEDP moderately elevated, 27 mmHg.   Chronic diastolic heart failure (HCC)    a. LHC/echo 3/22 c/ elevated LVEDP, EF 50-55%, hypokinesis; b. Echo 10/22 EF 60-65%, mild LVH, borderline dilatation of ascending aorta, 39mm.   DDD (degenerative disc disease), lumbar    Diabetes mellitus without complication (HCC)    GERD (gastroesophageal reflux disease)    Hyperlipidemia    Hypertension    Hypothyroidism    Lumbar herniated disc    Major depressive disorder    Morbid obesity (HCC)     Past Surgical History Past Surgical History:  Procedure Laterality Date   CORONARY STENT INTERVENTION N/A 03/28/2020   Procedure: CORONARY STENT INTERVENTION;  Surgeon: Corky Crafts, MD;  Location: [facility] INVASIVE CV LAB;  Service: Cardiovascular;  Laterality: N/A;   DILATION AND CURETTAGE OF UTERUS     INCISION AND DRAINAGE PERIRECTAL ABSCESS  06/21/2014   INCISION AND DRAINAGE PERIRECTAL ABSCESS Bilateral 06/21/2014   Procedure: IRRIGATION AND DEBRIDEMENT PERIRECTAL ABSCESS;  Surgeon: Abigail Miyamoto, MD;  Location: [facility] OR;  Service: General;  Laterality: Bilateral;   LEFT HEART CATH AND CORONARY ANGIOGRAPHY N/A 03/28/2020   Procedure: LEFT HEART CATH AND CORONARY ANGIOGRAPHY;  Surgeon:  Corky Crafts, MD;  Location: [facility] INVASIVE CV LAB;  Service: Cardiovascular;  Laterality: N/A;    Family  History family history includes Diabetes in her father and mother.  Social History Social History   Socioeconomic History   Marital status: Single    Spouse name: Not on file   Number of children: Not on file   Years of education: Not on file   Highest education level: Not on file  Occupational History   Not on file  Tobacco Use   Smoking status: Never    Passive exposure: Past   Smokeless tobacco: Never  Vaping Use   Vaping status: Never Used  Substance and Sexual Activity   Alcohol use: No   Drug use: No   Sexual activity: Not on file  Other Topics Concern   Not on file  Social History Narrative   Not on file   Social Determinants of Health   Financial Resource Strain: High Risk (06/21/2022)   Received from [facility], [facility]   Overall Financial Resource Strain (CARDIA)    Difficulty of Paying Living Expenses: Very hard  Food Insecurity: Food Insecurity Present (06/21/2022)   Received from [facility], [facility]   Hunger Vital Sign    Worried About Running Out of Food in the Last Year: Often true    Ran Out of Food in the Last Year: Often true  Transportation Needs: No Transportation Needs (06/21/2022)   Received from [employer], [facility]   PRAPARE - Transportation    Lack of Transportation (Medical): No    Lack of Transportation (Non-Medical): No  Physical Activity: Inactive (03/29/2020)   Exercise Vital Sign    Days of Exercise per Week: 0 days    Minutes of Exercise per Session: 0 min  Stress: Not on file  Social Connections: Unknown (05/29/2021)   Received from [facility], [facility]   Social Network    Social Network: Not on file  Intimate Partner Violence: Unknown (04/30/2021)   Received from [facility], [facility]   HITS    Physically Hurt: Not on file    Insult or Talk Down To: Not on file    Threaten Physical  Harm: Not on file    Scream or Curse: Not on file    Lab Results  Component Value Date   HGBA1C 9.5 (A) 02/13/2022   HGBA1C 7.2 (A) 05/31/2021   HGBA1C 9.2 (A) 02/27/2021   Lab Results  Component Value Date   CHOL 121 05/31/2021   Lab Results  Component Value Date   HDL 38.60 (L) 05/31/2021   Lab Results  Component Value Date   LDLCALC 48 05/31/2021   Lab Results  Component Value Date   TRIG 175.0 (H) 05/31/2021   Lab Results  Component Value Date   CHOLHDL 3 05/31/2021   Lab Results  Component Value Date   CREATININE 1.04 02/13/2022   Lab Results  Component Value Date   GFR 62.59 02/13/2022   Lab Results  Component Value Date   MICROALBUR 12.2 (H) 08/21/2021      Component Value Date/Time   NA 141 02/13/2022 1116   NA 144 01/10/2021 1445   K 3.5 02/13/2022 1116   CL 98 02/13/2022 1116   CO2 36 (H) 02/13/2022 1116   GLUCOSE 82 02/13/2022 1116   BUN 28 (H) 02/13/2022 1116   BUN 19 01/10/2021 1445   CREATININE 1.04 02/13/2022 1116   CALCIUM 9.0 02/13/2022 1116   PROT 7.1 02/13/2022 1116   ALBUMIN 3.9 02/13/2022 1116   AST 26 02/13/2022 1116   ALT 27 02/13/2022 1116  ALKPHOS 67 02/13/2022 1116   BILITOT 0.8 02/13/2022 1116   GFRNONAA >60 07/21/2021 2026   GFRAA >60 06/23/2014 0357      Latest Ref Rng & Units 02/13/2022   11:16 AM 11/15/2021    9:37 AM 10/31/2021    9:55 AM  BMP  Glucose 70 - 99 mg/dL 82  564  332   BUN 6 - 23 mg/dL 28  26  27    Creatinine 0.40 - 1.20 mg/dL 9.51  8.84  1.66   Sodium 135 - 145 mEq/L 141  141  138   Potassium 3.5 - 5.1 mEq/L 3.5  3.5  3.7   Chloride 96 - 112 mEq/L 98  96  96   CO2 19 - 32 mEq/L 36  36  33   Calcium 8.4 - 10.5 mg/dL 9.0  9.5  9.1        Component Value Date/Time   WBC 9.0 07/21/2021 2026   RBC 3.86 (L) 07/21/2021 2026   HGB 10.2 (L) 07/21/2021 2026   HCT 33.2 (L) 07/21/2021 2026   PLT 207 07/21/2021 2026   MCV 86.0 07/21/2021 2026   MCH 26.4 07/21/2021 2026   MCHC 30.7 07/21/2021 2026    RDW 15.1 07/21/2021 2026   LYMPHSABS 2.3 04/09/2021 0233   MONOABS 0.7 04/09/2021 0233   EOSABS 0.3 04/09/2021 0233   BASOSABS 0.0 04/09/2021 0233     Parts of this note may have been dictated using voice recognition software. There may be variances in spelling and vocabulary which are unintentional. Not all errors are proofread. Please notify the Thereasa Parkin if any discrepancies are noted or if the meaning of any statement is not clear.

## 2022-11-02 ENCOUNTER — Other Ambulatory Visit: Payer: Self-pay | Admitting: "Endocrinology

## 2022-11-02 DIAGNOSIS — E1165 Type 2 diabetes mellitus with hyperglycemia: Secondary | ICD-10-CM

## 2022-11-04 ENCOUNTER — Other Ambulatory Visit: Payer: Self-pay

## 2022-11-04 ENCOUNTER — Encounter: Payer: Self-pay | Admitting: "Endocrinology

## 2022-11-14 ENCOUNTER — Other Ambulatory Visit: Payer: Self-pay | Admitting: Cardiology

## 2022-11-15 ENCOUNTER — Ambulatory Visit (HOSPITAL_BASED_OUTPATIENT_CLINIC_OR_DEPARTMENT_OTHER): Payer: Medicaid Other | Attending: Pulmonary Disease | Admitting: Pulmonary Disease

## 2022-11-22 ENCOUNTER — Other Ambulatory Visit (HOSPITAL_COMMUNITY): Payer: Self-pay

## 2022-11-22 ENCOUNTER — Other Ambulatory Visit: Payer: Self-pay

## 2022-11-22 ENCOUNTER — Telehealth: Payer: Self-pay

## 2022-11-22 DIAGNOSIS — Z794 Long term (current) use of insulin: Secondary | ICD-10-CM

## 2022-11-22 MED ORDER — CLICKFINE PEN NEEDLES 31G X 6 MM MISC: 1.0000 | Freq: Two times a day (BID) | 0 refills | Status: DC

## 2022-11-22 MED ORDER — FREESTYLE LIBRE 3 PLUS SENSOR MISC
1.0000 | 3 refills | Status: DC
Start: 2022-11-22 — End: 2023-01-16

## 2022-11-22 NOTE — Telephone Encounter (Signed)
Pharmacy Patient Advocate Encounter   Received notification from Pt Calls Messages that prior authorization for Ozempic is required/requested.   Insurance verification completed.   The patient is insured through [facility] [employer] .   Per test claim: PA required; PA started via CoverMyMeds. KEY [id number]) . Waiting for clinical questions to populate.

## 2022-11-22 NOTE — Telephone Encounter (Signed)
Patient is stating that she needs a prior auth on her Ozempic, please advise thanks

## 2022-11-29 NOTE — Telephone Encounter (Signed)
Clinical info has been submitted

## 2022-12-04 NOTE — Telephone Encounter (Signed)
Pharmacy Patient Advocate Encounter  Received notification from [facility] that Prior Authorization for Ozempic has been APPROVED through 11/29/2023

## 2022-12-05 ENCOUNTER — Ambulatory Visit: Payer: Medicaid Other | Admitting: "Endocrinology

## 2022-12-05 NOTE — Telephone Encounter (Signed)
Patient is aware 

## 2022-12-09 ENCOUNTER — Telehealth: Payer: Self-pay | Admitting: Pulmonary Disease

## 2022-12-09 NOTE — Telephone Encounter (Signed)
Pt calling in bc she has missed her sleep study and needs to be rescheduled

## 2022-12-10 NOTE — Telephone Encounter (Signed)
Pt calling in bc she still hasn't heard anything back about getting rescheduled for her sleep study but is sch to see Dr. Isaiah Serge to go over sleep study. Pt cancelled due to being sick.

## 2022-12-11 ENCOUNTER — Ambulatory Visit: Payer: Medicaid Other | Admitting: Pulmonary Disease

## 2022-12-11 ENCOUNTER — Other Ambulatory Visit: Payer: Self-pay | Admitting: Cardiology

## 2022-12-11 NOTE — Telephone Encounter (Signed)
Patient checking on message for rescheduling sleep study. Patient phone number is [phone].

## 2022-12-16 NOTE — Telephone Encounter (Signed)
Patient has cancelled her appt 6 times I have sent a message to the Sleep lab about resc her appt

## 2022-12-31 ENCOUNTER — Other Ambulatory Visit: Payer: Self-pay | Admitting: Cardiology

## 2023-01-03 ENCOUNTER — Ambulatory Visit: Payer: Medicaid Other | Admitting: Cardiology

## 2023-01-06 ENCOUNTER — Other Ambulatory Visit: Payer: Self-pay | Admitting: "Endocrinology

## 2023-01-06 DIAGNOSIS — E1165 Type 2 diabetes mellitus with hyperglycemia: Secondary | ICD-10-CM

## 2023-01-07 ENCOUNTER — Encounter: Payer: Self-pay | Admitting: "Endocrinology

## 2023-01-07 ENCOUNTER — Telehealth (INDEPENDENT_AMBULATORY_CARE_PROVIDER_SITE_OTHER): Payer: Medicaid Other | Admitting: "Endocrinology

## 2023-01-07 DIAGNOSIS — E782 Mixed hyperlipidemia: Secondary | ICD-10-CM

## 2023-01-07 DIAGNOSIS — Z7984 Long term (current) use of oral hypoglycemic drugs: Secondary | ICD-10-CM | POA: Diagnosis not present

## 2023-01-07 DIAGNOSIS — Z794 Long term (current) use of insulin: Secondary | ICD-10-CM | POA: Diagnosis not present

## 2023-01-07 DIAGNOSIS — E1165 Type 2 diabetes mellitus with hyperglycemia: Secondary | ICD-10-CM

## 2023-01-07 DIAGNOSIS — E039 Hypothyroidism, unspecified: Secondary | ICD-10-CM

## 2023-01-07 NOTE — Progress Notes (Addendum)
The patient reports they are currently: [place]. This visit was conducted on the video with the patient on the date of service. I spent an additional 5-10 minutes on pre- and post-visit activities on the date of service.   The patient was physically located in [place] or a state in which I am permitted to provide care. The patient and/or parent/guardian understood that s/he may incur co-pays and cost sharing, and agreed to the telemedicine visit. The visit was reasonable and appropriate under the circumstances given the patient's presentation at the time.  The patient and/or parent/guardian has been advised of the potential risks and limitations of this mode of treatment (including, but not limited to, the absence of in-person examination) and has agreed to be treated using telemedicine. The patient's/patient's family's questions regarding telemedicine have been answered.   The patient and/or parent/guardian has also been advised to contact their provider's office for worsening conditions, and seek emergency medical treatment and/or call 911 if the patient deems either necessary.     Outpatient Endocrinology Note Caitlin [place], MD  01/07/23   [name] 1970-02-21 [id number]  Referring Provider: Julien Girt, PA* Primary Care Provider: Julien Girt, PA-C Reason for consultation: Subjective   Assessment & Plan  Diagnoses and all orders for this visit:  Uncontrolled type 2 diabetes mellitus with hyperglycemia, with long-term current use of insulin (HCC)  Mixed hyperlipidemia  Long term (current) use of oral hypoglycemic drugs  Acquired hypothyroidism  Long-term insulin use (HCC)   Hypothyroidism, on levothyroxine 50 mcg daily Last TSH WNL 12/2022 repeat TSH WNL  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, neuropathy, MI  Lab Results  Component Value Date   GFR 71.22 11/01/2022   Hba1c goal less than 7, current Hba1c is  Lab Results  Component  Value Date   HGBA1C 9.5 (A) 02/13/2022   Will recommend the following: Stop Januvia if ozempic is covered U-500 100 units twice daily before break fast and before supper 30 before min before meals Januvia 25 mg every day Metformin XR 500 mg 2 pills bid Jardiance 25 mg every day   Continue rest the same  No known contraindications/side effects to any of above medications No history of MEN syndrome/medullary thyroid cancer/pancreatitis or pancreatic cancer in self or family Had a long discussion with the patient about GLP-1, patient was afraid of the side effects, discussed at length.  Patient is interested in trying it  -Last LD and Tg are as follows: Lab Results  Component Value Date   LDLCALC 48 05/31/2021    Lab Results  Component Value Date   TRIG 175.0 (H) 05/31/2021   -On rosuvastatin 40 mg QD -Follow low fat diet and exercise   -Blood pressure goal <140/90 - Microalbumin/creatinine goal is < 30 -Last MA/Cr is as follows: Lab Results  Component Value Date   MICROALBUR 12.2 (H) 08/21/2021   -not on ACE/ARB  -diet changes including salt restriction -limit eating outside -counseled BP targets per standards of diabetes care -uncontrolled blood pressure can lead to retinopathy, nephropathy and cardiovascular and atherosclerotic heart disease  Reviewed and counseled on: -A1C target -Blood sugar targets -Complications of uncontrolled diabetes  -Checking blood sugar before meals and bedtime and bring log next visit -All medications with mechanism of action and side effects -Hypoglycemia management: rule of 15's, Glucagon Emergency Kit and medical alert ID -low-carb low-fat plate-method diet -At least 20 minutes of physical activity per day -Annual dilated retinal eye exam and foot exam -compliance and follow up  needs -follow up as scheduled or earlier if problem gets worse  Call if blood sugar is less than 70 or consistently above 250    Take a 15 gm snack of  carbohydrate at bedtime before you go to sleep if your blood sugar is less than 100.    If you are going to fast after midnight for a test or procedure, ask your physician for instructions on how to reduce/decrease your insulin dose.    Call if blood sugar is less than 70 or consistently above 250  -Treating a low sugar by rule of 15  (15 gms of sugar every 15 min until sugar is more than 70) If you feel your sugar is low, test your sugar to be sure If your sugar is low (less than 70), then take 15 grams of a fast acting Carbohydrate (3-4 glucose tablets or glucose gel or 4 ounces of juice or regular soda) Recheck your sugar 15 min after treating low to make sure it is more than 70 If sugar is still less than 70, treat again with 15 grams of carbohydrate          Don't drive the hour of hypoglycemia  If unconscious/unable to eat or drink by mouth, use glucagon injection or nasal spray baqsimi and call 911. Can repeat again in 15 min if still unconscious.   Return in about 9 days (around 01/16/2023).   I have reviewed current medications, nurse's notes, allergies, vital signs, past medical and surgical history, family medical history, and social history for this encounter. Counseled patient on symptoms, examination findings, lab findings, imaging results, treatment decisions and monitoring and prognosis. The patient understood the recommendations and agrees with the treatment plan. All questions regarding treatment plan were fully answered.  Caitlin Merriman, MD  01/07/23    History of Present Illness [name] is a 52 y.o. year old female who presents for follow up of Type II diabetes mellitus.  [name] was first diagnosed in age 38, 32.    Home diabetes regimen: U-500 90 units twice daily before break fast and before supper Januvia 25 mg every day Metformin XR 500 mg 2 pills bid Jardiance 25 mg every day   Stopped Humulin 70/30 150 units before dinner and 130 units  before dinner  COMPLICATIONS +  MI/Stroke +  retinopathy +  neuropathy -  nephropathy  BLOOD SUGAR DATA No sensor today BG in 250s checked at home per recall  Physical Exam  There were no vitals taken for this visit.   Constitutional: well developed, well nourished Head: normocephalic, atraumatic Eyes: sclera anicteric, no redness Neck: supple Lungs: normal respiratory effort Neurology: alert and oriented Skin: dry, no appreciable rashes Musculoskeletal: no appreciable defects Psychiatric: normal mood and affect Diabetic Foot Exam - Simple   No data filed      Current Medications Patient's Medications  New Prescriptions   No medications on file  Previous Medications   ACETAMINOPHEN (TYLENOL) 500 MG TABLET    Take 1,000 mg by mouth every 6 (six) hours as needed for moderate pain or headache.   ASPIRIN EC 81 MG TABLET    Take 81 mg by mouth daily. Swallow whole.   B COMPLEX-C (SUPER B COMPLEX PO)    Take 1 tablet by mouth daily.   CHOLECALCIFEROL (VITAMIN D3) 50 MCG (2000 UT) CAPSULE    Take 2,000 Units by mouth daily.   CONTINUOUS GLUCOSE SENSOR (FREESTYLE LIBRE 3 PLUS SENSOR) MISC    Inject  1 Device into the skin continuous. Change every 15 days   CONTINUOUS GLUCOSE SENSOR (FREESTYLE LIBRE 3 SENSOR) MISC    APPLY 1 SENSOR ON UPPER ARM EVERY 14 DAYS FOR CONTINUOUS GLUCOSE MONITORING   DULOXETINE (CYMBALTA) 30 MG CAPSULE    Take 30 mg by mouth daily.   EMPAGLIFLOZIN (JARDIANCE) 25 MG TABS TABLET    Take 1 tablet (25 mg total) by mouth daily before breakfast.   FUROSEMIDE (LASIX) 40 MG TABLET    Take 1 tablet by mouth once daily   GABAPENTIN (NEURONTIN) 300 MG CAPSULE    Take 2 capsules by mouth 3 (three) times daily.   INSULIN REGULAR HUMAN CONCENTRATED (HUMULIN R U-500 KWIKPEN) 500 UNIT/ML KWIKPEN    Inject 80 Units into the skin 2 (two) times daily with a meal.   INSULIN SYRINGES, DISPOSABLE, U-100 1 ML MISC    Use to inject insulin daily   LEVOTHYROXINE (SYNTHROID,  LEVOTHROID) 50 MCG TABLET    Take 50 mcg by mouth daily.   METFORMIN (GLUCOPHAGE-XR) 500 MG 24 HR TABLET    Take 1 tablet (500 mg total) by mouth daily with breakfast. Take 4 tablets with breakfast daily   METOPROLOL SUCCINATE (TOPROL-XL) 50 MG 24 HR TABLET    Take 1 tablet by mouth once daily   NYSTATIN CREAM (MYCOSTATIN)    Apply 1 application  topically 2 (two) times daily. Abdominal folds   OMEPRAZOLE (PRILOSEC) 40 MG CAPSULE    Take 40 mg by mouth 2 (two) times daily.   POTASSIUM CHLORIDE (KLOR-CON) 10 MEQ TABLET    Take 2 tablets by mouth once daily   RELION PEN NEEDLES 31G X 6 MM MISC    USE 1  TWICE DAILY   ROSUVASTATIN (CRESTOR) 40 MG TABLET    Take 40 mg by mouth daily.   SEMAGLUTIDE,0.25 OR 0.5MG /DOS, 2 MG/3ML SOPN    Inject 0.25 mg into the skin once a week.   SENNOSIDES 17.2 MG TABS    Take 34.4 mg by mouth 3 (three) times a week. No set days  Modified Medications   No medications on file  Discontinued Medications   No medications on file    Allergies Allergies  Allergen Reactions   Irbesartan Other (See Comments)    Dizziness, muscle pain   Clindamycin/Lincomycin Other (See Comments) and Nausea And Vomiting    Stomach pain    Latex Rash   Ultram [Tramadol] Rash    Past Medical History Past Medical History:  Diagnosis Date   CAD (coronary artery disease)    a. s/p NSTEMI, LHC 03/2020 dRCA 25%, m-LAD-1 95%-0% (DES), mLAD-2 50%, LVEDP moderately elevated, 27 mmHg.   Chronic diastolic heart failure (HCC)    a. LHC/echo 3/22 c/ elevated LVEDP, EF 50-55%, hypokinesis; b. Echo 10/22 EF 60-65%, mild LVH, borderline dilatation of ascending aorta, 39mm.   DDD (degenerative disc disease), lumbar    Diabetes mellitus without complication (HCC)    GERD (gastroesophageal reflux disease)    Hyperlipidemia    Hypertension    Hypothyroidism    Lumbar herniated disc    Major depressive disorder    Morbid obesity (HCC)     Past Surgical History Past Surgical History:   Procedure Laterality Date   CORONARY STENT INTERVENTION N/A 03/28/2020   Procedure: CORONARY STENT INTERVENTION;  Surgeon: Corky Crafts, MD;  Location: [facility] INVASIVE CV LAB;  Service: Cardiovascular;  Laterality: N/A;   DILATION AND CURETTAGE OF UTERUS     INCISION AND DRAINAGE  PERIRECTAL ABSCESS  06/21/2014   INCISION AND DRAINAGE PERIRECTAL ABSCESS Bilateral 06/21/2014   Procedure: IRRIGATION AND DEBRIDEMENT PERIRECTAL ABSCESS;  Surgeon: Abigail Miyamoto, MD;  Location: [facility] OR;  Service: General;  Laterality: Bilateral;   LEFT HEART CATH AND CORONARY ANGIOGRAPHY N/A 03/28/2020   Procedure: LEFT HEART CATH AND CORONARY ANGIOGRAPHY;  Surgeon: Corky Crafts, MD;  Location: [facility] INVASIVE CV LAB;  Service: Cardiovascular;  Laterality: N/A;    Family History family history includes Diabetes in her father and mother.  Social History Social History   Socioeconomic History   Marital status: Single    Spouse name: Not on file   Number of children: Not on file   Years of education: Not on file   Highest education level: Not on file  Occupational History   Not on file  Tobacco Use   Smoking status: Never    Passive exposure: Past   Smokeless tobacco: Never  Vaping Use   Vaping status: Never Used  Substance and Sexual Activity   Alcohol use: No   Drug use: No   Sexual activity: Not on file  Other Topics Concern   Not on file  Social History Narrative   Not on file   Social Determinants of Health   Financial Resource Strain: High Risk (06/21/2022)   Received from [facility], [facility]   Overall Financial Resource Strain (CARDIA)    Difficulty of Paying Living Expenses: Very hard  Food Insecurity: Food Insecurity Present (06/21/2022)   Received from [facility], [facility]   Hunger Vital Sign    Worried About Running Out of Food in the Last Year: Often true    Ran Out of Food in the Last Year: Often true  Transportation Needs: No Transportation Needs (06/21/2022)    Received from [employer], [facility]   PRAPARE - Transportation    Lack of Transportation (Medical): No    Lack of Transportation (Non-Medical): No  Physical Activity: Inactive (03/29/2020)   Exercise Vital Sign    Days of Exercise per Week: 0 days    Minutes of Exercise per Session: 0 min  Stress: Not on file  Social Connections: Unknown (05/29/2021)   Received from [facility], [facility]   Social Network    Social Network: Not on file  Intimate Partner Violence: Unknown (04/30/2021)   Received from [name], [facility]   HITS    Physically Hurt: Not on file    Insult or Talk Down To: Not on file    Threaten Physical Harm: Not on file    Scream or Curse: Not on file    Lab Results  Component Value Date   HGBA1C 9.5 (A) 02/13/2022   HGBA1C 7.2 (A) 05/31/2021   HGBA1C 9.2 (A) 02/27/2021   Lab Results  Component Value Date   CHOL 121 05/31/2021   Lab Results  Component Value Date   HDL 38.60 (L) 05/31/2021   Lab Results  Component Value Date   LDLCALC 48 05/31/2021   Lab Results  Component Value Date   TRIG 175.0 (H) 05/31/2021   Lab Results  Component Value Date   CHOLHDL 3 05/31/2021   Lab Results  Component Value Date   CREATININE 0.93 11/01/2022   Lab Results  Component Value Date   GFR 71.22 11/01/2022   Lab Results  Component Value Date   MICROALBUR 12.2 (H) 08/21/2021      Component Value Date/Time   NA 142 11/01/2022 1053   NA 144 01/10/2021  1445   K 4.0 11/01/2022 1053   CL 101 11/01/2022 1053   CO2 33 (H) 11/01/2022 1053   GLUCOSE 100 (H) 11/01/2022 1053   BUN 27 (H) 11/01/2022 1053   BUN 19 01/10/2021 1445   CREATININE 0.93 11/01/2022 1053   CALCIUM 9.4 11/01/2022 1053   PROT 7.1 02/13/2022 1116   ALBUMIN 3.9 02/13/2022 1116   AST 26 02/13/2022 1116   ALT 27 02/13/2022 1116   ALKPHOS 67 02/13/2022 1116   BILITOT 0.8 02/13/2022 1116   GFRNONAA >60 07/21/2021 2026   GFRAA >60 06/23/2014 0357      Latest Ref Rng  & Units 11/01/2022   10:53 AM 02/13/2022   11:16 AM 11/15/2021    9:37 AM  BMP  Glucose 70 - 99 mg/dL 409  82  811   BUN 6 - 23 mg/dL 27  28  26    Creatinine 0.40 - 1.20 mg/dL 9.14  7.82  9.56   Sodium 135 - 145 mEq/L 142  141  141   Potassium 3.5 - 5.1 mEq/L 4.0  3.5  3.5   Chloride 96 - 112 mEq/L 101  98  96   CO2 19 - 32 mEq/L 33  36  36   Calcium 8.4 - 10.5 mg/dL 9.4  9.0  9.5        Component Value Date/Time   WBC 9.0 07/21/2021 2026   RBC 3.86 (L) 07/21/2021 2026   HGB 10.2 (L) 07/21/2021 2026   HCT 33.2 (L) 07/21/2021 2026   PLT 207 07/21/2021 2026   MCV 86.0 07/21/2021 2026   MCH 26.4 07/21/2021 2026   MCHC 30.7 07/21/2021 2026   RDW 15.1 07/21/2021 2026   LYMPHSABS 2.3 04/09/2021 0233   MONOABS 0.7 04/09/2021 0233   EOSABS 0.3 04/09/2021 0233   BASOSABS 0.0 04/09/2021 0233     Parts of this note may have been dictated using voice recognition software. There may be variances in spelling and vocabulary which are unintentional. Not all errors are proofread. Please notify the Thereasa Parkin if any discrepancies are noted or if the meaning of any statement is not clear.

## 2023-01-09 ENCOUNTER — Other Ambulatory Visit: Payer: Self-pay | Admitting: "Endocrinology

## 2023-01-09 DIAGNOSIS — E1165 Type 2 diabetes mellitus with hyperglycemia: Secondary | ICD-10-CM

## 2023-01-15 ENCOUNTER — Other Ambulatory Visit (HOSPITAL_COMMUNITY): Payer: Self-pay

## 2023-01-15 ENCOUNTER — Telehealth: Payer: Self-pay

## 2023-01-15 NOTE — Telephone Encounter (Signed)
Pharmacy Patient Advocate Encounter   Received notification from Pt Calls Messages that prior authorization for Freestyle libre 3 plus is required/requested.   Insurance verification completed.   The patient is insured through [facility] .   Per test claim: PA required; PA submitted to above mentioned insurance via CoverMyMeds Key/confirmation #/[id number] Status is pending

## 2023-01-16 ENCOUNTER — Telehealth (INDEPENDENT_AMBULATORY_CARE_PROVIDER_SITE_OTHER): Payer: Medicaid Other | Admitting: "Endocrinology

## 2023-01-16 ENCOUNTER — Other Ambulatory Visit: Payer: Self-pay

## 2023-01-16 ENCOUNTER — Encounter: Payer: Self-pay | Admitting: "Endocrinology

## 2023-01-16 DIAGNOSIS — Z7984 Long term (current) use of oral hypoglycemic drugs: Secondary | ICD-10-CM | POA: Diagnosis not present

## 2023-01-16 DIAGNOSIS — E039 Hypothyroidism, unspecified: Secondary | ICD-10-CM

## 2023-01-16 DIAGNOSIS — E1165 Type 2 diabetes mellitus with hyperglycemia: Secondary | ICD-10-CM

## 2023-01-16 DIAGNOSIS — Z794 Long term (current) use of insulin: Secondary | ICD-10-CM

## 2023-01-16 DIAGNOSIS — E782 Mixed hyperlipidemia: Secondary | ICD-10-CM | POA: Diagnosis not present

## 2023-01-16 MED ORDER — FREESTYLE LIBRE 3 PLUS SENSOR MISC
1.0000 | 3 refills | Status: DC
  Filled 2023-01-16 – 2023-01-17 (×2): qty 6, 90d supply, fill #0

## 2023-01-16 NOTE — Patient Instructions (Signed)
Will recommend the following: Stop Januvia 25 mg every day Start ozempic 0.25 mg/week U-500 140 units before break fast and 140 units before supper-30 min before meals Metformin XR 500 mg 2 pills bid Jardiance 25 mg every day

## 2023-01-16 NOTE — Progress Notes (Signed)
The patient reports they are currently: . I spent 8-9 min on this visit that was conducted on the video with the patient on the date of service. I spent an additional 10 minutes on pre- and post-visit activities on the date of service.   The patient was physically located in [place] or a state in which I am permitted to provide care. The patient and/or parent/guardian understood that s/he may incur co-pays and cost sharing, and agreed to the telemedicine visit. The visit was reasonable and appropriate under the circumstances given the patient's presentation at the time.  The patient and/or parent/guardian has been advised of the potential risks and limitations of this mode of treatment (including, but not limited to, the absence of in-person examination) and has agreed to be treated using telemedicine. The patient's/patient's family's questions regarding telemedicine have been answered.   The patient and/or parent/guardian has also been advised to contact their provider's office for worsening conditions, and seek emergency medical treatment and/or call 911 if the patient deems either necessary.     Outpatient Endocrinology Note [name] Varna, MD  01/16/23   [name] [birth year] [id number]  Referring Provider: Julien Girt, PA* Primary Care Provider: Julien Girt, PA-C Reason for consultation: Subjective   Assessment & Plan  Diagnoses and all orders for this visit:  Uncontrolled type 2 diabetes mellitus with hyperglycemia, with long-term current use of insulin (HCC)  Long term (current) use of oral hypoglycemic drugs  Long-term insulin use (HCC)  Mixed hyperlipidemia  Acquired hypothyroidism  Type 2 diabetes mellitus with hyperglycemia, with long-term current use of insulin (HCC) -     Continuous Glucose Sensor (FREESTYLE LIBRE 3 PLUS SENSOR) MISC; Inject 1 Device into the skin continuous. 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, neuropathy, MI  Lab Results  Component Value Date   GFR 71.22 11/01/2022   Hba1c goal less than 7, current Hba1c is  Lab Results  Component Value Date   HGBA1C 9.5 (A) 02/13/2022   Will recommend the following: Stop Januvia 25 mg every day Start ozempic 0.25 mg/week U-500 140 units before break fast and 140 units before supper-30 min before meals Metformin XR 500 mg 2 pills bid Jardiance 25 mg every day   No known contraindications/side effects to any of above medications No history of MEN syndrome/medullary thyroid cancer/pancreatitis or pancreatic cancer in self or family Had a long discussion with the patient about GLP-1, patient was afraid of the side effects, discussed at length.  Patient is interested in trying it  -Last LD and Tg are as follows: Lab Results  Component Value Date   LDLCALC 48 05/31/2021    Lab Results  Component Value Date   TRIG 175.0 (H) 05/31/2021   -On rosuvastatin 40 mg QD -Follow low fat diet and exercise   -Blood pressure goal <140/90 - Microalbumin/creatinine goal is < 30 -Last MA/Cr is as follows: Lab Results  Component Value Date   MICROALBUR 12.2 (H) 08/21/2021   -not on ACE/ARB  -diet changes including salt restriction -limit eating outside -counseled BP targets per standards of diabetes care -uncontrolled blood pressure can lead to retinopathy, nephropathy and cardiovascular and atherosclerotic heart disease  Reviewed and counseled on: -A1C target -Blood sugar targets -Complications of uncontrolled diabetes  -Checking blood sugar before meals and bedtime and bring log next visit -All medications with mechanism of action and side  effects -Hypoglycemia management: rule of 15's, Glucagon Emergency Kit and medical alert ID -low-carb low-fat plate-method diet -At least 20 minutes of physical activity per day -Annual dilated retinal  eye exam and foot exam -compliance and follow up needs -follow up as scheduled or earlier if problem gets worse  Call if blood sugar is less than 70 or consistently above 250    Take a 15 gm snack of carbohydrate at bedtime before you go to sleep if your blood sugar is less than 100.    If you are going to fast after midnight for a test or procedure, ask your physician for instructions on how to reduce/decrease your insulin dose.    Call if blood sugar is less than 70 or consistently above 250  -Treating a low sugar by rule of 15  (15 gms of sugar every 15 min until sugar is more than 70) If you feel your sugar is low, test your sugar to be sure If your sugar is low (less than 70), then take 15 grams of a fast acting Carbohydrate (3-4 glucose tablets or glucose gel or 4 ounces of juice or regular soda) Recheck your sugar 15 min after treating low to make sure it is more than 70 If sugar is still less than 70, treat again with 15 grams of carbohydrate          Don't drive the hour of hypoglycemia  If unconscious/unable to eat or drink by mouth, use glucagon injection or nasal spray baqsimi and call 911. Can repeat again in 15 min if still unconscious.  Return in about 4 weeks (around 02/13/2023).   I have reviewed current medications, nurse's notes, allergies, vital signs, past medical and surgical history, family medical history, and social history for this encounter. Counseled patient on symptoms, examination findings, lab findings, imaging results, treatment decisions and monitoring and prognosis. The patient understood the recommendations and agrees with the treatment plan. All questions regarding treatment plan were fully answered.  [name] Westover, MD  01/16/23   History of Present Illness [name] is a 52 y.o. year old female who presents for follow up of Type II diabetes mellitus.  [name] was first diagnosed in age 33, 23.    Home diabetes regimen: U-500 130  units before break fast and 130 units before supper-30 min before meals Januvia 25 mg every day Metformin XR 500 mg 2 pills bid Jardiance 25 mg every day   Stopped Humulin 70/30 150 units before dinner and 130 units before dinner  COMPLICATIONS +  MI/Stroke +  retinopathy +  neuropathy -  nephropathy  BLOOD SUGAR DATA No sensor today Checks bid before u500 insulins Range 225-308   Physical Exam  There were no vitals taken for this visit.   Constitutional: well developed, well nourished Head: normocephalic, atraumatic Eyes: sclera anicteric, no redness Neck: supple Lungs: normal respiratory effort Neurology: alert and oriented Skin: dry, no appreciable rashes Musculoskeletal: no appreciable defects Psychiatric: normal mood and affect Diabetic Foot Exam - Simple   No data filed      Current Medications Patient's Medications  New Prescriptions   No medications on file  Previous Medications   ACETAMINOPHEN (TYLENOL) 500 MG TABLET    Take 1,000 mg by mouth every 6 (six) hours as needed for moderate pain or headache.   ASPIRIN EC 81 MG TABLET    Take 81 mg by mouth daily. Swallow whole.   B COMPLEX-C (SUPER B COMPLEX PO)    Take  1 tablet by mouth daily.   CHOLECALCIFEROL (VITAMIN D3) 50 MCG (2000 UT) CAPSULE    Take 2,000 Units by mouth daily.   CONTINUOUS GLUCOSE SENSOR (FREESTYLE LIBRE 3 SENSOR) MISC    APPLY 1 SENSOR ON UPPER ARM EVERY 14 DAYS FOR CONTINUOUS GLUCOSE MONITORING   DULOXETINE (CYMBALTA) 30 MG CAPSULE    Take 30 mg by mouth daily.   EMPAGLIFLOZIN (JARDIANCE) 25 MG TABS TABLET    Take 1 tablet (25 mg total) by mouth daily before breakfast.   FUROSEMIDE (LASIX) 40 MG TABLET    Take 1 tablet by mouth once daily   GABAPENTIN (NEURONTIN) 300 MG CAPSULE    Take 2 capsules by mouth 3 (three) times daily.   INSULIN REGULAR HUMAN CONCENTRATED (HUMULIN R U-500 KWIKPEN) 500 UNIT/ML KWIKPEN    Inject 80 Units into the skin 2 (two) times daily with a meal.   INSULIN  SYRINGES, DISPOSABLE, U-100 1 ML MISC    Use to inject insulin daily   LEVOTHYROXINE (SYNTHROID, LEVOTHROID) 50 MCG TABLET    Take 50 mcg by mouth daily.   METFORMIN (GLUCOPHAGE-XR) 500 MG 24 HR TABLET    TAKE 4 TABLETS BY MOUTH WITH BREAKFAST   METOPROLOL SUCCINATE (TOPROL-XL) 50 MG 24 HR TABLET    Take 1 tablet by mouth once daily   NYSTATIN CREAM (MYCOSTATIN)    Apply 1 application  topically 2 (two) times daily. Abdominal folds   OMEPRAZOLE (PRILOSEC) 40 MG CAPSULE    Take 40 mg by mouth 2 (two) times daily.   POTASSIUM CHLORIDE (KLOR-CON) 10 MEQ TABLET    Take 2 tablets by mouth once daily   RELION PEN NEEDLES 31G X 6 MM MISC    USE 1  TWICE DAILY   ROSUVASTATIN (CRESTOR) 40 MG TABLET    Take 40 mg by mouth daily.   SEMAGLUTIDE,0.25 OR 0.5MG /DOS, 2 MG/3ML SOPN    Inject 0.25 mg into the skin once a week.   SENNOSIDES 17.2 MG TABS    Take 34.4 mg by mouth 3 (three) times a week. No set days  Modified Medications   Modified Medication Previous Medication   CONTINUOUS GLUCOSE SENSOR (FREESTYLE LIBRE 3 PLUS SENSOR) MISC Continuous Glucose Sensor (FREESTYLE LIBRE 3 PLUS SENSOR) MISC      Inject 1 Device into the skin continuous. Change every 15 days    Inject 1 Device into the skin continuous. Change every 15 days  Discontinued Medications   No medications on file    Allergies Allergies  Allergen Reactions   Irbesartan Other (See Comments)    Dizziness, muscle pain   Clindamycin/Lincomycin Other (See Comments) and Nausea And Vomiting    Stomach pain    Latex Rash   Ultram [Tramadol] Rash    Past Medical History Past Medical History:  Diagnosis Date   CAD (coronary artery disease)    a. s/p NSTEMI, LHC 03/2020 dRCA 25%, m-LAD-1 95%-0% (DES), mLAD-2 50%, LVEDP moderately elevated, 27 mmHg.   Chronic diastolic heart failure (HCC)    a. LHC/echo 3/22 c/ elevated LVEDP, EF 50-55%, hypokinesis; b. Echo 10/22 EF 60-65%, mild LVH, borderline dilatation of ascending aorta, 39mm.   DDD  (degenerative disc disease), lumbar    Diabetes mellitus without complication (HCC)    GERD (gastroesophageal reflux disease)    Hyperlipidemia    Hypertension    Hypothyroidism    Lumbar herniated disc    Major depressive disorder    Morbid obesity (HCC)     Past  Surgical History Past Surgical History:  Procedure Laterality Date   CORONARY STENT INTERVENTION N/A 03/28/2020   Procedure: CORONARY STENT INTERVENTION;  Surgeon: Corky Crafts, MD;  Location: [facility] INVASIVE CV LAB;  Service: Cardiovascular;  Laterality: N/A;   DILATION AND CURETTAGE OF UTERUS     INCISION AND DRAINAGE PERIRECTAL ABSCESS  06/21/2014   INCISION AND DRAINAGE PERIRECTAL ABSCESS Bilateral 06/21/2014   Procedure: IRRIGATION AND DEBRIDEMENT PERIRECTAL ABSCESS;  Surgeon: Abigail Miyamoto, MD;  Location: [facility] OR;  Service: General;  Laterality: Bilateral;   LEFT HEART CATH AND CORONARY ANGIOGRAPHY N/A 03/28/2020   Procedure: LEFT HEART CATH AND CORONARY ANGIOGRAPHY;  Surgeon: Corky Crafts, MD;  Location: [facility] INVASIVE CV LAB;  Service: Cardiovascular;  Laterality: N/A;    Family History family history includes Diabetes in her father and mother.  Social History Social History   Socioeconomic History   Marital status: Single    Spouse name: Not on file   Number of children: Not on file   Years of education: Not on file   Highest education level: Not on file  Occupational History   Not on file  Tobacco Use   Smoking status: Never    Passive exposure: Past   Smokeless tobacco: Never  Vaping Use   Vaping status: Never Used  Substance and Sexual Activity   Alcohol use: No   Drug use: No   Sexual activity: Not on file  Other Topics Concern   Not on file  Social History Narrative   Not on file   Social Drivers of Health   Financial Resource Strain: High Risk (06/21/2022)   Received from [facility], [facility]   Overall Financial Resource Strain (CARDIA)    Difficulty of Paying Living  Expenses: Very hard  Food Insecurity: Food Insecurity Present (06/21/2022)   Received from [facility], [facility]   Hunger Vital Sign    Worried About Running Out of Food in the Last Year: Often true    Ran Out of Food in the Last Year: Often true  Transportation Needs: No Transportation Needs (06/21/2022)   Received from [employer], [facility]   PRAPARE - Transportation    Lack of Transportation (Medical): No    Lack of Transportation (Non-Medical): No  Physical Activity: Inactive (03/29/2020)   Exercise Vital Sign    Days of Exercise per Week: 0 days    Minutes of Exercise per Session: 0 min  Stress: Not on file  Social Connections: Unknown (05/29/2021)   Received from [facility], [facility]   Social Network    Social Network: Not on file  Intimate Partner Violence: Unknown (04/30/2021)   Received from [facility], [facility]   HITS    Physically Hurt: Not on file    Insult or Talk Down To: Not on file    Threaten Physical Harm: Not on file    Scream or Curse: Not on file    Lab Results  Component Value Date   HGBA1C 9.5 (A) 02/13/2022   HGBA1C 7.2 (A) 05/31/2021   HGBA1C 9.2 (A) 02/27/2021   Lab Results  Component Value Date   CHOL 121 05/31/2021   Lab Results  Component Value Date   HDL 38.60 (L) 05/31/2021   Lab Results  Component Value Date   LDLCALC 48 05/31/2021   Lab Results  Component Value Date   TRIG 175.0 (H) 05/31/2021   Lab Results  Component Value Date   CHOLHDL 3 05/31/2021   Lab Results  Component Value Date   CREATININE 0.93 11/01/2022   Lab Results  Component Value Date   GFR 71.22 11/01/2022   Lab Results  Component Value Date   MICROALBUR 12.2 (H) 08/21/2021      Component Value Date/Time   NA 142 11/01/2022 1053   NA 144 01/10/2021 1445   K 4.0 11/01/2022 1053   CL 101 11/01/2022 1053   CO2 33 (H) 11/01/2022 1053   GLUCOSE 100 (H) 11/01/2022 1053   BUN 27 (H) 11/01/2022 1053   BUN 19 01/10/2021 1445    CREATININE 0.93 11/01/2022 1053   CALCIUM 9.4 11/01/2022 1053   PROT 7.1 02/13/2022 1116   ALBUMIN 3.9 02/13/2022 1116   AST 26 02/13/2022 1116   ALT 27 02/13/2022 1116   ALKPHOS 67 02/13/2022 1116   BILITOT 0.8 02/13/2022 1116   GFRNONAA >60 07/21/2021 2026   GFRAA >60 06/23/2014 0357      Latest Ref Rng & Units 11/01/2022   10:53 AM 02/13/2022   11:16 AM 11/15/2021    9:37 AM  BMP  Glucose 70 - 99 mg/dL 540  82  981   BUN 6 - 23 mg/dL 27  28  26    Creatinine 0.40 - 1.20 mg/dL 1.91  4.78  2.95   Sodium 135 - 145 mEq/L 142  141  141   Potassium 3.5 - 5.1 mEq/L 4.0  3.5  3.5   Chloride 96 - 112 mEq/L 101  98  96   CO2 19 - 32 mEq/L 33  36  36   Calcium 8.4 - 10.5 mg/dL 9.4  9.0  9.5        Component Value Date/Time   WBC 9.0 07/21/2021 2026   RBC 3.86 (L) 07/21/2021 2026   HGB 10.2 (L) 07/21/2021 2026   HCT 33.2 (L) 07/21/2021 2026   PLT 207 07/21/2021 2026   MCV 86.0 07/21/2021 2026   MCH 26.4 07/21/2021 2026   MCHC 30.7 07/21/2021 2026   RDW 15.1 07/21/2021 2026   LYMPHSABS 2.3 04/09/2021 0233   MONOABS 0.7 04/09/2021 0233   EOSABS 0.3 04/09/2021 0233   BASOSABS 0.0 04/09/2021 0233     Parts of this note may have been dictated using voice recognition software. There may be variances in spelling and vocabulary which are unintentional. Not all errors are proofread. Please notify the Thereasa Parkin if any discrepancies are noted or if the meaning of any statement is not clear.

## 2023-01-17 ENCOUNTER — Other Ambulatory Visit: Payer: Self-pay

## 2023-01-17 NOTE — Telephone Encounter (Signed)
Pharmacy Patient Advocate Encounter  Received notification from [facility] that Prior Authorization for [facility] plus has been APPROVED through 07/16/2023   PA #/Case ID/Reference #: [id number]

## 2023-01-23 NOTE — Progress Notes (Deleted)
 HPI: Follow-up coronary artery disease, congestive heart failure and hypertension.  Had non-ST elevation myocardial infarction March 2022.  Cardiac catheterization March 2022 showed 95% mid LAD which was treated with drug-eluting stent and left ventricular end-diastolic pressure of 27 mmHg.  Echocardiogram October 2022 showed normal LV function, mild left ventricular enlargement, mild left ventricular hypertrophy. Since last seen,   Current Outpatient Medications  Medication Sig Dispense Refill   acetaminophen (TYLENOL) 500 MG tablet Take 1,000 mg by mouth every 6 (six) hours as needed for moderate pain or headache.     aspirin EC 81 MG tablet Take 81 mg by mouth daily. Swallow whole.     B Complex-C (SUPER B COMPLEX PO) Take 1 tablet by mouth daily.     Cholecalciferol (VITAMIN D3) 50 MCG (2000 UT) capsule Take 2,000 Units by mouth daily.     Continuous Glucose Sensor (FREESTYLE LIBRE 3 PLUS SENSOR) MISC Inject 1 Device into the skin continuous. Change every 15 days 6 each 3   Continuous Glucose Sensor (FREESTYLE LIBRE 3 SENSOR) MISC APPLY 1 SENSOR ON UPPER ARM EVERY 14 DAYS FOR CONTINUOUS GLUCOSE MONITORING 2 each 3   DULoxetine (CYMBALTA) 30 MG capsule Take 30 mg by mouth daily.     empagliflozin (JARDIANCE) 25 MG TABS tablet Take 1 tablet (25 mg total) by mouth daily before breakfast. 90 tablet 1   furosemide (LASIX) 40 MG tablet Take 1 tablet by mouth once daily 90 tablet 0   gabapentin (NEURONTIN) 300 MG capsule Take 2 capsules by mouth 3 (three) times daily.     insulin regular human CONCENTRATED (HUMULIN R U-500 KWIKPEN) 500 UNIT/ML KwikPen Inject 80 Units into the skin 2 (two) times daily with a meal. 12 mL 2   Insulin Syringes, Disposable, U-100 1 ML MISC Use to inject insulin daily 100 each 3   levothyroxine (SYNTHROID, LEVOTHROID) 50 MCG tablet Take 50 mcg by mouth daily.  3   metFORMIN (GLUCOPHAGE-XR) 500 MG 24 hr tablet TAKE 4 TABLETS BY MOUTH WITH BREAKFAST 360 tablet 0    metoprolol succinate (TOPROL-XL) 50 MG 24 hr tablet Take 1 tablet by mouth once daily 90 tablet 3   nystatin cream (MYCOSTATIN) Apply 1 application  topically 2 (two) times daily. Abdominal folds     omeprazole (PRILOSEC) 40 MG capsule Take 40 mg by mouth 2 (two) times daily.     potassium chloride (KLOR-CON) 10 MEQ tablet Take 2 tablets by mouth once daily 60 tablet 1   RELION PEN NEEDLES 31G X 6 MM MISC USE 1  TWICE DAILY 100 each 0   rosuvastatin (CRESTOR) 40 MG tablet Take 40 mg by mouth daily.     Semaglutide,0.25 or 0.5MG /DOS, 2 MG/3ML SOPN Inject 0.25 mg into the skin once a week. 3 mL 1   Sennosides 17.2 MG TABS Take 34.4 mg by mouth 3 (three) times a week. No set days     No current facility-administered medications for this visit.     Past Medical History:  Diagnosis Date   CAD (coronary artery disease)    a. s/p NSTEMI, LHC 03/2020 dRCA 25%, m-LAD-1 95%-0% (DES), mLAD-2 50%, LVEDP moderately elevated, 27 mmHg.   Chronic diastolic heart failure (HCC)    a. LHC/echo 3/22 c/ elevated LVEDP, EF 50-55%, hypokinesis; b. Echo 10/22 EF 60-65%, mild LVH, borderline dilatation of ascending aorta, 39mm.   DDD (degenerative disc disease), lumbar    Diabetes mellitus without complication (HCC)    GERD (gastroesophageal reflux  disease)    Hyperlipidemia    Hypertension    Hypothyroidism    Lumbar herniated disc    Major depressive disorder    Morbid obesity (HCC)     Past Surgical History:  Procedure Laterality Date   CORONARY STENT INTERVENTION N/A 03/28/2020   Procedure: CORONARY STENT INTERVENTION;  Surgeon: Corky Crafts, MD;  Location: [facility] INVASIVE CV LAB;  Service: Cardiovascular;  Laterality: N/A;   DILATION AND CURETTAGE OF UTERUS     INCISION AND DRAINAGE PERIRECTAL ABSCESS  06/21/2014   INCISION AND DRAINAGE PERIRECTAL ABSCESS Bilateral 06/21/2014   Procedure: IRRIGATION AND DEBRIDEMENT PERIRECTAL ABSCESS;  Surgeon: Abigail Miyamoto, MD;  Location: [facility] OR;  Service:  General;  Laterality: Bilateral;   LEFT HEART CATH AND CORONARY ANGIOGRAPHY N/A 03/28/2020   Procedure: LEFT HEART CATH AND CORONARY ANGIOGRAPHY;  Surgeon: Corky Crafts, MD;  Location: [facility] INVASIVE CV LAB;  Service: Cardiovascular;  Laterality: N/A;    Social History   Socioeconomic History   Marital status: Single    Spouse name: Not on file   Number of children: Not on file   Years of education: Not on file   Highest education level: Not on file  Occupational History   Not on file  Tobacco Use   Smoking status: Never    Passive exposure: Past   Smokeless tobacco: Never  Vaping Use   Vaping status: Never Used  Substance and Sexual Activity   Alcohol use: No   Drug use: No   Sexual activity: Not on file  Other Topics Concern   Not on file  Social History Narrative   Not on file   Social Drivers of Health   Financial Resource Strain: High Risk (06/21/2022)   [facility], [facility]   Overall Financial Resource Strain (CARDIA)    Difficulty of Paying Living Expenses: Very hard  Food Insecurity: Food Insecurity Present (06/21/2022)   Received from [facility], [facility]   Hunger Vital Sign    Worried About Running Out of Food in the Last Year: Often true    Ran Out of Food in the Last Year: Often true  Transportation Needs: No Transportation Needs (06/21/2022)   Received from [employer], [facility]   PRAPARE - Transportation    Lack of Transportation (Medical): No    Lack of Transportation (Non-Medical): No  Physical Activity: Inactive (03/29/2020)   Exercise Vital Sign    Days of Exercise per Week: 0 days    Minutes of Exercise per Session: 0 min  Stress: Not on file  Social Connections: Unknown (05/29/2021)   Received from [facility], [facility]   Social Network    Social Network: Not on file  Intimate Partner Violence: Unknown (04/30/2021)   Received from [facility], [facility]   HITS    Physically Hurt: Not on file     Insult or Talk Down To: Not on file    Threaten Physical Harm: Not on file    Scream or Curse: Not on file    Family History  Problem Relation Age of Onset   Diabetes Mother    Diabetes Father    Heart disease Neg Hx     ROS: no fevers or chills, productive cough, hemoptysis, dysphasia, odynophagia, melena, hematochezia, dysuria, hematuria, rash, seizure activity, orthopnea, PND, pedal edema, claudication. Remaining systems are negative.  Physical Exam: Well-developed well-nourished in no acute distress.  Skin is warm and dry.  HEENT is normal.  Neck is  supple.  Chest is clear to auscultation with normal expansion.  Cardiovascular exam is regular rate and rhythm.  Abdominal exam nontender or distended. No masses palpated. Extremities show no edema. neuro grossly intact  ECG- personally reviewed  A/P  1 coronary artery disease-plan to continue aspirin and statin.  She denies chest pain.  2 chronic diastolic congestive heart failure-patient appears to be reasonably well compensated.  Will continue diuretic at present dose.  3 hypertension-blood pressure controlled.  Continue present medications.  4 hyperlipidemia-continue Crestor.  5 morbid obesity-we discussed the importance of weight loss.  6 chronic dyspnea/respiratory failure-continue home oxygen.  Pulmonary following.  Continue BiPAP for obstructive sleep apnea.  [name], MD

## 2023-01-28 ENCOUNTER — Other Ambulatory Visit: Payer: Self-pay

## 2023-02-04 ENCOUNTER — Ambulatory Visit: Payer: Medicaid Other | Admitting: Cardiology

## 2023-02-05 ENCOUNTER — Other Ambulatory Visit: Payer: Self-pay | Admitting: "Endocrinology

## 2023-02-05 DIAGNOSIS — E1165 Type 2 diabetes mellitus with hyperglycemia: Secondary | ICD-10-CM

## 2023-02-06 ENCOUNTER — Other Ambulatory Visit: Payer: Self-pay | Admitting: Cardiology

## 2023-02-17 NOTE — Progress Notes (Deleted)
Cardiology Office Note:    Date:  02/17/2023   ID:  [name], DOB 10/18/1970, MRN [id number]  PCP:  Julien Girt, PA-C   [facility] Providers Cardiologist:  [name], MD      Referring MD: Elizabeth Palau, FNP   Follow-up evaluation of her acute on chronic diastolic CHF, and CAD.  History of Present Illness:    [name] is a 53 y.o. female with a hx of hypertension, GERD, depression, hypothyroidism, morbid obesity, uncontrolled type 2 diabetes, and chronic diastolic CHF.   She was admitted to hospital with chest discomfort and on 03/28/2020.  Her high-sensitivity troponins were elevated at 514 and climbed to over 2000.  Her EKG showed nonspecific changes.  She was diagnosed with NSTEMI and taken to the Cath Lab.  Cardiac catheterization showed 25% distal RCA, mid LAD 95%, mid LAD-to 50%, moderately elevated LVEDP at 27 mmHg.  She received PCI with DES x1 to her mid LAD.  Her echocardiogram at that time showed normal LVEF, septal/apical hypokinesis, normal diastolic function, no valvular abnormalities and normal right ventricular function.     She followed up with heart and vascular transition of care clinic 04/06/2020.  At that time she reported she felt achy since being discharged from the hospital.  She denied chest pain except with deep inhalation.  Her discomfort resolved with exhalation.  She reported overall feeling better.  Her medications were reviewed.  It was noted that her pillbox was not in order, containing days with missing doses of certain medications and multiple doses on other days.  She reported having very poor vision and having difficulty with her pill bottles.   She presented to the clinic 06/06/20 for follow-up evaluation stated she felt fairly well.  She did report that she had increased anxiety related to doctors visits and medical procedures.  She ha noticed occasional brief episodes of sharp chest pain.  She reported that the pain could  be reproduced with pressing over the area.  I educated her that this was related to muscle wall pain.  She expressed understanding.  She reported that she did have occasional brief episodes of palpitations.  She reported that these would happen a couple times a week.  She also reported that she had presented to the emergency department with complaints of lower extremity swelling.  She was instructed to take extra doses of her furosemide.  She reported that she followed a low-sodium diet and restricted her fluid.  She was breathing much better with 2 L of oxygen via nasal cannula.  We gave  her the [place] support stocking sheet, salty 6 diet sheet, have her increase her physical activity as tolerated, continue current medication regimen and order a BMP.   Follow-up was planned for 6 months with Dr. Jens Som.  She presented to the clinic 10/18/20 for follow-up evaluation stated she had noticed that she was more fatigued over the last several months.  She noticed that she continued to need her oxygen.  She reported that when she was asleep at night and her oxygen would fall off her saturation will drop into the 70s.   She was maintaining a heart healthy low-sodium diet.  She reported that she did have some lower extremity numbness and that she had a herniated lumbar disc.  She continued to have some right-sided/shoulder area discomfort as well as left-sided shoulder discomfort.  Appeared to be related to deconditioning and musculoskeletal pain related to the use of her walker.  Her weight  had increased about 10-11 pounds.  She denied  bleeding issues.  She performed a walking test without her oxygen and her oxygen saturation dropped to 87 after only ambulating 10-12 feet.  I ordered a sleep study, sent my note to her insurance company so that she may maintain her oxygen therapy, asked to her increase her physical activity, repeated an echocardiogram, and planned follow-up after her sleep study.  We reviewed the  importance of weight loss and increasing physical activity.  I recommended that she start doing chair type exercises multiple times per day.  She was seen in follow-up by Dr. Jens Som on 03/28/2021.  Her echocardiogram 10/22 showed normal LV function, mid left ventricular enlargement, mild left ventricular hypertrophy.  She reported multiple complaints.  She noted dyspnea on exertion.  She described bilateral lower extremity edema which was worse with her legs down.  She noted occasional pain in her right chest that was unlike her previous infarct pain.  She denied syncope.  Her Lasix was continued.  She was instructed to follow-up with pulmonology for her chronic dyspnea/respiratory failure and for possible sleep apnea.  Her rosuvastatin was increased to 40 mg daily with plan to repeat lipids and LFTs in 8 weeks.  She was seen in the emergency department on 07/22/2021.  She complained of shortness of breath and chest discomfort.  She was also noted to have increased lower extremity fluid.  She was wearing 2.5 L nasal cannula and denied fever and chills.  She reported a cough x5 days which was nonproductive.  Her chest x-ray showed no acute cardiopulmonary disease.  Her high-sensitivity troponins were low and flat.  Her BNP was 50.  She received IV Lasix in the emergency department and urinated around 2 L.  She reported that her breathing was significantly improved.  She was advised to continue her home furosemide.  She presented to the clinic 08/20/21 for follow-up evaluation stated she continued to use 2 L of oxygen while she was away from home and 2-1/2 L of oxygen at home.  She reported intermittent periods of shortness of breath with laying back.  We reviewed her most recent emergency department visit.  She expressed understanding.  She remained limited in her physical activity due to back pain.  She reported that she was trying to wean oxygen but was unable to titrate down.  We reviewed her echocardiogram  and her pumping function.  She continued to be compliant with fluid restriction.  We reviewed the importance of daily weights and contacting the office with a weight increase of 2 to 3 pounds overnight or 5 pounds in 1 week.  I refered her to pulmonology for evaluation of her lungs and recommendations for coming off oxygen/treatment.  I reviewed the importance of lower extremity support stockings, she has been tolerating lower extremity wraps and is unable to manage support stockings.  I planned follow-up in 6 months.  She was seen by Dr. Isaiah Serge 04/19/2022.  She has been diagnosed with chronic respiratory failure with hypoxia.  She was continued on 2 L of oxygen.  It was felt that her chronic hypoxic respiratory failure is due to obesity and CHF.  She underwent sleep study which showed severe OSA with moderate oxygen desaturation ration.  It was felt this was secondary to obesity hypoventilation syndrome.  BiPAP titration study was recommended.    She presented to the clinic 06/21/22 for follow-up evaluation and stated she continued to see pulmonology and endocrinology.  She reported was given  samples of Ozempic.  We discussed the medication.  I recommended that she start the medication to help with her comorbidities and weight loss.  She was on 3 L nasal cannula.  Her weight was up to 377 pounds.  She had bilateral lower extremity 1+ pitting edema.  Lungs were clear to auscultation.  She reported compliance with her medications.  I  refered to wound care for evaluation and recommend bilateral Unna boots.  I  ordered fasting lipids and LFTs and planned follow-up in 6 months.  She presents to the clinic today for follow-up evaluation and states***.   Today she denies chest pain, increased shortness of breath, increased lower extremity edema, fatigue, palpitations, melena, hematuria, hemoptysis, diaphoresis, weakness, presyncope, syncope, orthopnea, and PND.    Past Medical History:  Diagnosis Date   CAD  (coronary artery disease)    a. s/p NSTEMI, LHC 03/2020 dRCA 25%, m-LAD-1 95%-0% (DES), mLAD-2 50%, LVEDP moderately elevated, 27 mmHg.   Chronic diastolic heart failure (HCC)    a. LHC/echo 3/22 c/ elevated LVEDP, EF 50-55%, hypokinesis; b. Echo 10/22 EF 60-65%, mild LVH, borderline dilatation of ascending aorta, 39mm.   DDD (degenerative disc disease), lumbar    Diabetes mellitus without complication (HCC)    GERD (gastroesophageal reflux disease)    Hyperlipidemia    Hypertension    Hypothyroidism    Lumbar herniated disc    Major depressive disorder    Morbid obesity (HCC)     Past Surgical History:  Procedure Laterality Date   CORONARY STENT INTERVENTION N/A 03/28/2020   Procedure: CORONARY STENT INTERVENTION;  Surgeon: Corky Crafts, MD;  Location: [facility] INVASIVE CV LAB;  Service: Cardiovascular;  Laterality: N/A;   DILATION AND CURETTAGE OF UTERUS     INCISION AND DRAINAGE PERIRECTAL ABSCESS  06/21/2014   INCISION AND DRAINAGE PERIRECTAL ABSCESS Bilateral 06/21/2014   Procedure: IRRIGATION AND DEBRIDEMENT PERIRECTAL ABSCESS;  Surgeon: Abigail Miyamoto, MD;  Location: [facility] OR;  Service: General;  Laterality: Bilateral;   LEFT HEART CATH AND CORONARY ANGIOGRAPHY N/A 03/28/2020   Procedure: LEFT HEART CATH AND CORONARY ANGIOGRAPHY;  Surgeon: Corky Crafts, MD;  Location: [facility] INVASIVE CV LAB;  Service: Cardiovascular;  Laterality: N/A;    Current Medications: No outpatient medications have been marked as taking for the 02/19/23 encounter (Appointment) with Ronney Asters, NP.     Allergies:   Irbesartan, Clindamycin/lincomycin, Latex, and Ultram [tramadol]   Social History   Socioeconomic History   Marital status: Single    Spouse name: Not on file   Number of children: Not on file   Years of education: Not on file   Highest education level: Not on file  Occupational History   Not on file  Tobacco Use   Smoking status: Never    Passive exposure: Past   Smokeless  tobacco: Never  Vaping Use   Vaping status: Never Used  Substance and Sexual Activity   Alcohol use: No   Drug use: No   Sexual activity: Not on file  Other Topics Concern   Not on file  Social History Narrative   Not on file   Social Drivers of Health   Financial Resource Strain: High Risk (06/21/2022)   Received from [facility], [facility]   Overall Financial Resource Strain (CARDIA)    Difficulty of Paying Living Expenses: Very hard  Food Insecurity: Food Insecurity Present (06/21/2022)   Received from [facility], [facility]   Hunger Vital Sign    Worried About  Running Out of Food in the Last Year: Often true    Ran Out of Food in the Last Year: Often true  Transportation Needs: No Transportation Needs (06/21/2022)   Received from [facility], [facility]   PRAPARE - Transportation    Lack of Transportation (Medical): No    Lack of Transportation (Non-Medical): No  Physical Activity: Inactive (03/29/2020)   Exercise Vital Sign    Days of Exercise per Week: 0 days    Minutes of Exercise per Session: 0 min  Stress: Not on file  Social Connections: Unknown (05/29/2021)   Received from [facility], [facility]   Social Network    Social Network: Not on file     Family History: The patient's family history includes Diabetes in her father and mother. There is no history of Heart disease.  ROS:   Please see the history of present illness.     All other systems reviewed and are negative.   Risk Assessment/Calculations:           Physical Exam:    VS:  There were no vitals taken for this visit.    Wt Readings from Last 3 Encounters:  11/01/22 (!) 394 lb 12.8 oz (179.1 kg)  06/21/22 (!) 377 lb (171 kg)  04/19/22 (!) 369 lb 9.6 oz (167.6 kg)     GEN:  Well nourished, well developed in no acute distress HEENT: Normal NECK: No JVD; No carotid bruits LYMPHATICS: No lymphadenopathy CARDIAC: RRR, no murmurs, rubs, gallops bilateral lower extre**mity  1+ pitting edema RESPIRATORY:  Clear to auscultation without rales, wheezing or rhonchi  ABDOMEN: Soft, non-tender, non-distended MUSCULOSKELETAL:  No edema; No deformity  SKIN: Warm and dry.  Left lower extremit***y intact blisters.  No erythema NEUROLOGIC:  Alert and oriented x 3 PSYCHIATRIC:  Normal affect    EKGs/Labs/Other Studies Reviewed:    EKG 06/21/2022.  Shows normal sinus rhythm right bundle branch block left anterior fascicular block 68 bpm.  The following studies were reviewed today:  Echocardiogram 03/28/2020 IMPRESSIONS     1. Overall poor image quality even with definity Septal and apical  hypokinesis . Left ventricular ejection fraction, by estimation, is 50 to  55%. The left ventricle has low normal function. The left ventricle has no  regional wall motion abnormalities.  Left ventricular diastolic parameters were normal.   2. Right ventricular systolic function is normal. The right ventricular  size is normal.   3. Left atrial size was mildly dilated.   4. The mitral valve is normal in structure. No evidence of mitral valve  regurgitation. No evidence of mitral stenosis.   5. The aortic valve was not well visualized. Aortic valve regurgitation  is not visualized. No aortic stenosis is present.   6. The inferior vena cava is normal in size with greater than 50%  respiratory variability, suggesting right atrial pressure of 3 mmHg.   Echocardiogram 11/10/2020  IMPRESSIONS     1. Left ventricular ejection fraction, by estimation, is 60 to 65%. The  left ventricle has normal function. The left ventricle has no regional  wall motion abnormalities. The left ventricular internal cavity size was  mildly dilated. There is mild left  ventricular hypertrophy. Left ventricular diastolic parameters were  normal.   2. Right ventricular systolic function is normal. The right ventricular  size is normal.   3. The mitral valve is normal in structure. Trivial mitral valve   regurgitation. No evidence of mitral stenosis.   4. The aortic  valve is normal in structure. Aortic valve regurgitation is  not visualized. No aortic stenosis is present.   5. Aortic dilatation noted. There is borderline dilatation of the  ascending aorta, measuring 39 mm.   6. The inferior vena cava is normal in size with greater than 50%  respiratory variability, suggesting right atrial pressure of 3 mmHg.   Comparison(s): EF 50 %, hypokinesis.  Cardiac catheterization 03/28/2020 Dist RCA lesion is 25% stenosed. Mid LAD-1 lesion is 95% stenosed. A drug-eluting stent was successfully placed using a STENT RESOLUTE ONYX [id number]. The proximal stent was postdilated with a 3.75 mm Kress baloon. Post intervention, there is a 0% residual stenosis. Mid LAD-2 lesion is 50% stenosed. LV end diastolic pressure is moderately elevated. LVEDP 27 mm Hg. There is no aortic valve stenosis.   Continue dual antiplatelet therapy for 12 months. Consider clopidogrel monotherapy after 12 months, along with aggressive secondary prevention.     Increase statin to high potency.     Continue with plans for diuresis.  D/w Dr. Jens Som.  Diagnostic Dominance: Right     Intervention          EKG: None today.  Recent Labs: 11/01/2022: BUN 27; Creatinine, Ser 0.93; Potassium 4.0; Sodium 142; TSH 3.57  Recent Lipid Panel    Component Value Date/Time   CHOL 121 05/31/2021 1220   TRIG 175.0 (H) 05/31/2021 1220   HDL 38.60 (L) 05/31/2021 1220   CHOLHDL 3 05/31/2021 1220   VLDL 35.0 05/31/2021 1220   LDLCALC 48 05/31/2021 1220    ASSESSMENT & PLAN   Chronic diastolic CHF-weight today 37***7 pounds.   Chronic dyspnea with increased physical activity.  She was seen and evaluated by pulmonology who diagnosed chronic hypoxic respiratory failure.  Breathing stable. Continue current medical therapy Low-sodium diet-reviewed Chair exercises given Daily weights-contact office with any increase of 3 pounds  overnight or 5 pounds in 1 week-again reviewed Continue lower extremity support stockings  Coronary artery disease- continues with nonexertional chest discomfort that is atypical in nature.  Discomfort continues to appear to be musculoskeletal in nature. She had cardiac catheterization 03/28/2020 which showed LAD lesions x2.  She received mid LAD PCI and DES x1.  Details above. Continue aspirin,  rosuvastatin, metoprolol Heart healthy low-sodium diet  Fatigue/DOE-unchanged.  Continues with 2***L of oxygen.  Secondary to chronic hypoxic respiratory failure and body habitus Continue 2 L of O2 Follows with pulmonology   Essential hypertension-BP today 106/6***0. Continue metoprolol, spironolactone Maintain blood pressure log Heart healthy low-sodium diet   Hyperlipidemia-LDL 48 on 05/31/21.  Continue rosuvastatin Heart healthy low-sodium high-fiber diet Increase physical activity as tolerated Repeat fasting lipids and LFTs   Type 2 diabetes- glucose 100 on 11/01/2022.   Continue current medical therapy Follows with PCP   Morbid obesity-weight today 37***7 pounds.  Continues on semaglutide.  She is limited with her  mobility due to low back pain and lower extremity numbness. Continue weight loss Reduced calorie diet Follows with PCP   Disposition: Follow-up with Dr. Jens Som in 6-9 months.        Medication Adjustments/Labs and Tests Ordered: Current medicines are reviewed at length with the patient today.  Concerns regarding medicines are outlined above.  No orders of the defined types were placed in this encounter.  No orders of the defined types were placed in this encounter.    There are no Patient Instructions on file for this visit.   Signed, Ronney Asters, NP  02/17/2023 1:14 PM  Notice: This dictation was prepared with Dragon dictation along with smaller phrase technology. Any transcriptional errors that result from this process are unintentional and may not  be corrected upon review.  I spent 15*** minutes examining this patient, reviewing medications, and using patient centered shared decision making involving her cardiac care.  Prior to her visit I spent greater than 20 minutes reviewing her past medical history,  medications, and prior cardiac tests.

## 2023-02-19 ENCOUNTER — Ambulatory Visit: Payer: Medicaid Other | Admitting: General Practice

## 2023-02-24 ENCOUNTER — Ambulatory Visit: Payer: Medicaid Other | Admitting: Pulmonary Disease

## 2023-03-17 ENCOUNTER — Ambulatory Visit: Payer: Medicaid Other | Admitting: General Practice

## 2023-03-18 ENCOUNTER — Ambulatory Visit: Payer: Medicaid Other | Admitting: "Endocrinology

## 2023-03-20 ENCOUNTER — Ambulatory Visit: Payer: Medicaid Other | Admitting: General Practice

## 2023-03-21 ENCOUNTER — Other Ambulatory Visit: Payer: Self-pay | Admitting: "Endocrinology

## 2023-03-21 DIAGNOSIS — E1165 Type 2 diabetes mellitus with hyperglycemia: Secondary | ICD-10-CM

## 2023-03-24 NOTE — Telephone Encounter (Signed)
 Medication refill request complete

## 2023-03-28 ENCOUNTER — Ambulatory Visit: Payer: Medicaid Other | Admitting: "Endocrinology

## 2023-03-30 ENCOUNTER — Other Ambulatory Visit: Payer: Self-pay | Admitting: Cardiology

## 2023-04-04 ENCOUNTER — Encounter (HOSPITAL_BASED_OUTPATIENT_CLINIC_OR_DEPARTMENT_OTHER): Payer: Self-pay

## 2023-04-06 NOTE — Progress Notes (Deleted)
 Cardiology Clinic Note   Patient Name: [name] Date of Encounter: 04/06/2023  Primary Care Provider:  Julien Girt, PA-C Primary Cardiologist:  [name], MD  Patient Profile    [name] 53 year old female presents to the clinic today for follow-up evaluation of her chronic diastolic CHF and coronary artery disease.  Past Medical History    Past Medical History:  Diagnosis Date   CAD (coronary artery disease)    a. s/p NSTEMI, LHC 03/2020 dRCA 25%, m-LAD-1 95%-0% (DES), mLAD-2 50%, LVEDP moderately elevated, 27 mmHg.   Chronic diastolic heart failure (HCC)    a. LHC/echo 3/22 c/ elevated LVEDP, EF 50-55%, hypokinesis; b. Echo 10/22 EF 60-65%, mild LVH, borderline dilatation of ascending aorta, 39mm.   DDD (degenerative disc disease), lumbar    Diabetes mellitus without complication (HCC)    GERD (gastroesophageal reflux disease)    Hyperlipidemia    Hypertension    Hypothyroidism    Lumbar herniated disc    Major depressive disorder    Morbid obesity (HCC)    Past Surgical History:  Procedure Laterality Date   CORONARY STENT INTERVENTION N/A 03/28/2020   Procedure: CORONARY STENT INTERVENTION;  Surgeon: Corky Crafts, MD;  Location: [facility] INVASIVE CV LAB;  Service: Cardiovascular;  Laterality: N/A;   DILATION AND CURETTAGE OF UTERUS     INCISION AND DRAINAGE PERIRECTAL ABSCESS  06/21/2014   INCISION AND DRAINAGE PERIRECTAL ABSCESS Bilateral 06/21/2014   Procedure: IRRIGATION AND DEBRIDEMENT PERIRECTAL ABSCESS;  Surgeon: Abigail Miyamoto, MD;  Location: [facility] OR;  Service: General;  Laterality: Bilateral;   LEFT HEART CATH AND CORONARY ANGIOGRAPHY N/A 03/28/2020   Procedure: LEFT HEART CATH AND CORONARY ANGIOGRAPHY;  Surgeon: Corky Crafts, MD;  Location: [facility] INVASIVE CV LAB;  Service: Cardiovascular;  Laterality: N/A;    Allergies  Allergies  Allergen Reactions   Irbesartan Other (See Comments)    Dizziness, muscle pain    Clindamycin/Lincomycin Other (See Comments) and Nausea And Vomiting    Stomach pain    Latex Rash   Ultram [Tramadol] Rash    History of Present Illness    [name] has a PMH ofhypertension, GERD, depression, hypothyroidism, morbid obesity, uncontrolled type 2 diabetes, and chronic diastolic CHF.      She was admitted to hospital with chest discomfort and on 03/28/2020.  Her high-sensitivity troponins were elevated at 514 and climbed to over 2000.  Her EKG showed nonspecific changes.  She was diagnosed with NSTEMI and taken to the Cath Lab.  Cardiac catheterization showed 25% distal RCA, mid LAD 95%, mid LAD-to 50%, moderately elevated LVEDP at 27 mmHg.  She received PCI with DES x1 to her mid LAD.  Her echocardiogram at that time showed normal LVEF, septal/apical hypokinesis, normal diastolic function, no valvular abnormalities and normal right ventricular function.  She followed up with heart and vascular transition of care clinic 04/06/2020.  At that time she reported she felt achy since being discharged from the hospital.  She denied chest pain except with deep inhalation.  Her discomfort resolved with exhalation.  She reported overall feeling better.  Her medications were reviewed.  It was noted that her pillbox was not in order, containing days with missing doses of certain medications and multiple doses on other days.  She reported having very poor vision and having difficulty with her pill bottles.   She presented to the clinic 06/06/20 for follow-up evaluation stated she felt fairly well.  She did report that she had increased anxiety  related to doctors visits and medical procedures.  She ha noticed occasional brief episodes of sharp chest pain.  She reported that the pain could be reproduced with pressing over the area.  I educated her that this was related to muscle wall pain.  She expressed understanding.  She reported that she did have occasional brief episodes of palpitations.  She  reported that these would happen a couple times a week.  She also reported that she had presented to the emergency department with complaints of lower extremity swelling.  She was instructed to take extra doses of her furosemide.  She reported that she followed a low-sodium diet and restricted her fluid.  She was breathing much better with 2 L of oxygen via nasal cannula.  We gave  her the [place] support stocking sheet, salty 6 diet sheet, have her increase her physical activity as tolerated, continue current medication regimen and order a BMP.   Follow-up was planned for 6 months with Dr. Jens Som.   She presented to the clinic 10/18/20 for follow-up evaluation stated she had noticed that she was more fatigued over the last several months.  She noticed that she continued to need her oxygen.  She reported that when she was asleep at night and her oxygen would fall off her saturation will drop into the 70s.   She was maintaining a heart healthy low-sodium diet.  She reported that she did have some lower extremity numbness and that she had a herniated lumbar disc.  She continued to have some right-sided/shoulder area discomfort as well as left-sided shoulder discomfort.  Appeared to be related to deconditioning and musculoskeletal pain related to the use of her walker.  Her weight had increased about 10-11 pounds.  She denied  bleeding issues.  She performed a walking test without her oxygen and her oxygen saturation dropped to 87 after only ambulating 10-12 feet.  I ordered a sleep study, sent my note to her insurance company so that she may maintain her oxygen therapy, asked to her increase her physical activity, repeated an echocardiogram, and planned follow-up after her sleep study.  We reviewed the importance of weight loss and increasing physical activity.  I recommended that she start doing chair type exercises multiple times per day.   She was seen in follow-up by Dr. Jens Som on 03/28/2021.  Her  echocardiogram 10/22 showed normal LV function, mid left ventricular enlargement, mild left ventricular hypertrophy.  She reported multiple complaints.  She noted dyspnea on exertion.  She described bilateral lower extremity edema which was worse with her legs down.  She noted occasional pain in her right chest that was unlike her previous infarct pain.  She denied syncope.  Her Lasix was continued.  She was instructed to follow-up with pulmonology for her chronic dyspnea/respiratory failure and for possible sleep apnea.  Her rosuvastatin was increased to 40 mg daily with plan to repeat lipids and LFTs in 8 weeks.   She was seen in the emergency department on 07/22/2021.  She complained of shortness of breath and chest discomfort.  She was also noted to have increased lower extremity fluid.  She was wearing 2.5 L nasal cannula and denied fever and chills.  She reported a cough x5 days which was nonproductive.  Her chest x-ray showed no acute cardiopulmonary disease.  Her high-sensitivity troponins were low and flat.  Her BNP was 50.  She received IV Lasix in the emergency department and urinated around 2 L.  She reported that her  breathing was significantly improved.  She was advised to continue her home furosemide.   She presented to the clinic 08/20/21 for follow-up evaluation stated she continued to use 2 L of oxygen while she was away from home and 2-1/2 L of oxygen at home.  She reported intermittent periods of shortness of breath with laying back.  We reviewed her most recent emergency department visit.  She expressed understanding.  She remained limited in her physical activity due to back pain.  She reported that she was trying to wean oxygen but was unable to titrate down.  We reviewed her echocardiogram and her pumping function.  She continued to be compliant with fluid restriction.  We reviewed the importance of daily weights and contacting the office with a weight increase of 2 to 3 pounds overnight or  5 pounds in 1 week.  I refered her to pulmonology for evaluation of her lungs and recommendations for coming off oxygen/treatment.  I reviewed the importance of lower extremity support stockings, she has been tolerating lower extremity wraps and is unable to manage support stockings.  I planned follow-up in 6 months.   She was seen by Dr. Isaiah Serge 04/19/2022.  She has been diagnosed with chronic respiratory failure with hypoxia.  She was continued on 2 L of oxygen.  It was felt that her chronic hypoxic respiratory failure is due to obesity and CHF.  She underwent sleep study which showed severe OSA with moderate oxygen desaturation ration.  It was felt this was secondary to obesity hypoventilation syndrome.  BiPAP titration study was recommended.       She presented to the clinic 06/21/22 for follow-up evaluation and stated she continued to see pulmonology and endocrinology.  She reported was given samples of Ozempic.  We discussed the medication.  I recommended that she start the medication to help with her comorbidities and weight loss.  She was on 3 L nasal cannula.  Her weight was up to 377 pounds.  She had bilateral lower extremity 1+ pitting edema.  Lungs were clear to auscultation.  She reported compliance with her medications.  I  refered to wound care for evaluation and recommend bilateral Unna boots.  I  ordered fasting lipids and LFTs and planned follow-up in 6 months.   She presents to the clinic today for follow-up evaluation and states***.   Today she denies chest pain, increased shortness of breath, increased lower extremity edema, fatigue, palpitations, melena, hematuria, hemoptysis, diaphoresis, weakness, presyncope, syncope, orthopnea, and PND.  Chronic diastolic CHF-weight today 37***7 pounds.   Chronic dyspnea with increased physical activity.  She was seen and evaluated by pulmonology who diagnosed chronic hypoxic respiratory failure.  Breathing stable. Continue current medical  therapy Low-sodium diet-reviewed Chair exercises given Daily weights-contact office with any increase of 3 pounds overnight or 5 pounds in 1 week-again reviewed Continue lower extremity support stockings   Coronary artery disease- continues with nonexertional chest discomfort that is atypical in nature.  Discomfort continues to appear to be musculoskeletal in nature. She had cardiac catheterization 03/28/2020 which showed LAD lesions x2.  She received mid LAD PCI and DES x1.  Details above. Continue aspirin,  rosuvastatin, metoprolol Heart healthy low-sodium diet   Fatigue/DOE-unchanged.  Continues with 2***L of oxygen.  Secondary to chronic hypoxic respiratory failure and body habitus Continue 2 L of O2 Follows with pulmonology   Essential hypertension-BP today 106/6***0. Continue metoprolol, spironolactone Maintain blood pressure log Heart healthy low-sodium diet   Hyperlipidemia-LDL 48 on 05/31/21.  Continue rosuvastatin Heart healthy low-sodium high-fiber diet Increase physical activity as tolerated Repeat fasting lipids and LFTs    Type 2 diabetes- glucose 100 on 11/01/2022.   Continue current medical therapy Follows with PCP   Morbid obesity-weight today 37***7 pounds.  Continues on semaglutide.  She is limited with her  mobility due to low back pain and lower extremity numbness. Continue weight loss Reduced calorie diet Follows with PCP   Disposition: Follow-up with Dr. Jens Som in 6-9 months.  Home Medications    Prior to Admission medications   Medication Sig Start Date End Date Taking? Authorizing Provider  acetaminophen (TYLENOL) 500 MG tablet Take 1,000 mg by mouth every 6 (six) hours as needed for moderate pain or headache.    [provider]  aspirin EC 81 MG tablet Take 81 mg by mouth daily. Swallow whole.    [provider]  B Complex-C (SUPER B COMPLEX PO) Take 1 tablet by mouth daily.    [provider]  Cholecalciferol (VITAMIN D3)  50 MCG (2000 UT) capsule Take 2,000 Units by mouth daily.    [provider]  Continuous Glucose Sensor (FREESTYLE LIBRE 3 PLUS SENSOR) MISC Inject 1 Device into the skin continuous. Change every 15 days 01/16/23   [name] Rattan, MD  Continuous Glucose Sensor (FREESTYLE LIBRE 3 SENSOR) MISC APPLY 1 SENSOR ON UPPER ARM EVERY 14 DAYS FOR CONTINUOUS GLUCOSE MONITORING 09/16/22   Motwani, Carin Hock, MD  DULoxetine (CYMBALTA) 30 MG capsule Take 30 mg by mouth daily. 06/21/22 06/21/26  [provider]  empagliflozin (JARDIANCE) 25 MG TABS tablet Take 1 tablet (25 mg total) by mouth daily before breakfast. 10/17/22   [name] Cleves, MD  furosemide (LASIX) 40 MG tablet Take 1 tablet by mouth once daily 04/02/23   Lewayne Bunting, MD  gabapentin (NEURONTIN) 300 MG capsule Take 2 capsules by mouth 3 (three) times daily. 03/23/21   [provider]  insulin regular human CONCENTRATED (HUMULIN R U-500 KWIKPEN) 500 UNIT/ML KwikPen INJECT 80 UNITS SUBCUTANEOUSLY TWICE DAILY WITH A MEAL 02/05/23   [name] Xenia, MD  Insulin Syringes, Disposable, U-100 1 ML MISC Use to inject insulin daily 02/14/22   Reather Littler, MD  levothyroxine (SYNTHROID, LEVOTHROID) 50 MCG tablet Take 50 mcg by mouth daily. 04/15/14   [provider]  metFORMIN (GLUCOPHAGE-XR) 500 MG 24 hr tablet TAKE 4 TABLETS BY MOUTH WITH BREAKFAST 03/24/23   [name] , MD  metoprolol succinate (TOPROL-XL) 50 MG 24 hr tablet Take 1 tablet by mouth once daily 08/13/22   Lewayne Bunting, MD  nystatin cream (MYCOSTATIN) Apply 1 application  topically 2 (two) times daily. Abdominal folds    [provider]  omeprazole (PRILOSEC) 40 MG capsule Take 40 mg by mouth 2 (two) times daily.    [provider]  potassium chloride (KLOR-CON) 10 MEQ tablet Take 2 tablets by mouth once daily 02/06/23   Lewayne Bunting, MD  RELION PEN NEEDLES 31G X 6 MM MISC USE 1  TWICE DAILY 01/06/23   Motwani, Carin Hock, MD  rosuvastatin (CRESTOR)  40 MG tablet Take 40 mg by mouth daily. 03/18/21   [provider]  Semaglutide,0.25 or 0.5MG /DOS, 2 MG/3ML SOPN Inject 0.25 mg into the skin once a week. 11/01/22   Motwani, Carin Hock, MD  Sennosides 17.2 MG TABS Take 34.4 mg by mouth 3 (three) times a week. No set days    [provider]    Family History    Family History  Problem  Relation Age of Onset   Diabetes Mother    Diabetes Father    Heart disease Neg Hx    She indicated that her mother is alive. She indicated that her father is alive. She indicated that her maternal grandmother is deceased. She indicated that her maternal grandfather is deceased. She indicated that her paternal grandmother is deceased. She indicated that her paternal grandfather is deceased. She indicated that the status of her neg hx is unknown.  Social History    Social History   Socioeconomic History   Marital status: Single    Spouse name: Not on file   Number of children: Not on file   Years of education: Not on file   Highest education level: Not on file  Occupational History   Not on file  Tobacco Use   Smoking status: Never    Passive exposure: Past   Smokeless tobacco: Never  Vaping Use   Vaping status: Never Used  Substance and Sexual Activity   Alcohol use: No   Drug use: No   Sexual activity: Not on file  Other Topics Concern   Not on file  Social History Narrative   Not on file   Social Drivers of Health   Financial Resource Strain: High Risk (06/21/2022)   Received from [facility]   Overall Financial Resource Strain (CARDIA)    Difficulty of Paying Living Expenses: Very hard  Food Insecurity: Food Insecurity Present (06/21/2022)   Received from [facility], [facility]   Hunger Vital Sign    Worried About Running Out of Food in the Last Year: Often true    Ran Out of Food in the Last Year: Often true  Transportation Needs: No Transportation Needs (06/21/2022)   Received from [employer],  [facility]   PRAPARE - Transportation    Lack of Transportation (Medical): No    Lack of Transportation (Non-Medical): No  Physical Activity: Inactive (03/29/2020)   Exercise Vital Sign    Days of Exercise per Week: 0 days    Minutes of Exercise per Session: 0 min  Stress: Not on file  Social Connections: Unknown (05/29/2021)   Received from [facility], [facility]   Social Network    Social Network: Not on file  Intimate Partner Violence: Unknown (04/30/2021)   Received from [facility], [facility]   HITS    Physically Hurt: Not on file    Insult or Talk Down To: Not on file    Threaten Physical Harm: Not on file    Scream or Curse: Not on file     Review of Systems    General:  No chills, fever, night sweats or weight changes.  Cardiovascular:  No chest pain, dyspnea on exertion, edema, orthopnea, palpitations, paroxysmal nocturnal dyspnea. Dermatological: No rash, lesions/masses Respiratory: No cough, dyspnea Urologic: No hematuria, dysuria Abdominal:   No nausea, vomiting, diarrhea, bright red blood per rectum, melena, or hematemesis Neurologic:  No visual changes, wkns, changes in mental status. All other systems reviewed and are otherwise negative except as noted above.  Physical Exam    VS:  There were no vitals taken for this visit. , BMI There is no height or weight on file to calculate BMI. GEN: Well nourished, well developed, in no acute distress. HEENT: normal. Neck: Supple, no JVD, carotid bruits, or masses. Cardiac: RRR, no murmurs, rubs, or gallops. No clubbing, cyanosis, edema.  Radials/DP/PT 2+ and equal bilaterally.  Respiratory:  Respirations regular and unlabored, clear  to auscultation bilaterally. GI: Soft, nontender, nondistended, BS + x 4. MS: no deformity or atrophy. Skin: warm and dry, no rash. Neuro:  Strength and sensation are intact. Psych: Normal affect.  Accessory Clinical Findings    Recent Labs: 11/01/2022: BUN 27; Creatinine,  Ser 0.93; Potassium 4.0; Sodium 142; TSH 3.57   Recent Lipid Panel    Component Value Date/Time   CHOL 121 05/31/2021 1220   TRIG 175.0 (H) 05/31/2021 1220   HDL 38.60 (L) 05/31/2021 1220   CHOLHDL 3 05/31/2021 1220   VLDL 35.0 05/31/2021 1220   LDLCALC 48 05/31/2021 1220    No BP recorded.  {Refresh Note OR Click here to enter BP  :1}***    ECG personally reviewed by me today- ***     EKG 06/21/2022.  Shows normal sinus rhythm right bundle branch block left anterior fascicular block 68 bpm.   The following studies were reviewed today:   Echocardiogram 03/28/2020 IMPRESSIONS     1. Overall poor image quality even with definity Septal and apical  hypokinesis . Left ventricular ejection fraction, by estimation, is 50 to  55%. The left ventricle has low normal function. The left ventricle has no  regional wall motion abnormalities.  Left ventricular diastolic parameters were normal.   2. Right ventricular systolic function is normal. The right ventricular  size is normal.   3. Left atrial size was mildly dilated.   4. The mitral valve is normal in structure. No evidence of mitral valve  regurgitation. No evidence of mitral stenosis.   5. The aortic valve was not well visualized. Aortic valve regurgitation  is not visualized. No aortic stenosis is present.   6. The inferior vena cava is normal in size with greater than 50%  respiratory variability, suggesting right atrial pressure of 3 mmHg.   Echocardiogram 11/10/2020   IMPRESSIONS     1. Left ventricular ejection fraction, by estimation, is 60 to 65%. The  left ventricle has normal function. The left ventricle has no regional  wall motion abnormalities. The left ventricular internal cavity size was  mildly dilated. There is mild left  ventricular hypertrophy. Left ventricular diastolic parameters were  normal.   2. Right ventricular systolic function is normal. The right ventricular  size is normal.   3. The mitral  valve is normal in structure. Trivial mitral valve  regurgitation. No evidence of mitral stenosis.   4. The aortic valve is normal in structure. Aortic valve regurgitation is  not visualized. No aortic stenosis is present.   5. Aortic dilatation noted. There is borderline dilatation of the  ascending aorta, measuring 39 mm.   6. The inferior vena cava is normal in size with greater than 50%  respiratory variability, suggesting right atrial pressure of 3 mmHg.   Comparison(s): EF 50 %, hypokinesis.   Cardiac catheterization 03/28/2020 Dist RCA lesion is 25% stenosed. Mid LAD-1 lesion is 95% stenosed. A drug-eluting stent was successfully placed using a STENT RESOLUTE ONYX [id number]. The proximal stent was postdilated with a 3.75 mm [place] baloon. Post intervention, there is a 0% residual stenosis. Mid LAD-2 lesion is 50% stenosed. LV end diastolic pressure is moderately elevated. LVEDP 27 mm Hg. There is no aortic valve stenosis.   Continue dual antiplatelet therapy for 12 months. Consider clopidogrel monotherapy after 12 months, along with aggressive secondary prevention.     Increase statin to high potency.     Continue with plans for diuresis.  D/w Dr. Jens Som.  Diagnostic Dominance: Right  Intervention             Assessment & Plan   1.  ***   Thomasene Ripple. Zabella Wease NP-C     04/06/2023, 3:54 PM [facility] [address] Office [phone] Fax [phone]    I spent***minutes examining this patient, reviewing medications, and using patient centered shared decision making involving their cardiac care.   I spent  20 minutes reviewing past medical history,  medications, and prior cardiac tests.

## 2023-04-08 ENCOUNTER — Ambulatory Visit: Payer: Medicaid Other | Attending: General Practice | Admitting: General Practice

## 2023-04-09 ENCOUNTER — Encounter: Payer: Self-pay | Admitting: General Practice

## 2023-04-22 ENCOUNTER — Other Ambulatory Visit: Payer: Self-pay | Admitting: "Endocrinology

## 2023-04-22 DIAGNOSIS — E1165 Type 2 diabetes mellitus with hyperglycemia: Secondary | ICD-10-CM

## 2023-04-23 ENCOUNTER — Ambulatory Visit (INDEPENDENT_AMBULATORY_CARE_PROVIDER_SITE_OTHER): Payer: Medicaid Other | Admitting: Pulmonary Disease

## 2023-04-23 ENCOUNTER — Ambulatory Visit

## 2023-04-23 ENCOUNTER — Encounter: Payer: Self-pay | Admitting: Pulmonary Disease

## 2023-04-23 VITALS — BP 120/64 | HR 66 | Temp 98.0°F | Ht 66.0 in | Wt 360.6 lb

## 2023-04-23 DIAGNOSIS — J9611 Chronic respiratory failure with hypoxia: Secondary | ICD-10-CM

## 2023-04-23 DIAGNOSIS — G4733 Obstructive sleep apnea (adult) (pediatric): Secondary | ICD-10-CM | POA: Diagnosis not present

## 2023-04-23 NOTE — Addendum Note (Signed)
 Addended byClyda Greener M on: 04/23/2023 03:10 PM   Modules accepted: Orders

## 2023-04-23 NOTE — Patient Instructions (Signed)
 VISIT SUMMARY:  Today, we discussed your increased difficulty breathing, particularly during physical activity, and the sensation of heaviness in your chest. We also reviewed your current oxygen use and concerns about breathing difficulties during sleep. Additionally, we talked about your ongoing management of heart failure and the impact of recent family losses on your depression.  YOUR PLAN:  -DYSPNEA: Dyspnea means difficulty breathing. You are experiencing worsening symptoms, especially when walking, and a feeling of heaviness in your chest. We will order a chest X-ray to check your lungs and reschedule your sleep study to see if you need CPAP or BiPAP therapy to help with your breathing at night.  -HEART FAILURE: Heart failure means your heart is not pumping blood as well as it should. You are continuing your management with your cardiologists, Dr. Jens Som and Dr. Molli Hazard. There are no new symptoms or changes in your treatment at this time.  -DEPRESSION: Depression is a mental health condition that affects your mood and ability to function. Your depression has worsened due to the recent loss of your mother and brother. You are currently taking two pills daily, which has provided some improvement, but we will continue to monitor and manage your condition.  INSTRUCTIONS:  Please complete the chest X-ray as ordered. We will also work on rescheduling your sleep study to assess your need for CPAP or BiPAP therapy. Continue your current heart failure management with Dr. Jens Som and Dr. Molli Hazard. If you have any concerns or notice any new symptoms, please contact our office.

## 2023-04-23 NOTE — Progress Notes (Addendum)
 Jevaeh Shams    [id number]    27-Nov-1970  Primary Care Physician:Shepperson, Tonny Bollman PA-C  Referring Physician: Julien Girt, PA-C [address] [place],  [place] [address]  Chief complaint: Follow-up for chronic hypoxic respiratory failure, sleep apnea  HPI: 53 y.o. who  has a past medical history of CAD (coronary artery disease), Chronic diastolic heart failure (HCC), DDD (degenerative disc disease), lumbar, Diabetes mellitus without complication (HCC), GERD (gastroesophageal reflux disease), Hyperlipidemia, Hypertension, Hypothyroidism, Lumbar herniated disc, Major depressive disorder, and Morbid obesity (HCC).   Originally seen in October 2023 for dyspnea, chronic hypoxic respiratory failure.  She has history significant for coronary artery disease s/p stenting of her LAD in 2021 after non-ST elevation MI.  She has been on oxygen therapy since then.  Currently on 2 L oxygen 24/7   Pets: No pets Occupation: Used to work as a Education administrator.  Went on disability in 2014 Exposures: No mold, hot tub, Jacuzzi.  No feather pillows or comforters Smoking history: Never smoker Travel history: Originally from [place].  No recent travel Relevant family history: No family history of lung disease  Interim history: Discussed the use of AI scribe software for clinical note transcription with the patient, who gave verbal consent to proceed.  History of Present Illness Terril Fleishman is a 53 year old female with heart failure who presents with difficulty breathing.  She experiences increased difficulty breathing, particularly during physical activity, and becomes winded more quickly. There is a sensation of heaviness in the middle of her chest. She uses three liters of oxygen during the day and increases to four liters at night to aid her breathing during sleep.  She is not currently using CPAP or BiPAP therapy. She wants to complete a sleep  study to address her concerns about breathing difficulties during sleep. She has attempted to reschedule a missed sleep study appointment but has not received a response.  She has a history of depression, which has worsened following the recent deaths of her mother and brother from cancer. She takes two pills daily for her depression and reports some improvement, though she continues to work on managing her mental health.    Outpatient Encounter Medications as of 04/23/2023  Medication Sig   acetaminophen (TYLENOL) 500 MG tablet Take 1,000 mg by mouth every 6 (six) hours as needed for moderate pain or headache.   aspirin EC 81 MG tablet Take 81 mg by mouth daily. Swallow whole.   B Complex-C (SUPER B COMPLEX PO) Take 1 tablet by mouth daily.   Cholecalciferol (VITAMIN D3) 50 MCG (2000 UT) capsule Take 2,000 Units by mouth daily.   Continuous Glucose Sensor (FREESTYLE LIBRE 3 PLUS SENSOR) MISC Inject 1 Device into the skin continuous. Change every 15 days   Continuous Glucose Sensor (FREESTYLE LIBRE 3 SENSOR) MISC APPLY 1 SENSOR ON UPPER ARM EVERY 14 DAYS FOR CONTINUOUS GLUCOSE MONITORING   DULoxetine (CYMBALTA) 30 MG capsule Take 30 mg by mouth daily.   empagliflozin (JARDIANCE) 25 MG TABS tablet Take 1 tablet (25 mg total) by mouth daily before breakfast.   furosemide (LASIX) 40 MG tablet Take 1 tablet by mouth once daily   gabapentin (NEURONTIN) 300 MG capsule Take 2 capsules by mouth 3 (three) times daily.   HUMULIN R U-500 KWIKPEN 500 UNIT/ML KwikPen INJECT 80 UNITS SUBCUTANEOUSLY TWICE DAILY WITH A MEAL   Insulin Syringes, Disposable, U-100 1 ML  MISC Use to inject insulin daily   levothyroxine (SYNTHROID, LEVOTHROID) 50 MCG tablet Take 50 mcg by mouth daily.   metFORMIN (GLUCOPHAGE-XR) 500 MG 24 hr tablet TAKE 4 TABLETS BY MOUTH WITH BREAKFAST   metoprolol succinate (TOPROL-XL) 50 MG 24 hr tablet Take 1 tablet by mouth once daily   nystatin cream (MYCOSTATIN) Apply 1 application  topically  2 (two) times daily. Abdominal folds   omeprazole (PRILOSEC) 40 MG capsule Take 40 mg by mouth 2 (two) times daily.   potassium chloride (KLOR-CON) 10 MEQ tablet Take 2 tablets by mouth once daily   RELION PEN NEEDLES 31G X 6 MM MISC USE 1  TWICE DAILY   rosuvastatin (CRESTOR) 40 MG tablet Take 40 mg by mouth daily.   Semaglutide,0.25 or 0.5MG /DOS, 2 MG/3ML SOPN Inject 0.25 mg into the skin once a week.   Sennosides 17.2 MG TABS Take 34.4 mg by mouth 3 (three) times a week. No set days   No facility-administered encounter medications on file as of 04/23/2023.    Physical Exam: Blood pressure 120/64, pulse 66, temperature 98 F (36.7 C), temperature source Oral, height 5\' 6"  (1.676 m), weight (!) 360 lb 9.6 oz (163.6 kg), SpO2 100%. Gen:      No acute distress, obese HEENT:  EOMI, sclera anicteric Neck:     No masses; no thyromegaly Lungs:    Clear to auscultation bilaterally; normal respiratory effort CV:         Regular rate and rhythm; no murmurs Abd:      + bowel sounds; soft, non-tender; no palpable masses, no distension Ext:    No edema; adequate peripheral perfusion Skin:      Warm and dry; no rash Neuro: alert and oriented x 3 Psych: normal mood and affect   Data Reviewed: Imaging: Chest x-ray 07/21/2021-no active cardiopulmonary disease Chest x-ray 11/15/2021-no active cardiopulmonary disease I had reviewed the images personally.  PFTs: 11/15/2021 FEV1 2.47 [82%], F/F93  Labs:  Sleep: Sleep study 02/22/2022 Severe obstructive sleep apnea [AHI 77.3], moderate oxygen desaturation to 73%  Assessment:  Chronic hypoxic respiratory failure Likely secondary to chronic congestive heart failure, obesity.  Follows with cardiology and is on Lasix Continue supplemental oxygen  Sleep apnea Sleep study last year with severe obstructive sleep apnea and moderate oxygen desaturation.  I suspect she has obesity hypoventilation syndrome as there is elevated bicarb on labs She will  need a BiPAP titration study.  This has been ordered but could not be completed as she had some issues at home Will reorder BiPAP titration study  Plan/Recommendations: BiPAP titration study Continue supplemental oxygen  Chilton Greathouse MD [facility] 04/23/2023, 2:35 PM  CC: Julien Girt, PA*

## 2023-04-23 NOTE — Telephone Encounter (Signed)
 Requested Prescriptions   Pending Prescriptions Disp Refills   HUMULIN R U-500 KWIKPEN 500 UNIT/ML KwikPen [Pharmacy Med Name: HumuLIN R U-500 KwikPen 500 UNIT/ML Subcutaneous Solution Pen-injector] 12 mL 0    Sig: INJECT 80 UNITS SUBCUTANEOUSLY TWICE DAILY WITH A MEAL

## 2023-05-03 ENCOUNTER — Other Ambulatory Visit: Payer: Self-pay | Admitting: "Endocrinology

## 2023-05-03 DIAGNOSIS — E1165 Type 2 diabetes mellitus with hyperglycemia: Secondary | ICD-10-CM

## 2023-05-04 ENCOUNTER — Other Ambulatory Visit: Payer: Self-pay | Admitting: Cardiology

## 2023-05-05 ENCOUNTER — Encounter: Payer: Self-pay | Admitting: Pulmonary Disease

## 2023-05-05 NOTE — Telephone Encounter (Signed)
 Requested Prescriptions   Pending Prescriptions Disp Refills   OZEMPIC, 0.25 OR 0.5 MG/DOSE, 2 MG/3ML SOPN [Pharmacy Med Name: Ozempic (0.25 or 0.5 MG/DOSE) 2 MG/3ML Subcutaneous Solution Pen-injector] 3 mL 0    Sig: INJECT 0.25 MG SUBCUTANEOUSLY ONCE A WEEK

## 2023-05-18 NOTE — Progress Notes (Deleted)
 Cardiology Office Note:    Date:  05/18/2023   ID:  [name], DOB Feb 11, 1970, MRN [id number]  PCP:  Maryln Sober, PA-C   [facility] Providers Cardiologist:  Alexandria Angel, MD      Referring MD: Yvonnie Heritage, FNP   Follow-up evaluation of her acute on chronic diastolic CHF, and CAD.  History of Present Illness:    [name] is a 53 y.o. female with a hx of hypertension, GERD, depression, hypothyroidism, morbid obesity, uncontrolled type 2 diabetes, and chronic diastolic CHF.   She was admitted to hospital with chest discomfort and on 03/28/2020.  Her high-sensitivity troponins were elevated at 514 and climbed to over 2000.  Her EKG showed nonspecific changes.  She was diagnosed with NSTEMI and taken to the Cath Lab.  Cardiac catheterization showed 25% distal RCA, mid LAD 95%, mid LAD-to 50%, moderately elevated LVEDP at 27 mmHg.  She received PCI with DES x1 to her mid LAD.  Her echocardiogram at that time showed normal LVEF, septal/apical hypokinesis, normal diastolic function, no valvular abnormalities and normal right ventricular function.   She followed up with heart and vascular transition of care clinic 04/06/2020.  At that time she reported she felt achy since being discharged from the hospital.  She denied chest pain except with deep inhalation.  Her discomfort resolved with exhalation.  She reported overall feeling better.  Her medications were reviewed.  It was noted that her pillbox was not in order, containing days with missing doses of certain medications and multiple doses on other days.  She reported having very poor vision and having difficulty with her pill bottles.   She presented to the clinic 06/06/20 for follow-up evaluation stated she felt fairly well.  She did report that she had increased anxiety related to doctors visits and medical procedures.  She ha noticed occasional brief episodes of sharp chest pain.  She reported that the pain could be  reproduced with pressing over the area.  I educated her that this was related to muscle wall pain.  She expressed understanding.  She reported that she did have occasional brief episodes of palpitations.  She reported that these would happen a couple times a week.  She also reported that she had presented to the emergency department with complaints of lower extremity swelling.  She was instructed to take extra doses of her furosemide .  She reported that she followed a low-sodium diet and restricted her fluid.  She was breathing much better with 2 L of oxygen via nasal cannula.  We gave  her the [place] support stocking sheet, salty 6 diet sheet, have her increase her physical activity as tolerated, continue current medication regimen and order a BMP.   Follow-up was planned for 6 months with Dr. Audery Blazing.  She presented to the clinic 10/18/20 for follow-up evaluation stated she had noticed that she was more fatigued over the last several months.  She noticed that she continued to need her oxygen.  She reported that when she was asleep at night and her oxygen would fall off her saturation will drop into the 70s.   She was maintaining a heart healthy low-sodium diet.  She reported that she did have some lower extremity numbness and that she had a herniated lumbar disc.  She continued to have some right-sided/shoulder area discomfort as well as left-sided shoulder discomfort.  Appeared to be related to deconditioning and musculoskeletal pain related to the use of her walker.  Her weight had increased  about 10-11 pounds.  She denied  bleeding issues.  She performed a walking test without her oxygen and her oxygen saturation dropped to 87 after only ambulating 10-12 feet.  I ordered a sleep study, sent my note to her insurance company so that she may maintain her oxygen therapy, asked to her increase her physical activity, repeated an echocardiogram, and planned follow-up after her sleep study.  We reviewed the  importance of weight loss and increasing physical activity.  I recommended that she start doing chair type exercises multiple times per day.  She was seen in follow-up by Dr. Audery Blazing on 03/28/2021.  Her echocardiogram 10/22 showed normal LV function, mid left ventricular enlargement, mild left ventricular hypertrophy.  She reported multiple complaints.  She noted dyspnea on exertion.  She described bilateral lower extremity edema which was worse with her legs down.  She noted occasional pain in her right chest that was unlike her previous infarct pain.  She denied syncope.  Her Lasix  was continued.  She was instructed to follow-up with pulmonology for her chronic dyspnea/respiratory failure and for possible sleep apnea.  Her rosuvastatin  was increased to 40 mg daily with plan to repeat lipids and LFTs in 8 weeks.  She was seen in the emergency department on 07/22/2021.  She complained of shortness of breath and chest discomfort.  She was also noted to have increased lower extremity fluid.  She was wearing 2.5 L nasal cannula and denied fever and chills.  She reported a cough x5 days which was nonproductive.  Her chest x-ray showed no acute cardiopulmonary disease.  Her high-sensitivity troponins were low and flat.  Her BNP was 50.  She received IV Lasix  in the emergency department and urinated around 2 L.  She reported that her breathing was significantly improved.  She was advised to continue her home furosemide .  She presented to the clinic 08/20/21 for follow-up evaluation stated she continued to use 2 L of oxygen while she was away from home and 2-1/2 L of oxygen at home.  She reported intermittent periods of shortness of breath with laying back.  We reviewed her most recent emergency department visit.  She expressed understanding.  She remained limited in her physical activity due to back pain.  She reported that she was trying to wean oxygen but was unable to titrate down.  We reviewed her echocardiogram  and her pumping function.  She continued to be compliant with fluid restriction.  We reviewed the importance of daily weights and contacting the office with a weight increase of 2 to 3 pounds overnight or 5 pounds in 1 week.  I refered her to pulmonology for evaluation of her lungs and recommendations for coming off oxygen/treatment.  I reviewed the importance of lower extremity support stockings, she has been tolerating lower extremity wraps and is unable to manage support stockings.  I planned follow-up in 6 months.  She was seen by Dr. Waylan Haggard 04/19/2022.  She has been diagnosed with chronic respiratory failure with hypoxia.  She was continued on 2 L of oxygen.  It was felt that her chronic hypoxic respiratory failure is due to obesity and CHF.  She underwent sleep study which showed severe OSA with moderate oxygen desaturation ration.  It was felt this was secondary to obesity hypoventilation syndrome.  BiPAP titration study was recommended.  She presented to the clinic 06/21/22 for follow-up evaluation and stated she continued to see pulmonology and endocrinology.  She reported was given samples of Ozempic .  We discussed the medication.  I recommended that she start the medication to help with her comorbidities and weight loss.  She was on 3 L nasal cannula.  Her weight was up to 377 pounds.  She had bilateral lower extremity 1+ pitting edema.  Lungs were clear to auscultation.  She reported compliance with her medications.  I  refered to wound care for evaluation and recommend bilateral Unna boots.  I  ordered fasting lipids and LFTs and planned follow-up in 6 months.   She presents to the clinic today for follow-up evaluation and states***.   Today she denies chest pain, increased shortness of breath, increased lower extremity edema, fatigue, palpitations, melena, hematuria, hemoptysis, diaphoresis, weakness, presyncope, syncope, orthopnea, and PND.   Chronic diastolic CHF-weight today 37***7 pounds.    Chronic dyspnea with increased physical activity.  She was seen and evaluated by pulmonology who diagnosed chronic hypoxic respiratory failure.  Breathing stable. Continue current medical therapy Low-sodium diet-reviewed Chair exercises given Daily weights-contact office with any increase of 3 pounds overnight or 5 pounds in 1 week-again reviewed Continue lower extremity support stockings   Coronary artery disease- continues with nonexertional chest discomfort that is atypical in nature.  Discomfort continues to appear to be musculoskeletal in nature. She had cardiac catheterization 03/28/2020 which showed LAD lesions x2.  She received mid LAD PCI and DES x1.  Details above. Continue aspirin ,  rosuvastatin , metoprolol  Heart healthy low-sodium diet   Fatigue/DOE-unchanged.  Continues with 2***L of oxygen.  Secondary to chronic hypoxic respiratory failure and body habitus Continue 2 L of O2 Follows with pulmonology   Essential hypertension-BP today 106/6***0. Continue metoprolol , spironolactone  Maintain blood pressure log Heart healthy low-sodium diet   Hyperlipidemia-LDL 48 on 05/31/21.  Continue rosuvastatin  Heart healthy low-sodium high-fiber diet Increase physical activity as tolerated Repeat fasting lipids and LFTs    Type 2 diabetes- glucose 100 on 11/01/2022.   Continue current medical therapy Follows with PCP   Morbid obesity-weight today 37***7 pounds.  Continues on semaglutide .  She is limited with her  mobility due to low back pain and lower extremity numbness. Continue weight loss Reduced calorie diet Follows with PCP   Disposition: Follow-up with Dr. Audery Blazing in 6-9 months.   Past Medical History:  Diagnosis Date   CAD (coronary artery disease)    a. s/p NSTEMI, LHC 03/2020 dRCA 25%, m-LAD-1 95%-0% (DES), mLAD-2 50%, LVEDP moderately elevated, 27 mmHg.   Chronic diastolic heart failure (HCC)    a. LHC/echo 3/22 c/ elevated LVEDP, EF 50-55%, hypokinesis; b. Echo 10/22  EF 60-65%, mild LVH, borderline dilatation of ascending aorta, 39mm.   DDD (degenerative disc disease), lumbar    Diabetes mellitus without complication (HCC)    GERD (gastroesophageal reflux disease)    Hyperlipidemia    Hypertension    Hypothyroidism    Lumbar herniated disc    Major depressive disorder    Morbid obesity (HCC)     Past Surgical History:  Procedure Laterality Date   CORONARY STENT INTERVENTION N/A 03/28/2020   Procedure: CORONARY STENT INTERVENTION;  Surgeon: Lucendia Rusk, MD;  Location: [facility] INVASIVE CV LAB;  Service: Cardiovascular;  Laterality: N/A;   DILATION AND CURETTAGE OF UTERUS     INCISION AND DRAINAGE PERIRECTAL ABSCESS  06/21/2014   INCISION AND DRAINAGE PERIRECTAL ABSCESS Bilateral 06/21/2014   Procedure: IRRIGATION AND DEBRIDEMENT PERIRECTAL ABSCESS;  Surgeon: Oza Blumenthal, MD;  Location: [facility] OR;  Service: General;  Laterality: Bilateral;   LEFT HEART CATH AND CORONARY ANGIOGRAPHY  N/A 03/28/2020   Procedure: LEFT HEART CATH AND CORONARY ANGIOGRAPHY;  Surgeon: Lucendia Rusk, MD;  Location: [facility] INVASIVE CV LAB;  Service: Cardiovascular;  Laterality: N/A;    Current Medications: No outpatient medications have been marked as taking for the 05/20/23 encounter (Appointment) with Carie Charity, NP.     Allergies:   Irbesartan , Clindamycin/lincomycin, Latex, and Ultram [tramadol]   Social History   Socioeconomic History   Marital status: Single    Spouse name: Not on file   Number of children: Not on file   Years of education: Not on file   Highest education level: Not on file  Occupational History   Not on file  Tobacco Use   Smoking status: Never    Passive exposure: Past   Smokeless tobacco: Never  Vaping Use   Vaping status: Never Used  Substance and Sexual Activity   Alcohol use: No   Drug use: No   Sexual activity: Not on file  Other Topics Concern   Not on file  Social History Narrative   Not on file   Social Drivers of  Health   Financial Resource Strain: Low Risk  (04/22/2023)   Received [facility]   Overall Financial Resource Strain (CARDIA)    Difficulty of Paying Living Expenses: Not hard at all  Food Insecurity: No Food Insecurity (04/22/2023)   Received from [facility]   Hunger Vital Sign    Worried About Running Out of Food in the Last Year: Never true    Ran Out of Food in the Last Year: Never true  Transportation Needs: No Transportation Needs (04/22/2023)   Received from [facility] - Transportation    Lack of Transportation (Medical): No    Lack of Transportation (Non-Medical): No  Physical Activity: Inactive (03/29/2020)   Exercise Vital Sign    Days of Exercise per Week: 0 days    Minutes of Exercise per Session: 0 min  Stress: Not on file  Social Connections: Unknown (05/29/2021)   Received from [facility]   Social Network    Social Network: Not on file     Family History: The patient's family history includes Diabetes in her father and mother. There is no history of Heart disease.  ROS:   Please see the history of present illness.     All other systems reviewed and are negative.   Risk Assessment/Calculations:           Physical Exam:    VS:  There were no vitals taken for this visit.    Wt Readings from Last 3 Encounters:  04/23/23 (!) 360 lb 9.6 oz (163.6 kg)  11/01/22 (!) 394 lb 12.8 oz (179.1 kg)  06/21/22 (!) 377 lb (171 kg)     GEN:  Well nourished, well developed in no acute distress HEENT: Normal NECK: No JVD; No carotid bruits LYMPHATICS: No lymphadenopathy CARDIAC: RRR, no murmurs, rubs, gallops bilateral lower extremity 1+ pitting edema RESPIRATORY:  Clear to auscultation without rales, wheezing or rhonchi  ABDOMEN: Soft, non-tender, non-distended MUSCULOSKELETAL:  No edema; No deformity  SKIN: Warm and dry.  Left lower extremity intact blisters.  No erythema NEUROLOGIC:  Alert and oriented x 3 PSYCHIATRIC:   Normal affect    EKGs/Labs/Other Studies Reviewed:    EKG 06/21/2022.  Shows normal sinus rhythm right bundle branch block left anterior fascicular block 68 bpm.  The following studies were reviewed today:  Echocardiogram 03/28/2020 IMPRESSIONS  1. Overall poor image quality even with definity  Septal and apical  hypokinesis . Left ventricular ejection fraction, by estimation, is 50 to  55%. The left ventricle has low normal function. The left ventricle has no  regional wall motion abnormalities.  Left ventricular diastolic parameters were normal.   2. Right ventricular systolic function is normal. The right ventricular  size is normal.   3. Left atrial size was mildly dilated.   4. The mitral valve is normal in structure. No evidence of mitral valve  regurgitation. No evidence of mitral stenosis.   5. The aortic valve was not well visualized. Aortic valve regurgitation  is not visualized. No aortic stenosis is present.   6. The inferior vena cava is normal in size with greater than 50%  respiratory variability, suggesting right atrial pressure of 3 mmHg.   Echocardiogram 11/10/2020  IMPRESSIONS     1. Left ventricular ejection fraction, by estimation, is 60 to 65%. The  left ventricle has normal function. The left ventricle has no regional  wall motion abnormalities. The left ventricular internal cavity size was  mildly dilated. There is mild left  ventricular hypertrophy. Left ventricular diastolic parameters were  normal.   2. Right ventricular systolic function is normal. The right ventricular  size is normal.   3. The mitral valve is normal in structure. Trivial mitral valve  regurgitation. No evidence of mitral stenosis.   4. The aortic valve is normal in structure. Aortic valve regurgitation is  not visualized. No aortic stenosis is present.   5. Aortic dilatation noted. There is borderline dilatation of the  ascending aorta, measuring 39 mm.   6. The inferior  vena cava is normal in size with greater than 50%  respiratory variability, suggesting right atrial pressure of 3 mmHg.   Comparison(s): EF 50 %, hypokinesis.  Cardiac catheterization 03/28/2020 Dist RCA lesion is 25% stenosed. Mid LAD-1 lesion is 95% stenosed. A drug-eluting stent was successfully placed using a STENT RESOLUTE ONYX [id number]. The proximal stent was postdilated with a 3.75 mm [place] baloon. Post intervention, there is a 0% residual stenosis. Mid LAD-2 lesion is 50% stenosed. LV end diastolic pressure is moderately elevated. LVEDP 27 mm Hg. There is no aortic valve stenosis.   Continue dual antiplatelet therapy for 12 months. Consider clopidogrel  monotherapy after 12 months, along with aggressive secondary prevention.     Increase statin to high potency.     Continue with plans for diuresis.  D/w Dr. Audery Blazing.  Diagnostic Dominance: Right     Intervention          EKG: ***  Recent Labs: 11/01/2022: BUN 27; Creatinine, Ser 0.93; Potassium 4.0; Sodium 142; TSH 3.57  Recent Lipid Panel    Component Value Date/Time   CHOL 121 05/31/2021 1220   TRIG 175.0 (H) 05/31/2021 1220   HDL 38.60 (L) 05/31/2021 1220   CHOLHDL 3 05/31/2021 1220   VLDL 35.0 05/31/2021 1220   LDLCALC 48 05/31/2021 1220    ASSESSMENT & PLAN   1.***         Signed, Carie Charity, NP  05/18/2023 5:56 PM      Notice: This dictation was prepared with Dragon dictation along with smaller phrase technology. Any transcriptional errors that result from this process are unintentional and may not be corrected upon review.  I spent 15*** minutes examining this patient, reviewing medications, and using patient centered shared decision making involving her cardiac care.   I spent 20 minutes reviewing  past  medical history,  medications, and prior cardiac tests.

## 2023-05-19 ENCOUNTER — Other Ambulatory Visit: Payer: Self-pay | Admitting: "Endocrinology

## 2023-05-19 ENCOUNTER — Ambulatory Visit: Admitting: "Endocrinology

## 2023-05-19 DIAGNOSIS — E1165 Type 2 diabetes mellitus with hyperglycemia: Secondary | ICD-10-CM

## 2023-05-20 ENCOUNTER — Ambulatory Visit: Admitting: General Practice

## 2023-05-22 NOTE — Telephone Encounter (Signed)
 Requested Prescriptions   Pending Prescriptions Disp Refills   HUMULIN R U-500 KWIKPEN 500 UNIT/ML KwikPen [Pharmacy Med Name: HumuLIN R U-500 KwikPen 500 UNIT/ML Subcutaneous Solution Pen-injector] 12 mL 0    Sig: INJECT 80 UNITS SUBCUTANEOUSLY TWICE DAILY WITH A MEAL

## 2023-06-02 ENCOUNTER — Other Ambulatory Visit: Payer: Self-pay | Admitting: Cardiology

## 2023-06-18 NOTE — Progress Notes (Deleted)
 Cardiology Office Note:    Date:  06/18/2023   ID:  [name], DOB 30-Mar-1970, MRN [id number]  PCP:  Maryln Sober, PA-C   [facility] Providers Cardiologist:  Alexandria Angel, MD      Referring MD: Yvonnie Heritage, FNP   Follow-up evaluation of her acute on chronic diastolic CHF, and CAD.  History of Present Illness:    [name] is a 53 y.o. female with a hx of hypertension, GERD, depression, hypothyroidism, morbid obesity, uncontrolled type 2 diabetes, and chronic diastolic CHF.   She was admitted to hospital with chest discomfort and on 03/28/2020.  Her high-sensitivity troponins were elevated at 514 and climbed to over 2000.  Her EKG showed nonspecific changes.  She was diagnosed with NSTEMI and taken to the Cath Lab.  Cardiac catheterization showed 25% distal RCA, mid LAD 95%, mid LAD-to 50%, moderately elevated LVEDP at 27 mmHg.  She received PCI with DES x1 to her mid LAD.  Her echocardiogram at that time showed normal LVEF, septal/apical hypokinesis, normal diastolic function, no valvular abnormalities and normal right ventricular function.   She followed up with heart and vascular transition of care clinic 04/06/2020.  At that time she reported she felt achy since being discharged from the hospital.  She denied chest pain except with deep inhalation.  Her discomfort resolved with exhalation.  She reported overall feeling better.  Her medications were reviewed.  It was noted that her pillbox was not in order, containing days with missing doses of certain medications and multiple doses on other days.  She reported having very poor vision and having difficulty with her pill bottles.   She presented to the clinic 06/06/20 for follow-up evaluation stated she felt fairly well.  She did report that she had increased anxiety related to doctors visits and medical procedures.  She ha noticed occasional brief episodes of sharp chest pain.  She reported that the pain could be  reproduced with pressing over the area.  I educated her that this was related to muscle wall pain.  She expressed understanding.  She reported that she did have occasional brief episodes of palpitations.  She reported that these would happen a couple times a week.  She also reported that she had presented to the emergency department with complaints of lower extremity swelling.  She was instructed to take extra doses of her furosemide .  She reported that she followed a low-sodium diet and restricted her fluid.  She was breathing much better with 2 L of oxygen via nasal cannula.  We gave  her the [place] support stocking sheet, salty 6 diet sheet, have her increase her physical activity as tolerated, continue current medication regimen and order a BMP.   Follow-up was planned for 6 months with Dr. Audery Blazing.  She presented to the clinic 10/18/20 for follow-up evaluation stated she had noticed that she was more fatigued over the last several months.  She noticed that she continued to need her oxygen.  She reported that when she was asleep at night and her oxygen would fall off her saturation will drop into the 70s.   She was maintaining a heart healthy low-sodium diet.  She reported that she did have some lower extremity numbness and that she had a herniated lumbar disc.  She continued to have some right-sided/shoulder area discomfort as well as left-sided shoulder discomfort.  Appeared to be related to deconditioning and musculoskeletal pain related to the use of her walker.  Her weight had increased  about 10-11 pounds.  She denied  bleeding issues.  She performed a walking test without her oxygen and her oxygen saturation dropped to 87 after only ambulating 10-12 feet.  I ordered a sleep study, sent my note to her insurance company so that she may maintain her oxygen therapy, asked to her increase her physical activity, repeated an echocardiogram, and planned follow-up after her sleep study.  We reviewed the  importance of weight loss and increasing physical activity.  I recommended that she start doing chair type exercises multiple times per day.  She was seen in follow-up by Dr. Audery Blazing on 03/28/2021.  Her echocardiogram 10/22 showed normal LV function, mid left ventricular enlargement, mild left ventricular hypertrophy.  She reported multiple complaints.  She noted dyspnea on exertion.  She described bilateral lower extremity edema which was worse with her legs down.  She noted occasional pain in her right chest that was unlike her previous infarct pain.  She denied syncope.  Her Lasix  was continued.  She was instructed to follow-up with pulmonology for her chronic dyspnea/respiratory failure and for possible sleep apnea.  Her rosuvastatin  was increased to 40 mg daily with plan to repeat lipids and LFTs in 8 weeks.  She was seen in the emergency department on 07/22/2021.  She complained of shortness of breath and chest discomfort.  She was also noted to have increased lower extremity fluid.  She was wearing 2.5 L nasal cannula and denied fever and chills.  She reported a cough x5 days which was nonproductive.  Her chest x-ray showed no acute cardiopulmonary disease.  Her high-sensitivity troponins were low and flat.  Her BNP was 50.  She received IV Lasix  in the emergency department and urinated around 2 L.  She reported that her breathing was significantly improved.  She was advised to continue her home furosemide .  She presented to the clinic 08/20/21 for follow-up evaluation stated she continued to use 2 L of oxygen while she was away from home and 2-1/2 L of oxygen at home.  She reported intermittent periods of shortness of breath with laying back.  We reviewed her most recent emergency department visit.  She expressed understanding.  She remained limited in her physical activity due to back pain.  She reported that she was trying to wean oxygen but was unable to titrate down.  We reviewed her echocardiogram  and her pumping function.  She continued to be compliant with fluid restriction.  We reviewed the importance of daily weights and contacting the office with a weight increase of 2 to 3 pounds overnight or 5 pounds in 1 week.  I refered her to pulmonology for evaluation of her lungs and recommendations for coming off oxygen/treatment.  I reviewed the importance of lower extremity support stockings, she has been tolerating lower extremity wraps and is unable to manage support stockings.  I planned follow-up in 6 months.  She was seen by Dr. Waylan Haggard 04/19/2022.  She has been diagnosed with chronic respiratory failure with hypoxia.  She was continued on 2 L of oxygen.  It was felt that her chronic hypoxic respiratory failure is due to obesity and CHF.  She underwent sleep study which showed severe OSA with moderate oxygen desaturation ration.  It was felt this was secondary to obesity hypoventilation syndrome.  BiPAP titration study was recommended.  She presented to the clinic 06/21/22 for follow-up evaluation and stated she continued to see pulmonology and endocrinology.  She reported was given samples of Ozempic .  We discussed the medication.  I recommended that she start the medication to help with her comorbidities and weight loss.  She was on 3 L nasal cannula.  Her weight was up to 377 pounds.  She had bilateral lower extremity 1+ pitting edema.  Lungs were clear to auscultation.  She reported compliance with her medications.  I  refered to wound care for evaluation and recommend bilateral Unna boots.  I  ordered fasting lipids and LFTs and planned follow-up in 6 months.   She presents to the clinic today for follow-up evaluation and states***.   Today she denies chest pain, increased shortness of breath, increased lower extremity edema, fatigue, palpitations, melena, hematuria, hemoptysis, diaphoresis, weakness, presyncope, syncope, orthopnea, and PND.   Chronic diastolic CHF-weight today 37***7 pounds.    Chronic dyspnea with increased physical activity.  She was seen and evaluated by pulmonology who diagnosed chronic hypoxic respiratory failure.  Breathing stable. Continue current medical therapy Low-sodium diet-reviewed Chair exercises given Daily weights-contact office with any increase of 3 pounds overnight or 5 pounds in 1 week-again reviewed Continue lower extremity support stockings   Coronary artery disease- continues with nonexertional chest discomfort that is atypical in nature.  Discomfort continues to appear to be musculoskeletal in nature. She had cardiac catheterization 03/28/2020 which showed LAD lesions x2.  She received mid LAD PCI and DES x1.  Details above. Continue aspirin ,  rosuvastatin , metoprolol  Heart healthy low-sodium diet   Fatigue/DOE-unchanged.  Continues with 2***L of oxygen.  Secondary to chronic hypoxic respiratory failure and body habitus Continue 2 L of O2 Follows with pulmonology   Essential hypertension-BP today 106/6***0. Continue metoprolol , spironolactone  Maintain blood pressure log Heart healthy low-sodium diet   Hyperlipidemia-LDL 48 on 05/31/21.  Continue rosuvastatin  Heart healthy low-sodium high-fiber diet Increase physical activity as tolerated Repeat fasting lipids and LFTs    Type 2 diabetes- glucose 100 on 11/01/2022.   Continue current medical therapy Follows with PCP   Morbid obesity-weight today 37***7 pounds.  Continues on semaglutide .  She is limited with her  mobility due to low back pain and lower extremity numbness. Continue weight loss Reduced calorie diet Follows with PCP   Disposition: Follow-up with Dr. Audery Blazing in 6-9 months.   Past Medical History:  Diagnosis Date   CAD (coronary artery disease)    a. s/p NSTEMI, LHC 03/2020 dRCA 25%, m-LAD-1 95%-0% (DES), mLAD-2 50%, LVEDP moderately elevated, 27 mmHg.   Chronic diastolic heart failure (HCC)    a. LHC/echo 3/22 c/ elevated LVEDP, EF 50-55%, hypokinesis; b. Echo 10/22  EF 60-65%, mild LVH, borderline dilatation of ascending aorta, 39mm.   DDD (degenerative disc disease), lumbar    Diabetes mellitus without complication (HCC)    GERD (gastroesophageal reflux disease)    Hyperlipidemia    Hypertension    Hypothyroidism    Lumbar herniated disc    Major depressive disorder    Morbid obesity (HCC)     Past Surgical History:  Procedure Laterality Date   CORONARY STENT INTERVENTION N/A 03/28/2020   Procedure: CORONARY STENT INTERVENTION;  Surgeon: Lucendia Rusk, MD;  Location: [facility] INVASIVE CV LAB;  Service: Cardiovascular;  Laterality: N/A;   DILATION AND CURETTAGE OF UTERUS     INCISION AND DRAINAGE PERIRECTAL ABSCESS  06/21/2014   INCISION AND DRAINAGE PERIRECTAL ABSCESS Bilateral 06/21/2014   Procedure: IRRIGATION AND DEBRIDEMENT PERIRECTAL ABSCESS;  Surgeon: Oza Blumenthal, MD;  Location: [facility] OR;  Service: General;  Laterality: Bilateral;   LEFT HEART CATH AND CORONARY ANGIOGRAPHY  N/A 03/28/2020   Procedure: LEFT HEART CATH AND CORONARY ANGIOGRAPHY;  Surgeon: Lucendia Rusk, MD;  Location: [facility] INVASIVE CV LAB;  Service: Cardiovascular;  Laterality: N/A;    Current Medications: No outpatient medications have been marked as taking for the 06/20/23 encounter (Appointment) with Carie Charity, NP.     Allergies:   Irbesartan , Clindamycin/lincomycin, Latex, and Ultram [tramadol]   Social History   Socioeconomic History   Marital status: Single    Spouse name: Not on file   Number of children: Not on file   Years of education: Not on file   Highest education level: Not on file  Occupational History   Not on file  Tobacco Use   Smoking status: Never    Passive exposure: Past   Smokeless tobacco: Never  Vaping Use   Vaping status: Never Used  Substance and Sexual Activity   Alcohol use: No   Drug use: No   Sexual activity: Not on file  Other Topics Concern   Not on file  Social History Narrative   Not on file   Social Drivers of  Health   Financial Resource Strain: Low Risk  (04/22/2023)   Received from [facility]   Overall Financial Resource Strain (CARDIA)    Difficulty of Paying Living Expenses: Not hard at all  Food Insecurity: No Food Insecurity (04/22/2023)   Received from [facility]   Hunger Vital Sign    Worried About Running Out of Food in the Last Year: Never true    Ran Out of Food in the Last Year: Never true  Transportation Needs: No Transportation Needs (04/22/2023)   Received from [facility] - Transportation    Lack of Transportation (Medical): No    Lack of Transportation (Non-Medical): No  Physical Activity: Inactive (03/29/2020)   Exercise Vital Sign    Days of Exercise per Week: 0 days    Minutes of Exercise per Session: 0 min  Stress: Not on file  Social Connections: Unknown (05/29/2021)   Received from [facility]   Social Network    Social Network: Not on file     Family History: The patient's family history includes Diabetes in her father and mother. There is no history of Heart disease.  ROS:   Please see the history of present illness.     All other systems reviewed and are negative.   Risk Assessment/Calculations:           Physical Exam:    VS:  There were no vitals taken for this visit.    Wt Readings from Last 3 Encounters:  04/23/23 (!) 360 lb 9.6 oz (163.6 kg)  11/01/22 (!) 394 lb 12.8 oz (179.1 kg)  06/21/22 (!) 377 lb (171 kg)     GEN:  Well nourished, well developed in no acute distress HEENT: Normal NECK: No JVD; No carotid bruits LYMPHATICS: No lymphadenopathy CARDIAC: RRR, no murmurs, rubs, gallops bilateral lower extremity 1+ pitting edema RESPIRATORY:  Clear to auscultation without rales, wheezing or rhonchi  ABDOMEN: Soft, non-tender, non-distended MUSCULOSKELETAL:  No edema; No deformity  SKIN: Warm and dry.  Left lower extremity intact blisters.  No erythema NEUROLOGIC:  Alert and oriented x 3 PSYCHIATRIC:   Normal affect    EKGs/Labs/Other Studies Reviewed:    EKG 06/21/2022.  Shows normal sinus rhythm right bundle branch block left anterior fascicular block 68 bpm.  The following studies were reviewed today:  Echocardiogram 03/28/2020 IMPRESSIONS  1. Overall poor image quality even with definity  Septal and apical  hypokinesis . Left ventricular ejection fraction, by estimation, is 50 to  55%. The left ventricle has low normal function. The left ventricle has no  regional wall motion abnormalities.  Left ventricular diastolic parameters were normal.   2. Right ventricular systolic function is normal. The right ventricular  size is normal.   3. Left atrial size was mildly dilated.   4. The mitral valve is normal in structure. No evidence of mitral valve  regurgitation. No evidence of mitral stenosis.   5. The aortic valve was not well visualized. Aortic valve regurgitation  is not visualized. No aortic stenosis is present.   6. The inferior vena cava is normal in size with greater than 50%  respiratory variability, suggesting right atrial pressure of 3 mmHg.   Echocardiogram 11/10/2020  IMPRESSIONS     1. Left ventricular ejection fraction, by estimation, is 60 to 65%. The  left ventricle has normal function. The left ventricle has no regional  wall motion abnormalities. The left ventricular internal cavity size was  mildly dilated. There is mild left  ventricular hypertrophy. Left ventricular diastolic parameters were  normal.   2. Right ventricular systolic function is normal. The right ventricular  size is normal.   3. The mitral valve is normal in structure. Trivial mitral valve  regurgitation. No evidence of mitral stenosis.   4. The aortic valve is normal in structure. Aortic valve regurgitation is  not visualized. No aortic stenosis is present.   5. Aortic dilatation noted. There is borderline dilatation of the  ascending aorta, measuring 39 mm.   6. The inferior  vena cava is normal in size with greater than 50%  respiratory variability, suggesting right atrial pressure of 3 mmHg.   Comparison(s): EF 50 %, hypokinesis.  Cardiac catheterization 03/28/2020 Dist RCA lesion is 25% stenosed. Mid LAD-1 lesion is 95% stenosed. A drug-eluting stent was successfully placed using a STENT RESOLUTE ONYX [id number]. The proximal stent was postdilated with a 3.75 mm [place] baloon. Post intervention, there is a 0% residual stenosis. Mid LAD-2 lesion is 50% stenosed. LV end diastolic pressure is moderately elevated. LVEDP 27 mm Hg. There is no aortic valve stenosis.   Continue dual antiplatelet therapy for 12 months. Consider clopidogrel  monotherapy after 12 months, along with aggressive secondary prevention.     Increase statin to high potency.     Continue with plans for diuresis.  D/w Dr. Audery Blazing.  Diagnostic Dominance: Right     Intervention          EKG: ***  Recent Labs: 11/01/2022: BUN 27; Creatinine, Ser 0.93; Potassium 4.0; Sodium 142; TSH 3.57  Recent Lipid Panel    Component Value Date/Time   CHOL 121 05/31/2021 1220   TRIG 175.0 (H) 05/31/2021 1220   HDL 38.60 (L) 05/31/2021 1220   CHOLHDL 3 05/31/2021 1220   VLDL 35.0 05/31/2021 1220   LDLCALC 48 05/31/2021 1220    ASSESSMENT & PLAN   1.***         Signed, Carie Charity, NP  06/18/2023 6:20 PM      Notice: This dictation was prepared with Dragon dictation along with smaller phrase technology. Any transcriptional errors that result from this process are unintentional and may not be corrected upon review.  I spent 15*** minutes examining this patient, reviewing medications, and using patient centered shared decision making involving her cardiac care.   I spent 20 minutes reviewing  past  medical history,  medications, and prior cardiac tests.

## 2023-06-20 ENCOUNTER — Other Ambulatory Visit: Payer: Self-pay | Admitting: "Endocrinology

## 2023-06-20 ENCOUNTER — Telehealth: Payer: Self-pay | Admitting: General Practice

## 2023-06-20 ENCOUNTER — Ambulatory Visit: Admitting: General Practice

## 2023-06-20 DIAGNOSIS — E1165 Type 2 diabetes mellitus with hyperglycemia: Secondary | ICD-10-CM

## 2023-06-20 MED ORDER — POTASSIUM CHLORIDE ER 10 MEQ PO TBCR
20.0000 meq | EXTENDED_RELEASE_TABLET | Freq: Every day | ORAL | 0 refills | Status: DC
Start: 2023-06-20 — End: 2023-07-29

## 2023-06-20 NOTE — Telephone Encounter (Signed)
*  STAT* If patient is at the pharmacy, call can be transferred to refill team.   1. Which medications need to be refilled? (please list name of each medication and dose if known) potassium chloride  (KLOR-CON ) 10 MEQ tablet     4. Which pharmacy/location (including street and city if local pharmacy) is medication to be sent to?  [id number] -  ([place]), [place] - [address]     5. Do they need a 30 day or 90 day supply? 90

## 2023-06-20 NOTE — Telephone Encounter (Signed)
 Pt's medication was sent to pt's pharmacy as requested. Confirmation received.

## 2023-06-22 ENCOUNTER — Encounter (HOSPITAL_BASED_OUTPATIENT_CLINIC_OR_DEPARTMENT_OTHER): Admitting: Pulmonary Disease

## 2023-06-24 NOTE — Telephone Encounter (Signed)
 Requested Prescriptions   Pending Prescriptions Disp Refills   metFORMIN  (GLUCOPHAGE -XR) 500 MG 24 hr tablet [Pharmacy Med Name: metFORMIN  HCl ER 500 MG Oral Tablet Extended Release 24 Hour] 360 tablet 0    Sig: TAKE 4 TABLETS BY MOUTH WITH BREAKFAST

## 2023-06-25 ENCOUNTER — Other Ambulatory Visit: Payer: Self-pay | Admitting: "Endocrinology

## 2023-06-25 DIAGNOSIS — E1165 Type 2 diabetes mellitus with hyperglycemia: Secondary | ICD-10-CM

## 2023-06-25 NOTE — Telephone Encounter (Signed)
 Requested Prescriptions   Pending Prescriptions Disp Refills   HUMULIN R U-500 KWIKPEN 500 UNIT/ML KwikPen [Pharmacy Med Name: HumuLIN R U-500 KwikPen 500 UNIT/ML Subcutaneous Solution Pen-injector] 12 mL 0    Sig: INJECT 80 UNITS SUBCUTANEOUSLY TWICE DAILY WITH A MEAL

## 2023-06-28 ENCOUNTER — Other Ambulatory Visit: Payer: Self-pay | Admitting: Cardiology

## 2023-06-30 ENCOUNTER — Ambulatory Visit: Admitting: "Endocrinology

## 2023-07-07 NOTE — Progress Notes (Deleted)
 Cardiology Office Note:    Date:  07/07/2023   ID:  [name], DOB 1970/07/13, MRN [id number]  PCP:  Maryln Sober, PA-C   [facility] Providers Cardiologist:  Alexandria Angel, MD      Referring MD: Yvonnie Heritage, FNP   Follow-up evaluation of her acute on chronic diastolic CHF, and CAD.  History of Present Illness:    [name] is a 53 y.o. female with a hx of hypertension, GERD, depression, hypothyroidism, morbid obesity, uncontrolled type 2 diabetes, and chronic diastolic CHF.   She was admitted to hospital with chest discomfort and on 03/28/2020.  Her high-sensitivity troponins were elevated at 514 and climbed to over 2000.  Her EKG showed nonspecific changes.  She was diagnosed with NSTEMI and taken to the Cath Lab.  Cardiac catheterization showed 25% distal RCA, mid LAD 95%, mid LAD-to 50%, moderately elevated LVEDP at 27 mmHg.  She received PCI with DES x1 to her mid LAD.  Her echocardiogram at that time showed normal LVEF, septal/apical hypokinesis, normal diastolic function, no valvular abnormalities and normal right ventricular function.   She followed up with heart and vascular transition of care clinic 04/06/2020.  At that time she reported she felt achy since being discharged from the hospital.  She denied chest pain except with deep inhalation.  Her discomfort resolved with exhalation.  She reported overall feeling better.  Her medications were reviewed.  It was noted that her pillbox was not in order, containing days with missing doses of certain medications and multiple doses on other days.  She reported having very poor vision and having difficulty with her pill bottles.   She presented to the clinic 06/06/20 for follow-up evaluation stated she felt fairly well.  She did report that she had increased anxiety related to doctors visits and medical procedures.  She ha noticed occasional brief episodes of sharp chest pain.  She reported that the pain could be  reproduced with pressing over the area.  I educated her that this was related to muscle wall pain.  She expressed understanding.  She reported that she did have occasional brief episodes of palpitations.  She reported that these would happen a couple times a week.  She also reported that she had presented to the emergency department with complaints of lower extremity swelling.  She was instructed to take extra doses of her furosemide .  She reported that she followed a low-sodium diet and restricted her fluid.  She was breathing much better with 2 L of oxygen via nasal cannula.  We gave  her the [place] support stocking sheet, salty 6 diet sheet, have her increase her physical activity as tolerated, continue current medication regimen and order a BMP.   Follow-up was planned for 6 months with Dr. Audery Blazing.  She presented to the clinic 10/18/20 for follow-up evaluation stated she had noticed that she was more fatigued over the last several months.  She noticed that she continued to need her oxygen.  She reported that when she was asleep at night and her oxygen would fall off her saturation will drop into the 70s.   She was maintaining a heart healthy low-sodium diet.  She reported that she did have some lower extremity numbness and that she had a herniated lumbar disc.  She continued to have some right-sided/shoulder area discomfort as well as left-sided shoulder discomfort.  Appeared to be related to deconditioning and musculoskeletal pain related to the use of her walker.  Her weight had increased  about 10-11 pounds.  She denied  bleeding issues.  She performed a walking test without her oxygen and her oxygen saturation dropped to 87 after only ambulating 10-12 feet.  I ordered a sleep study, sent my note to her insurance company so that she may maintain her oxygen therapy, asked to her increase her physical activity, repeated an echocardiogram, and planned follow-up after her sleep study.  We reviewed the  importance of weight loss and increasing physical activity.  I recommended that she start doing chair type exercises multiple times per day.  She was seen in follow-up by Dr. Audery Blazing on 03/28/2021.  Her echocardiogram 10/22 showed normal LV function, mid left ventricular enlargement, mild left ventricular hypertrophy.  She reported multiple complaints.  She noted dyspnea on exertion.  She described bilateral lower extremity edema which was worse with her legs down.  She noted occasional pain in her right chest that was unlike her previous infarct pain.  She denied syncope.  Her Lasix  was continued.  She was instructed to follow-up with pulmonology for her chronic dyspnea/respiratory failure and for possible sleep apnea.  Her rosuvastatin  was increased to 40 mg daily with plan to repeat lipids and LFTs in 8 weeks.  She was seen in the emergency department on 07/22/2021.  She complained of shortness of breath and chest discomfort.  She was also noted to have increased lower extremity fluid.  She was wearing 2.5 L nasal cannula and denied fever and chills.  She reported a cough x5 days which was nonproductive.  Her chest x-ray showed no acute cardiopulmonary disease.  Her high-sensitivity troponins were low and flat.  Her BNP was 50.  She received IV Lasix  in the emergency department and urinated around 2 L.  She reported that her breathing was significantly improved.  She was advised to continue her home furosemide .  She presented to the clinic 08/20/21 for follow-up evaluation stated she continued to use 2 L of oxygen while she was away from home and 2-1/2 L of oxygen at home.  She reported intermittent periods of shortness of breath with laying back.  We reviewed her most recent emergency department visit.  She expressed understanding.  She remained limited in her physical activity due to back pain.  She reported that she was trying to wean oxygen but was unable to titrate down.  We reviewed her echocardiogram  and her pumping function.  She continued to be compliant with fluid restriction.  We reviewed the importance of daily weights and contacting the office with a weight increase of 2 to 3 pounds overnight or 5 pounds in 1 week.  I refered her to pulmonology for evaluation of her lungs and recommendations for coming off oxygen/treatment.  I reviewed the importance of lower extremity support stockings, she has been tolerating lower extremity wraps and is unable to manage support stockings.  I planned follow-up in 6 months.  She was seen by Dr. Waylan Haggard 04/19/2022.  She has been diagnosed with chronic respiratory failure with hypoxia.  She was continued on 2 L of oxygen.  It was felt that her chronic hypoxic respiratory failure is due to obesity and CHF.  She underwent sleep study which showed severe OSA with moderate oxygen desaturation ration.  It was felt this was secondary to obesity hypoventilation syndrome.  BiPAP titration study was recommended.  She presented to the clinic 06/21/22 for follow-up evaluation and stated she continued to see pulmonology and endocrinology.  She reported was given samples of Ozempic .  We discussed the medication.  I recommended that she start the medication to help with her comorbidities and weight loss.  She was on 3 L nasal cannula.  Her weight was up to 377 pounds.  She had bilateral lower extremity 1+ pitting edema.  Lungs were clear to auscultation.  She reported compliance with her medications.  I  refered to wound care for evaluation and recommend bilateral Unna boots.  I  ordered fasting lipids and LFTs and planned follow-up in 6 months.   She presents to the clinic today for follow-up evaluation and states***.   Today she denies chest pain, increased shortness of breath, increased lower extremity edema, fatigue, palpitations, melena, hematuria, hemoptysis, diaphoresis, weakness, presyncope, syncope, orthopnea, and PND.   Chronic diastolic CHF-weight today 37***7 pounds.    Chronic dyspnea with increased physical activity.  She was seen and evaluated by pulmonology who diagnosed chronic hypoxic respiratory failure.  Breathing stable. Continue current medical therapy Low-sodium diet-reviewed Chair exercises given Daily weights-contact office with any increase of 3 pounds overnight or 5 pounds in 1 week-again reviewed Continue lower extremity support stockings   Coronary artery disease- continues with nonexertional chest discomfort that is atypical in nature.  Discomfort continues to appear to be musculoskeletal in nature. She had cardiac catheterization 03/28/2020 which showed LAD lesions x2.  She received mid LAD PCI and DES x1.  Details above. Continue aspirin ,  rosuvastatin , metoprolol  Heart healthy low-sodium diet   Fatigue/DOE-unchanged.  Continues with 2***L of oxygen.  Secondary to chronic hypoxic respiratory failure and body habitus Continue 2 L of O2 Follows with pulmonology   Essential hypertension-BP today 106/6***0. Continue metoprolol , spironolactone  Maintain blood pressure log Heart healthy low-sodium diet   Hyperlipidemia-LDL 48 on 05/31/21.  Continue rosuvastatin  Heart healthy low-sodium high-fiber diet Increase physical activity as tolerated Repeat fasting lipids and LFTs    Type 2 diabetes- glucose 100 on 11/01/2022.   Continue current medical therapy Follows with PCP   Morbid obesity-weight today 37***7 pounds.  Continues on semaglutide .  She is limited with her  mobility due to low back pain and lower extremity numbness. Continue weight loss Reduced calorie diet Follows with PCP   Disposition: Follow-up with Dr. Audery Blazing in 6-9 months.   Past Medical History:  Diagnosis Date   CAD (coronary artery disease)    a. s/p NSTEMI, LHC 03/2020 dRCA 25%, m-LAD-1 95%-0% (DES), mLAD-2 50%, LVEDP moderately elevated, 27 mmHg.   Chronic diastolic heart failure (HCC)    a. LHC/echo 3/22 c/ elevated LVEDP, EF 50-55%, hypokinesis; b. Echo 10/22  EF 60-65%, mild LVH, borderline dilatation of ascending aorta, 39mm.   DDD (degenerative disc disease), lumbar    Diabetes mellitus without complication (HCC)    GERD (gastroesophageal reflux disease)    Hyperlipidemia    Hypertension    Hypothyroidism    Lumbar herniated disc    Major depressive disorder    Morbid obesity (HCC)     Past Surgical History:  Procedure Laterality Date   CORONARY STENT INTERVENTION N/A 03/28/2020   Procedure: CORONARY STENT INTERVENTION;  Surgeon: Lucendia Rusk, MD;  Location: [facility] INVASIVE CV LAB;  Service: Cardiovascular;  Laterality: N/A;   DILATION AND CURETTAGE OF UTERUS     INCISION AND DRAINAGE PERIRECTAL ABSCESS  06/21/2014   INCISION AND DRAINAGE PERIRECTAL ABSCESS Bilateral 06/21/2014   Procedure: IRRIGATION AND DEBRIDEMENT PERIRECTAL ABSCESS;  Surgeon: Oza Blumenthal, MD;  Location: [facility] OR;  Service: General;  Laterality: Bilateral;   LEFT HEART CATH AND CORONARY ANGIOGRAPHY  N/A 03/28/2020   Procedure: LEFT HEART CATH AND CORONARY ANGIOGRAPHY;  Surgeon: Lucendia Rusk, MD;  Location: [facility] INVASIVE CV LAB;  Service: Cardiovascular;  Laterality: N/A;    Current Medications: No outpatient medications have been marked as taking for the 07/10/23 encounter (Appointment) with Carie Charity, NP.     Allergies:   Irbesartan , Clindamycin/lincomycin, Latex, and Ultram [tramadol]   Social History   Socioeconomic History   Marital status: Single    Spouse name: Not on file   Number of children: Not on file   Years of education: Not on file   Highest education level: Not on file  Occupational History   Not on file  Tobacco Use   Smoking status: Never    Passive exposure: Past   Smokeless tobacco: Never  Vaping Use   Vaping status: Never Used  Substance and Sexual Activity   Alcohol use: No   Drug use: No   Sexual activity: Not on file  Other Topics Concern   Not on file  Social History Narrative   Not on file   Social Drivers of  Health   Financial Resource Strain: Low Risk  (04/22/2023)   [facility]   Overall Financial Resource Strain (CARDIA)    Difficulty of Paying Living Expenses: Not hard at all  Food Insecurity: No Food Insecurity (04/22/2023)   Received from [facility]   Hunger Vital Sign    Worried About Running Out of Food in the Last Year: Never true    Ran Out of Food in the Last Year: Never true  Transportation Needs: No Transportation Needs (04/22/2023)   Received from [facility] - Transportation    Lack of Transportation (Medical): No    Lack of Transportation (Non-Medical): No  Physical Activity: Inactive (03/29/2020)   Exercise Vital Sign    Days of Exercise per Week: 0 days    Minutes of Exercise per Session: 0 min  Stress: Not on file  Social Connections: Unknown (05/29/2021)   Received from [facility]   Social Network    Social Network: Not on file     Family History: The patient's family history includes Diabetes in her father and mother. There is no history of Heart disease.  ROS:   Please see the history of present illness.     All other systems reviewed and are negative.   Risk Assessment/Calculations:           Physical Exam:    VS:  There were no vitals taken for this visit.    Wt Readings from Last 3 Encounters:  04/23/23 (!) 360 lb 9.6 oz (163.6 kg)  11/01/22 (!) 394 lb 12.8 oz (179.1 kg)  06/21/22 (!) 377 lb (171 kg)     GEN:  Well nourished, well developed in no acute distress HEENT: Normal NECK: No JVD; No carotid bruits LYMPHATICS: No lymphadenopathy CARDIAC: RRR, no murmurs, rubs, gallops bilateral lower extremity 1+ pitting edema RESPIRATORY:  Clear to auscultation without rales, wheezing or rhonchi  ABDOMEN: Soft, non-tender, non-distended MUSCULOSKELETAL:  No edema; No deformity  SKIN: Warm and dry.  Left lower extremity intact blisters.  No erythema NEUROLOGIC:  Alert and oriented x 3 PSYCHIATRIC:   Normal affect    EKGs/Labs/Other Studies Reviewed:    EKG 06/21/2022.  Shows normal sinus rhythm right bundle branch block left anterior fascicular block 68 bpm.  The following studies were reviewed today:  Echocardiogram 03/28/2020 IMPRESSIONS  1. Overall poor image quality even with definity  Septal and apical  hypokinesis . Left ventricular ejection fraction, by estimation, is 50 to  55%. The left ventricle has low normal function. The left ventricle has no  regional wall motion abnormalities.  Left ventricular diastolic parameters were normal.   2. Right ventricular systolic function is normal. The right ventricular  size is normal.   3. Left atrial size was mildly dilated.   4. The mitral valve is normal in structure. No evidence of mitral valve  regurgitation. No evidence of mitral stenosis.   5. The aortic valve was not well visualized. Aortic valve regurgitation  is not visualized. No aortic stenosis is present.   6. The inferior vena cava is normal in size with greater than 50%  respiratory variability, suggesting right atrial pressure of 3 mmHg.   Echocardiogram 11/10/2020  IMPRESSIONS     1. Left ventricular ejection fraction, by estimation, is 60 to 65%. The  left ventricle has normal function. The left ventricle has no regional  wall motion abnormalities. The left ventricular internal cavity size was  mildly dilated. There is mild left  ventricular hypertrophy. Left ventricular diastolic parameters were  normal.   2. Right ventricular systolic function is normal. The right ventricular  size is normal.   3. The mitral valve is normal in structure. Trivial mitral valve  regurgitation. No evidence of mitral stenosis.   4. The aortic valve is normal in structure. Aortic valve regurgitation is  not visualized. No aortic stenosis is present.   5. Aortic dilatation noted. There is borderline dilatation of the  ascending aorta, measuring 39 mm.   6. The inferior  vena cava is normal in size with greater than 50%  respiratory variability, suggesting right atrial pressure of 3 mmHg.   Comparison(s): EF 50 %, hypokinesis.  Cardiac catheterization 03/28/2020 Dist RCA lesion is 25% stenosed. Mid LAD-1 lesion is 95% stenosed. A drug-eluting stent was successfully placed using a STENT RESOLUTE ONYX [id number]. The proximal stent was postdilated with a 3.75 mm [place] baloon. Post intervention, there is a 0% residual stenosis. Mid LAD-2 lesion is 50% stenosed. LV end diastolic pressure is moderately elevated. LVEDP 27 mm Hg. There is no aortic valve stenosis.   Continue dual antiplatelet therapy for 12 months. Consider clopidogrel  monotherapy after 12 months, along with aggressive secondary prevention.     Increase statin to high potency.     Continue with plans for diuresis.  D/w Dr. Audery Blazing.  Diagnostic Dominance: Right     Intervention          EKG: ***  Recent Labs: 11/01/2022: BUN 27; Creatinine, Ser 0.93; Potassium 4.0; Sodium 142; TSH 3.57  Recent Lipid Panel    Component Value Date/Time   CHOL 121 05/31/2021 1220   TRIG 175.0 (H) 05/31/2021 1220   HDL 38.60 (L) 05/31/2021 1220   CHOLHDL 3 05/31/2021 1220   VLDL 35.0 05/31/2021 1220   LDLCALC 48 05/31/2021 1220    ASSESSMENT & PLAN   1.***         Signed, Carie Charity, NP  07/07/2023 12:58 PM      Notice: This dictation was prepared with Dragon dictation along with smaller phrase technology. Any transcriptional errors that result from this process are unintentional and may not be corrected upon review.  I spent 15*** minutes examining this patient, reviewing medications, and using patient centered shared decision making involving her cardiac care.   I spent 20 minutes reviewing  past  medical history,  medications, and prior cardiac tests.

## 2023-07-08 ENCOUNTER — Telehealth: Payer: Self-pay

## 2023-07-08 NOTE — Telephone Encounter (Signed)
 Pharmacy Patient Advocate Encounter   Received notification from CoverMyMeds that prior authorization for Freestyle libre 3 plus is required/requested.   Insurance verification completed.   The patient is insured through [facility] .   Per test claim: PA required; PA submitted to above mentioned insurance via CoverMyMeds Key/confirmation #/EOC [id number] Status is pending

## 2023-07-10 ENCOUNTER — Ambulatory Visit: Admitting: General Practice

## 2023-07-11 ENCOUNTER — Telehealth: Payer: Self-pay | Admitting: *Deleted

## 2023-07-11 NOTE — Telephone Encounter (Signed)
 Called Patient and left voicemail to discuss her last scheduled appointment.   Buckley Card RN, BSN

## 2023-07-16 ENCOUNTER — Other Ambulatory Visit (HOSPITAL_COMMUNITY): Payer: Self-pay

## 2023-07-16 NOTE — Telephone Encounter (Signed)
 Pharmacy Patient Advocate Encounter  Received notification from [facility] that Prior Authorization for [facility] 3 plus  has been APPROVED from 07/08/2023 to 01/07/2024. Ran test claim, Copay is $0.00. This test claim was processed through [facility]- copay amounts may vary at other pharmacies due to pharmacy/plan contracts, or as the patient moves through the different stages of their insurance plan.   PA #/Case ID/Reference #: [id number]

## 2023-07-21 ENCOUNTER — Other Ambulatory Visit: Payer: Self-pay | Admitting: "Endocrinology

## 2023-07-21 DIAGNOSIS — E1165 Type 2 diabetes mellitus with hyperglycemia: Secondary | ICD-10-CM

## 2023-07-21 NOTE — Telephone Encounter (Signed)
 Requested Prescriptions   Pending Prescriptions Disp Refills   HUMULIN R U-500 KWIKPEN 500 UNIT/ML KwikPen [Pharmacy Med Name: HumuLIN R U-500 KwikPen 500 UNIT/ML Subcutaneous Solution Pen-injector] 12 mL 0    Sig: INJECT 80 UNITS SUBCUTANEOUSLY TWICE DAILY WITH A MEAL

## 2023-07-23 ENCOUNTER — Ambulatory Visit: Admitting: "Endocrinology

## 2023-07-27 ENCOUNTER — Other Ambulatory Visit: Payer: Self-pay | Admitting: Cardiology

## 2023-08-11 ENCOUNTER — Other Ambulatory Visit: Payer: Self-pay | Admitting: Cardiology

## 2023-08-15 ENCOUNTER — Encounter (HOSPITAL_BASED_OUTPATIENT_CLINIC_OR_DEPARTMENT_OTHER): Admitting: Pulmonary Disease

## 2023-08-24 ENCOUNTER — Other Ambulatory Visit: Payer: Self-pay | Admitting: Cardiology

## 2023-08-24 NOTE — Progress Notes (Unsigned)
 Cardiology Clinic Note   Patient Name: [name] Date of Encounter: 08/25/2023  Primary Care Provider:  Shepperson, Kirstin, PA-C Primary Cardiologist:  Redell Shallow, MD  Patient Profile    [name] 53 year old female presents to the clinic today for follow-up evaluation of her chronic diastolic CHF and coronary artery disease.  Past Medical History    Past Medical History:  Diagnosis Date   CAD (coronary artery disease)    a. s/p NSTEMI, LHC 03/2020 dRCA 25%, m-LAD-1 95%-0% (DES), mLAD-2 50%, LVEDP moderately elevated, 27 mmHg.   Chronic diastolic heart failure (HCC)    a. LHC/echo 3/22 c/ elevated LVEDP, EF 50-55%, hypokinesis; b. Echo 10/22 EF 60-65%, mild LVH, borderline dilatation of ascending aorta, 39mm.   DDD (degenerative disc disease), lumbar    Diabetes mellitus without complication (HCC)    GERD (gastroesophageal reflux disease)    Hyperlipidemia    Hypertension    Hypothyroidism    Lumbar herniated disc    Major depressive disorder    Morbid obesity (HCC)    Past Surgical History:  Procedure Laterality Date   CORONARY STENT INTERVENTION N/A 03/28/2020   Procedure: CORONARY STENT INTERVENTION;  Surgeon: Dann Candyce RAMAN, MD;  Location: [facility] INVASIVE CV LAB;  Service: Cardiovascular;  Laterality: N/A;   DILATION AND CURETTAGE OF UTERUS     INCISION AND DRAINAGE PERIRECTAL ABSCESS  06/21/2014   INCISION AND DRAINAGE PERIRECTAL ABSCESS Bilateral 06/21/2014   Procedure: IRRIGATION AND DEBRIDEMENT PERIRECTAL ABSCESS;  Surgeon: Vicenta Poli, MD;  Location: [facility] OR;  Service: General;  Laterality: Bilateral;   LEFT HEART CATH AND CORONARY ANGIOGRAPHY N/A 03/28/2020   Procedure: LEFT HEART CATH AND CORONARY ANGIOGRAPHY;  Surgeon: Dann Candyce RAMAN, MD;  Location: [facility] INVASIVE CV LAB;  Service: Cardiovascular;  Laterality: N/A;    Allergies  Allergies  Allergen Reactions   Irbesartan  Other (See Comments)    Dizziness, muscle pain    Clindamycin/Lincomycin Other (See Comments) and Nausea And Vomiting    Stomach pain    Latex Rash   Ultram [Tramadol] Rash    History of Present Illness    Whitlee Bouyer is a 53 y.o. female with a hx of hypertension, GERD, depression, hypothyroidism, morbid obesity, uncontrolled type 2 diabetes, and chronic diastolic CHF.   She was admitted to hospital with chest discomfort and on 03/28/2020.  Her high-sensitivity troponins were elevated at 514 and climbed to over 2000.  Her EKG showed nonspecific changes.  She was diagnosed with NSTEMI and taken to the Cath Lab.  Cardiac catheterization showed 25% distal RCA, mid LAD 95%, mid LAD-to 50%, moderately elevated LVEDP at 27 mmHg.  She received PCI with DES x1 to her mid LAD.  Her echocardiogram at that time showed normal LVEF, septal/apical hypokinesis, normal diastolic function, no valvular abnormalities and normal right ventricular function.   She followed up with heart and vascular transition of care clinic 04/06/2020.  At that time she reported she felt achy since being discharged from the hospital.  She denied chest pain except with deep inhalation.  Her discomfort resolved with exhalation.  She reported overall feeling better.  Her medications were reviewed.  It was noted that her pillbox was not in order, containing days with missing doses of certain medications and multiple doses on other days.  She reported having very poor vision and having difficulty with her pill bottles.   She presented to the clinic 06/06/20 for follow-up evaluation stated she felt fairly well.  She did report that  she had increased anxiety related to doctors visits and medical procedures.  She ha noticed occasional brief episodes of sharp chest pain.  She reported that the pain could be reproduced with pressing over the area.  I educated her that this was related to muscle wall pain.  She expressed understanding.  She reported that she did have occasional brief episodes of  palpitations.  She reported that these would happen a couple times a week.  She also reported that she had presented to the emergency department with complaints of lower extremity swelling.  She was instructed to take extra doses of her furosemide .  She reported that she followed a low-sodium diet and restricted her fluid.  She was breathing much better with 2 L of oxygen via nasal cannula.  We gave  her the [place] support stocking sheet, salty 6 diet sheet, have her increase her physical activity as tolerated, continue current medication regimen and order a BMP.   Follow-up was planned for 6 months with Dr. Pietro.   She presented to the clinic 10/18/20 for follow-up evaluation stated she had noticed that she was more fatigued over the last several months.  She noticed that she continued to need her oxygen.  She reported that when she was asleep at night and her oxygen would fall off her saturation will drop into the 70s.   She was maintaining a heart healthy low-sodium diet.  She reported that she did have some lower extremity numbness and that she had a herniated lumbar disc.  She continued to have some right-sided/shoulder area discomfort as well as left-sided shoulder discomfort.  Appeared to be related to deconditioning and musculoskeletal pain related to the use of her walker.  Her weight had increased about 10-11 pounds.  She denied  bleeding issues.  She performed a walking test without her oxygen and her oxygen saturation dropped to 87 after only ambulating 10-12 feet.  I ordered a sleep study, sent my note to her insurance company so that she may maintain her oxygen therapy, asked to her increase her physical activity, repeated an echocardiogram, and planned follow-up after her sleep study.  We reviewed the importance of weight loss and increasing physical activity.  I recommended that she start doing chair type exercises multiple times per day.   She was seen in follow-up by Dr. Pietro on  03/28/2021.  Her echocardiogram 10/22 showed normal LV function, mid left ventricular enlargement, mild left ventricular hypertrophy.  She reported multiple complaints.  She noted dyspnea on exertion.  She described bilateral lower extremity edema which was worse with her legs down.  She noted occasional pain in her right chest that was unlike her previous infarct pain.  She denied syncope.  Her Lasix  was continued.  She was instructed to follow-up with pulmonology for her chronic dyspnea/respiratory failure and for possible sleep apnea.  Her rosuvastatin  was increased to 40 mg daily with plan to repeat lipids and LFTs in 8 weeks.   She was seen in the emergency department on 07/22/2021.  She complained of shortness of breath and chest discomfort.  She was also noted to have increased lower extremity fluid.  She was wearing 2.5 L nasal cannula and denied fever and chills.  She reported a cough x5 days which was nonproductive.  Her chest x-ray showed no acute cardiopulmonary disease.  Her high-sensitivity troponins were low and flat.  Her BNP was 50.  She received IV Lasix  in the emergency department and urinated around 2 L.  She reported that her breathing was significantly improved.  She was advised to continue her home furosemide .   She presented to the clinic 08/20/21 for follow-up evaluation stated she continued to use 2 L of oxygen while she was away from home and 2-1/2 L of oxygen at home.  She reported intermittent periods of shortness of breath with laying back.  We reviewed her most recent emergency department visit.  She expressed understanding.  She remained limited in her physical activity due to back pain.  She reported that she was trying to wean oxygen but was unable to titrate down.  We reviewed her echocardiogram and her pumping function.  She continued to be compliant with fluid restriction.  We reviewed the importance of daily weights and contacting the office with a weight increase of 2 to 3  pounds overnight or 5 pounds in 1 week.  I refered her to pulmonology for evaluation of her lungs and recommendations for coming off oxygen/treatment.  I reviewed the importance of lower extremity support stockings, she has been tolerating lower extremity wraps and is unable to manage support stockings.  I planned follow-up in 6 months.   She was seen by Dr. Theophilus 04/19/2022.  She has been diagnosed with chronic respiratory failure with hypoxia.  She was continued on 2 L of oxygen.  It was felt that her chronic hypoxic respiratory failure is due to obesity and CHF.  She underwent sleep study which showed severe OSA with moderate oxygen desaturation ration.  It was felt this was secondary to obesity hypoventilation syndrome.  BiPAP titration study was recommended.   She presented to the clinic 06/21/22 for follow-up evaluation and stated she continued to see pulmonology and endocrinology.  She reported was given samples of Ozempic .  We discussed the medication.  I recommended that she start the medication to help with her comorbidities and weight loss.  She was on 3 L nasal cannula.  Her weight was up to 377 pounds.  She had bilateral lower extremity 1+ pitting edema.  Lungs were clear to auscultation.  She reported compliance with her medications.  I  refered to wound care for evaluation and recommend bilateral Unna boots.  I  ordered fasting lipids and LFTs and planned follow-up in 6 months.   She presents to the clinic today for follow-up evaluation and states she is doing fairly well.  She continues to be somewhat physically active at home doing like exercises and chair type exercises.  Her blood pressure is well-controlled today at 110/64.  She has not started Ozempic  due to concern for side effects.  We reviewed this.  I recommended that she start the medication.  Her weight today is 372 pounds.  This is down from 377 pounds when I saw her previously.  We reviewed her diastolic CHF and supplemental oxygen.   Currently in the office today she is using 1 L of oxygen.  She was concerned about using her home bottle of oxygen.  She reports that at home she is wearing 3 L through the day and 4 L at night.  She is following a low-sodium diet.  She does have some generalized bilateral lower extremity edema.  I will order CBC, CMP, fasting lipids today and plan follow-up in 6 to 9 months.  I will also refill her potassium and metoprolol .   Today she denies chest pain, increased shortness of breath, increased lower extremity edema, fatigue, palpitations, melena, hematuria, hemoptysis, diaphoresis, weakness, presyncope, syncope, orthopnea, and PND.  Home Medications    Prior to Admission medications   Medication Sig Start Date End Date Taking? Authorizing Provider  acetaminophen  (TYLENOL ) 500 MG tablet Take 1,000 mg by mouth every 6 (six) hours as needed for moderate pain or headache.    [provider]  aspirin  EC 81 MG tablet Take 81 mg by mouth daily. Swallow whole.    [provider]  B Complex-C (SUPER B COMPLEX PO) Take 1 tablet by mouth daily.    [provider]  Cholecalciferol (VITAMIN D3) 50 MCG (2000 UT) capsule Take 2,000 Units by mouth daily.    [provider]  Continuous Glucose Sensor (FREESTYLE LIBRE 3 PLUS SENSOR) MISC Inject 1 Device into the skin continuous. Change every 15 days 01/16/23   Dartha Ernst, MD  Continuous Glucose Sensor (FREESTYLE LIBRE 3 SENSOR) MISC APPLY 1 SENSOR ON UPPER ARM EVERY 14 DAYS FOR CONTINUOUS GLUCOSE MONITORING 09/16/22   Dartha Ernst, MD  DULoxetine  (CYMBALTA ) 30 MG capsule Take 30 mg by mouth daily. 06/21/22 06/21/26  [provider]  empagliflozin  (JARDIANCE ) 25 MG TABS tablet Take 1 tablet (25 mg total) by mouth daily before breakfast. 10/17/22   Dartha Ernst, MD  furosemide  (LASIX ) 40 MG tablet Take 1 tablet by mouth once daily 06/30/23   Pietro Redell RAMAN, MD  gabapentin  (NEURONTIN ) 300 MG capsule Take 2 capsules  by mouth 3 (three) times daily. 03/23/21   [provider]  HUMULIN  R U-500 KWIKPEN 500 UNIT/ML KwikPen INJECT 80 UNITS SUBCUTANEOUSLY TWICE DAILY WITH A MEAL 07/21/23   Motwani, Komal, MD  Insulin  Syringes, Disposable, U-100 1 ML MISC Use to inject insulin  daily 02/14/22   [place], MD  levothyroxine  (SYNTHROID , LEVOTHROID) 50 MCG tablet Take 50 mcg by mouth daily. 04/15/14   [provider]  metFORMIN  (GLUCOPHAGE -XR) 500 MG 24 hr tablet TAKE 4 TABLETS BY MOUTH WITH BREAKFAST 06/24/23   Motwani, Komal, MD  metoprolol  succinate (TOPROL -XL) 50 MG 24 hr tablet Take 1 tablet by mouth once daily 08/13/23   Pietro Redell RAMAN, MD  nystatin  cream (MYCOSTATIN ) Apply 1 application  topically 2 (two) times daily. Abdominal folds    [provider]  omeprazole (PRILOSEC) 40 MG capsule Take 40 mg by mouth 2 (two) times daily.    [provider]  OZEMPIC , 0.25 OR 0.5 MG/DOSE, 2 MG/3ML SOPN INJECT 0.25 MG SUBCUTANEOUSLY ONCE A WEEK 05/05/23   Motwani, Komal, MD  potassium chloride  (KLOR-CON ) 10 MEQ tablet Take 2 tablets by mouth once daily 07/29/23   Pietro Redell RAMAN, MD  RELION PEN NEEDLES 31G X 6 MM MISC USE 1  TWICE DAILY 01/06/23   Motwani, Komal, MD  rosuvastatin  (CRESTOR ) 40 MG tablet Take 40 mg by mouth daily. 03/18/21   [provider]  Sennosides 17.2 MG TABS Take 34.4 mg by mouth 3 (three) times a week. No set days    [provider]    Family History    Family History  Problem Relation Age of Onset   Diabetes Mother    Diabetes Father    Heart disease Neg Hx    She indicated that her mother is alive. She indicated that her father is alive. She indicated that her maternal grandmother is deceased. She indicated that her maternal grandfather is deceased. She indicated that her paternal grandmother is deceased. She indicated that her paternal grandfather is deceased. She indicated that the status of her neg hx is unknown.  Social History    Social  History  Socioeconomic History   Marital status: Single    Spouse name: Not on file   Number of children: Not on file   Years of education: Not on file   Highest education level: Not on file  Occupational History   Not on file  Tobacco Use   Smoking status: Never    Passive exposure: Past   Smokeless tobacco: Never  Vaping Use   Vaping status: Never Used  Substance and Sexual Activity   Alcohol use: No   Drug use: No   Sexual activity: Not on file  Other Topics Concern   Not on file  Social History Narrative   Not on file   Social Drivers of Health   Financial Resource Strain: Low Risk  (04/22/2023)   Received from [facility]   Overall Financial Resource Strain (CARDIA)    Difficulty of Paying Living Expenses: Not hard at all  Food Insecurity: No Food Insecurity (04/22/2023)   Received from [facility]   Hunger Vital Sign    Within the past 12 months, you worried that your food would run out before you got the money to buy more.: Never true    Within the past 12 months, the food you bought just didn't last and you didn't have money to get more.: Never true  Transportation Needs: No Transportation Needs (04/22/2023)   Received from [facility] - Transportation    Lack of Transportation (Medical): No    Lack of Transportation (Non-Medical): No  Physical Activity: Inactive (03/29/2020)   Exercise Vital Sign    Days of Exercise per Week: 0 days    Minutes of Exercise per Session: 0 min  Stress: Not on file  Social Connections: Unknown (05/29/2021)   Received from [facility]   Social Network    Social Network: Not on file  Intimate Partner Violence: Unknown (04/30/2021)   Received from [facility]   HITS    Physically Hurt: Not on file    Insult or Talk Down To: Not on file    Threaten Physical Harm: Not on file    Scream or Curse: Not on file     Review of Systems    General:  No chills, fever, night sweats or weight changes.  Cardiovascular:   No chest pain, dyspnea on exertion, edema, orthopnea, palpitations, paroxysmal nocturnal dyspnea. Dermatological: No rash, lesions/masses Respiratory: No cough, dyspnea Urologic: No hematuria, dysuria Abdominal:   No nausea, vomiting, diarrhea, bright red blood per rectum, melena, or hematemesis Neurologic:  No visual changes, wkns, changes in mental status. All other systems reviewed and are otherwise negative except as noted above.  Physical Exam    VS:  BP 110/64 (BP Location: Right Arm, Patient Position: Sitting, Cuff Size: Large)   Pulse 71   Ht 5' 6 (1.676 m)   Wt (!) 372 lb 6.4 oz (168.9 kg)   SpO2 93%   BMI 60.11 kg/m  , BMI Body mass index is 60.11 kg/m. GEN: Well nourished, well developed, in no acute distress. HEENT: normal. Neck: Supple, no JVD, carotid bruits, or masses. Cardiac: RRR, no murmurs, rubs, or gallops. No clubbing, cyanosis, bilateral lower extremity generalized edema.  Radials/DP/PT 2+ and equal bilaterally.  Respiratory:  Respirations regular and unlabored, clear to auscultation bilaterally. GI: Soft, nontender, nondistended, BS + x 4. MS: no deformity or atrophy. Skin: warm and dry, no rash.  Right shin multiple intact blisters, chronic Neuro:  Strength and sensation are intact. Psych: Normal affect.  Accessory Clinical Findings    Recent Labs: 11/01/2022: BUN 27; Creatinine, Ser 0.93; Potassium 4.0; Sodium 142; TSH 3.57   Recent Lipid Panel    Component Value Date/Time   CHOL 121 05/31/2021 1220   TRIG 175.0 (H) 05/31/2021 1220   HDL 38.60 (L) 05/31/2021 1220   CHOLHDL 3 05/31/2021 1220   VLDL 35.0 05/31/2021 1220   LDLCALC 48 05/31/2021 1220         ECG personally reviewed by me today- EKG Interpretation Date/Time:  Monday August 25 2023 09:12:56 EDT Ventricular Rate:  71 PR Interval:  168 QRS Duration:  154 QT Interval:  462 QTC Calculation: 502 R Axis:   -59  Text Interpretation: Normal sinus rhythm Right bundle branch block Left  anterior fascicular block Bifascicular block Confirmed by Emelia Hazy [phone]) on 08/25/2023 9:15:04 AM    EKG 06/21/2022.  Shows normal sinus rhythm right bundle branch block left anterior fascicular block 68 bpm.   The following studies were reviewed today:   Echocardiogram 03/28/2020 IMPRESSIONS     1. Overall poor image quality even with definity  Septal and apical  hypokinesis . Left ventricular ejection fraction, by estimation, is 50 to  55%. The left ventricle has low normal function. The left ventricle has no  regional wall motion abnormalities.  Left ventricular diastolic parameters were normal.   2. Right ventricular systolic function is normal. The right ventricular  size is normal.   3. Left atrial size was mildly dilated.   4. The mitral valve is normal in structure. No evidence of mitral valve  regurgitation. No evidence of mitral stenosis.   5. The aortic valve was not well visualized. Aortic valve regurgitation  is not visualized. No aortic stenosis is present.   6. The inferior vena cava is normal in size with greater than 50%  respiratory variability, suggesting right atrial pressure of 3 mmHg.   Echocardiogram 11/10/2020   IMPRESSIONS     1. Left ventricular ejection fraction, by estimation, is 60 to 65%. The  left ventricle has normal function. The left ventricle has no regional  wall motion abnormalities. The left ventricular internal cavity size was  mildly dilated. There is mild left  ventricular hypertrophy. Left ventricular diastolic parameters were  normal.   2. Right ventricular systolic function is normal. The right ventricular  size is normal.   3. The mitral valve is normal in structure. Trivial mitral valve  regurgitation. No evidence of mitral stenosis.   4. The aortic valve is normal in structure. Aortic valve regurgitation is  not visualized. No aortic stenosis is present.   5. Aortic dilatation noted. There is borderline dilatation of the   ascending aorta, measuring 39 mm.   6. The inferior vena cava is normal in size with greater than 50%  respiratory variability, suggesting right atrial pressure of 3 mmHg.   Comparison(s): EF 50 %, hypokinesis.   Cardiac catheterization 03/28/2020 Dist RCA lesion is 25% stenosed. Mid LAD-1 lesion is 95% stenosed. A drug-eluting stent was successfully placed using a STENT RESOLUTE ONYX [id number]. The proximal stent was postdilated with a 3.75 mm [place] baloon. Post intervention, there is a 0% residual stenosis. Mid LAD-2 lesion is 50% stenosed. LV end diastolic pressure is moderately elevated. LVEDP 27 mm Hg. There is no aortic valve stenosis.   Continue dual antiplatelet therapy for 12 months. Consider clopidogrel  monotherapy after 12 months, along with aggressive secondary prevention.     Increase statin to high potency.     Continue  with plans for diuresis.  D/w Dr. Pietro.  Diagnostic Dominance: Right     Intervention             Assessment & Plan   1.  Chronic diastolic CHF-weight today 372 pounds.   Chronic dyspnea with increased physical activity.  She was seen and evaluated by pulmonology who diagnosed chronic hypoxic respiratory failure.  Breathing stable.  Using 3 L of oxygen through the day and 4 L at night. Continue current medical therapy Low-sodium diet-reviewed Chair exercises given-rewiewed Daily weights-contact office with any increase of 3 pounds overnight or 5 pounds in 1 week-again reviewed Continue lower extremity support stockings, elevate lower extremities when not active  CBC, CMP  Coronary artery disease- continues with nonexertional chest discomfort that is atypical in nature.  Discomfort continues to appear to be musculoskeletal in nature. She had cardiac catheterization 03/28/2020 which showed LAD lesions x2.  She received mid LAD PCI and DES x1.  Details above. Continue aspirin ,  rosuvastatin , metoprolol  Heart healthy low-sodium diet    Fatigue/DOE-unchanged.  Continues with 3-4L of oxygen.  Secondary to chronic hypoxic respiratory failure and body habitus Continue supplemental oxygen Follows with pulmonology   Essential hypertension-BP today 110/64 Continue metoprolol , spironolactone  Maintain blood pressure log Heart healthy low-sodium diet   Hyperlipidemia-LDL 48 on 05/31/21.  Continue rosuvastatin  Heart healthy low-sodium high-fiber diet Increase physical activity as tolerated Repeat fasting lipids    Type 2 diabetes- glucose 100 on 11/01/2022.   Continue current medical therapy Follows with PCP   Morbid obesity-weight today 372 pounds.  Continues on semaglutide .  She is limited with her  mobility due to low back pain and lower extremity numbness. Continue weight loss Reduced calorie diet Follows with PCP   Disposition: Follow-up with Dr. Pietro in 6-9 months.   Josefa HERO. Nickoli Bagheri NP-C     08/25/2023, 9:36 AM [facility] [address] Office [phone] Fax [phone]    I spent 14 minutes examining this patient, reviewing medications, and using patient centered shared decision making involving their cardiac care.   I spent  20 minutes reviewing past medical history,  medications, and prior cardiac tests.

## 2023-08-25 ENCOUNTER — Ambulatory Visit: Attending: General Practice | Admitting: General Practice

## 2023-08-25 ENCOUNTER — Encounter: Payer: Self-pay | Admitting: General Practice

## 2023-08-25 VITALS — BP 110/64 | HR 71 | Ht 66.0 in | Wt 372.4 lb

## 2023-08-25 DIAGNOSIS — I2510 Atherosclerotic heart disease of native coronary artery without angina pectoris: Secondary | ICD-10-CM | POA: Insufficient documentation

## 2023-08-25 DIAGNOSIS — I5032 Chronic diastolic (congestive) heart failure: Secondary | ICD-10-CM | POA: Diagnosis not present

## 2023-08-25 DIAGNOSIS — E785 Hyperlipidemia, unspecified: Secondary | ICD-10-CM | POA: Insufficient documentation

## 2023-08-25 DIAGNOSIS — R5383 Other fatigue: Secondary | ICD-10-CM | POA: Diagnosis present

## 2023-08-25 DIAGNOSIS — R0609 Other forms of dyspnea: Secondary | ICD-10-CM | POA: Insufficient documentation

## 2023-08-25 DIAGNOSIS — E118 Type 2 diabetes mellitus with unspecified complications: Secondary | ICD-10-CM | POA: Diagnosis present

## 2023-08-25 DIAGNOSIS — I10 Essential (primary) hypertension: Secondary | ICD-10-CM | POA: Diagnosis not present

## 2023-08-25 LAB — LIPID PANEL

## 2023-08-25 MED ORDER — METOPROLOL SUCCINATE ER 50 MG PO TB24: 50.0000 mg | ORAL_TABLET | Freq: Every day | ORAL | 3 refills | Status: DC

## 2023-08-25 MED ORDER — POTASSIUM CHLORIDE ER 10 MEQ PO TBCR: 20.0000 meq | EXTENDED_RELEASE_TABLET | Freq: Every day | ORAL | 3 refills | Status: DC

## 2023-08-25 NOTE — Patient Instructions (Addendum)
 Medication Instructions:  No medication changes were made at this visit. Continue current regimen.   Refills for Potassium and Metoprolol  Succinate (Toprol -XL) sent to [facility].  *If you need a refill on your cardiac medications before your next appointment, please call your pharmacy*  Lab Work: To be completed today: CMP, CBC, and lipid panel  If you have labs (blood work) drawn today and your tests are completely normal, you will receive your results only by: MyChart Message (if you have MyChart) OR A paper copy in the mail If you have any lab test that is abnormal or we need to change your treatment, we will call you to review the results.  Testing/Procedures: None ordered today.  Follow-Up: At [facility], you and your health needs are our priority.  As part of our continuing mission to provide you with exceptional heart care, our providers are all part of one team.  This team includes your primary Cardiologist (physician) and Advanced Practice Providers or APPs (Physician Assistants and Nurse Practitioners) who all work together to provide you with the care you need, when you need it.  Your next appointment:   6-9 month(s)  Provider:   Josefa Beauvais, NP OR Dr. Pietro

## 2023-08-26 ENCOUNTER — Ambulatory Visit: Payer: Self-pay | Admitting: General Practice

## 2023-08-26 LAB — CBC
Hematocrit: 40.3 % (ref 34.0–46.6)
Hemoglobin: 13.1 g/dL (ref 11.1–15.9)
MCH: 29.5 pg (ref 26.6–33.0)
MCHC: 32.5 g/dL (ref 31.5–35.7)
MCV: 91 fL (ref 79–97)
Platelets: 196 x10E3/uL (ref 150–450)
RBC: 4.44 x10E6/uL (ref 3.77–5.28)
RDW: 14.2 % (ref 11.7–15.4)
WBC: 9.2 x10E3/uL (ref 3.4–10.8)

## 2023-08-26 LAB — LIPID PANEL
Cholesterol, Total: 101 mg/dL (ref 100–199)
HDL: 37 mg/dL — AB (ref >39)
LDL CALC COMMENT:: 2.7 ratio (ref 0.0–4.4)
LDL Chol Calc (NIH): 36 mg/dL (ref 0–99)
Triglycerides: 168 mg/dL — AB (ref 0–149)
VLDL Cholesterol Cal: 28 mg/dL (ref 5–40)

## 2023-08-26 LAB — COMPREHENSIVE METABOLIC PANEL WITH GFR
ALT: 21 IU/L (ref 0–32)
AST: 14 IU/L (ref 0–40)
Albumin: 4 g/dL (ref 3.8–4.9)
Alkaline Phosphatase: 98 IU/L (ref 44–121)
BUN/Creatinine Ratio: 20 (ref 9–23)
BUN: 26 mg/dL — ABNORMAL HIGH (ref 6–24)
Bilirubin Total: 0.9 mg/dL (ref 0.0–1.2)
CO2: 25 mmol/L (ref 20–29)
Calcium: 9.2 mg/dL (ref 8.7–10.2)
Chloride: 102 mmol/L (ref 96–106)
Creatinine, Ser: 1.32 mg/dL — ABNORMAL HIGH (ref 0.57–1.00)
Globulin, Total: 2.4 g/dL (ref 1.5–4.5)
Glucose: 101 mg/dL — ABNORMAL HIGH (ref 70–99)
Potassium: 4 mmol/L (ref 3.5–5.2)
Sodium: 145 mmol/L — ABNORMAL HIGH (ref 134–144)
Total Protein: 6.4 g/dL (ref 6.0–8.5)
eGFR: 49 mL/min/1.73 — ABNORMAL LOW (ref >59)

## 2023-09-03 ENCOUNTER — Ambulatory Visit: Admitting: "Endocrinology

## 2023-09-03 DIAGNOSIS — E1165 Type 2 diabetes mellitus with hyperglycemia: Secondary | ICD-10-CM

## 2023-09-15 ENCOUNTER — Other Ambulatory Visit: Payer: Self-pay | Admitting: Cardiology

## 2023-09-17 ENCOUNTER — Other Ambulatory Visit: Payer: Self-pay | Admitting: "Endocrinology

## 2023-09-17 DIAGNOSIS — E1165 Type 2 diabetes mellitus with hyperglycemia: Secondary | ICD-10-CM

## 2023-09-17 NOTE — Telephone Encounter (Signed)
 Requested Prescriptions   Pending Prescriptions Disp Refills   metFORMIN  (GLUCOPHAGE -XR) 500 MG 24 hr tablet [Pharmacy Med Name: metFORMIN  HCl ER 500 MG Oral Tablet Extended Release 24 Hour] 360 tablet 0    Sig: TAKE 4 TABLETS BY MOUTH WITH BREAKFAST

## 2023-09-18 ENCOUNTER — Ambulatory Visit: Admitting: "Endocrinology

## 2023-09-23 ENCOUNTER — Ambulatory Visit: Admitting: Pulmonary Disease

## 2023-09-23 ENCOUNTER — Encounter: Payer: Self-pay | Admitting: Pulmonary Disease

## 2023-10-08 ENCOUNTER — Other Ambulatory Visit: Payer: Self-pay | Admitting: "Endocrinology

## 2023-10-08 DIAGNOSIS — E1165 Type 2 diabetes mellitus with hyperglycemia: Secondary | ICD-10-CM

## 2023-10-09 NOTE — Telephone Encounter (Signed)
 Requested Prescriptions   Pending Prescriptions Disp Refills   insulin  regular human CONCENTRATED (HUMULIN  R U-500 KWIKPEN) 500 UNIT/ML KwikPen [Pharmacy Med Name: HumuLIN  R U-500 KwikPen 500 UNIT/ML Subcutaneous Solution Pen-injector] 12 mL 0    Sig: INJECT 140 UNITS INTO THE SKIN BEFORE BREAKFAST AND 140 UNITS BEFORE SUPPER 30 MINUTES BEFORE MEALS

## 2023-10-16 ENCOUNTER — Ambulatory Visit (HOSPITAL_BASED_OUTPATIENT_CLINIC_OR_DEPARTMENT_OTHER): Attending: Pulmonary Disease | Admitting: Pulmonary Disease

## 2023-10-16 DIAGNOSIS — G4733 Obstructive sleep apnea (adult) (pediatric): Secondary | ICD-10-CM | POA: Diagnosis present

## 2023-10-22 NOTE — Procedures (Signed)
 [facility] [address] [place], [place] [address] Tel: [phone]   Fax: [phone]  Titration Interpretation  Patient Name:  [name], [name] Study Date:  10/16/2023 Referring Physician:  LONNA CODER [phone]) %%startinterp%% Indications for Polysomnography The patient is a 53 year old Female who is 5' 6 and weighs 370.0 lbs. Her BMI equals 60.2.  A full night titration treatment study was performed. Sleep study 02/22/2022 Severe obstructive sleep apnea [AHI 77.3], moderate oxygen desaturation to 73%   Polysomnogram Data A full night polysomnogram recorded the standard physiologic parameters including EEG, EOG, EMG, EKG, nasal and oral airflow.  Respiratory parameters of chest and abdominal movements were recorded with Respiratory Inductance Plethysmography belts.  Oxygen saturation was recorded by pulse oximetry.   Sleep Architecture The total recording time of the polysomnogram was 392.8 minutes.  The total sleep time was 282.5 minutes.  The patient spent 16.6% of total sleep time in Stage N1, 49.4% in Stage N2, 8.7% in Stages N3, and 25.3% in REM.  Sleep latency was 15.4 minutes.  REM latency was 50.5 minutes.  Sleep Efficiency was 71.9%.  Wake after Sleep Onset time was 94.5 minutes.  Titration Summary The patient was titrated at pressures ranging from 9/5 cm/H20 with supplemental oxygen at O2: 4.00 up to 21/17/0* cm/H20 with supplemental oxygen at O2: 4.00.  The last pressure used in the study was 21/17/0* cm/H20 with supplemental oxygen at O2: 4 L/m.  Respiratory Events The polysomnogram revealed a presence of 55 obstructive, - central, and - mixed apneas resulting in an Apnea index of 11.7 events per hour.  There were 41 hypopneas (>=3% desaturation and/or arousal) resulting in an Apnea\Hypopnea Index (AHI >=3% desaturation and/or arousal) of 20.4 events per hour.  There were 28 hypopneas (>=4% desaturation) resulting in an Apnea\Hypopnea  Index (AHI >=4% desaturation) of 17.6 events per hour.  There were - Respiratory Effort Related Arousals resulting in a RERA index of - events per hour. The Respiratory Disturbance Index is 20.4 events per hour.  The snore index was - events per hour.  Mean oxygen saturation was 94.7%.  The lowest oxygen saturation during sleep was 76.0%.  Time spent <=88% oxygen saturation was 14.3 minutes (3.8%).  Limb Activity There were 37 limb movements recorded.  Of this total, 18 were classified as PLMs.  Of the PLMs, 3 were associated with arousals.  The Limb Movement index was 7.9 per hour while the PLM index was 3.8 per hour.  Cardiac Summary The average pulse rate was 70.5 bpm.  The minimum pulse rate was 65.0 bpm while the maximum pulse rate was 85.0 bpm.  Cardiac rhythm was normal/abnormal.  Comments: Events were corrected by 18/14 cm but no REM sleep noted at this level. 60 mins of REM sleep noted on final level of 21/17  Diagnosis: Severe OSA corrected by Bilevel with 4 L O2 blended in  Recommendations: Initiate Bilevel at 21/17 with medium full face mask. Alternatively autoBiPAP can be used for better tolerance 4 L O2 blended in for this study since that is what she uses at baseline. In the futuree, this need should be reassessed Close clinical follow up with compliance monitoring to optimize therapeutic efficiency Weight loss measures encouraged She should be cautioned against driving when sleepy & against medications with sedative side effects     This study was personally reviewed and electronically signed by: Jude Donning, MD Accredited Board Certified in Sleep Medicine

## 2023-10-24 ENCOUNTER — Ambulatory Visit: Payer: Self-pay | Admitting: Pulmonary Disease

## 2023-10-27 ENCOUNTER — Other Ambulatory Visit: Payer: Self-pay

## 2023-10-27 ENCOUNTER — Other Ambulatory Visit: Payer: Self-pay | Admitting: "Endocrinology

## 2023-10-27 DIAGNOSIS — G4733 Obstructive sleep apnea (adult) (pediatric): Secondary | ICD-10-CM

## 2023-10-27 DIAGNOSIS — E1165 Type 2 diabetes mellitus with hyperglycemia: Secondary | ICD-10-CM

## 2023-10-27 NOTE — Telephone Encounter (Addendum)
-----   Message from [facility] sent at 10/24/2023  2:16 PM EDT ----- Titration study confirms severe OSA As per recommendation please order BiPAP IPAP 21/EPAP 17 with 4 L oxygen ----- Message ----- From: Jude Harden GAILS, MD Sent: 10/22/2023   3:13 PM EDT To: Praveen Mannam, MD  Called regarding Bipap titration result. Patient did not answer, left a message to call back

## 2023-10-28 ENCOUNTER — Ambulatory Visit: Payer: Self-pay | Admitting: Pulmonary Disease

## 2023-10-28 NOTE — Telephone Encounter (Signed)
 Requested Prescriptions   Pending Prescriptions Disp Refills   HUMULIN  R U-500 KWIKPEN 500 UNIT/ML KwikPen [Pharmacy Med Name: HumuLIN  R U-500 KwikPen 500 UNIT/ML Subcutaneous Solution Pen-injector] 12 mL 0    Sig: INJECT 140 UNITS INTO THE SKIN BEFORE BREAKFAST AND 140 UNITS BEFORE SUPPER 30 MINUTES BEFORE MEALS

## 2023-10-28 NOTE — Telephone Encounter (Signed)
 FYI Only or Action Required?: FYI only for provider.  Patient is followed in Pulmonology for OSA, last seen on 04/23/2023 by Mannam, Praveen, MD.  Called Nurse Triage reporting Shortness of Breath.  Symptoms began about a month ago.  Interventions attempted: Home oxygen use.  Symptoms are: unchanged.  Triage Disposition: See PCP When Office is Open (Within 3 Days)  Patient/caregiver understands and will follow disposition?: Yes          Copied from CRM [id number]. Topic: Clinical - Red Word Triage >> Oct 28, 2023  8:45 AM Devaughn RAMAN wrote: Red Word that prompted transfer to Nurse Triage: Trouble breathing and out of breath when walking. Reason for Disposition  [1] MODERATE longstanding difficulty breathing (e.g., speaks in phrases, SOB even at rest, pulse 100-120) AND [2] SAME as normal  Answer Assessment - Initial Assessment Questions E2C2 Pulmonary Triage - Initial Assessment Questions Chief Complaint (e.g., cough, sob, wheezing, fever, chills, sweat or additional symptoms) *Go to specific symptom protocol after initial questions. Trouble breathing - SOB with exertion above baseline  How long have symptoms been present? X1 month  Have you tested for COVID or Flu? Note: If not, ask patient if a home test can be taken. If so, instruct patient to call back for positive results. No  MEDICINES:   Have you used any OTC meds to help with symptoms? No If yes, ask What medications? N/a  Have you used your inhalers/maintenance medication? No If yes, What medications? N/a  If inhaler, ask How many puffs and how often? Note: Review instructions on medication in the chart. N/a  OXYGEN: Do you wear supplemental oxygen? Yes If yes, How many liters are you supposed to use? 4L  Do you monitor your oxygen levels? No If yes, What is your reading (oxygen level) today? Endorses having one, but has not checked in a while  What is your usual oxygen saturation  reading?  (Note: Pulmonary O2 sats should be 90% or greater) N/a      1. RESPIRATORY STATUS: Describe your breathing? (e.g., wheezing, shortness of breath, unable to speak, severe coughing)      SOB 2. ONSET: When did this breathing problem begin?      See above 3. PATTERN Does the difficult breathing come and go, or has it been constant since it started?      Comes and goes with activity/exertion 4. SEVERITY: How bad is your breathing? (e.g., mild, moderate, severe)      [facility] does not appreciate audible SOB/wheezing during call. Pt is speaking in full sentences.  5. RECURRENT SYMPTOM: Have you had difficulty breathing before? If Yes, ask: When was the last time? and What happened that time?      N/a 6. CARDIAC HISTORY: Do you have any history of heart disease? (e.g., heart attack, angina, bypass surgery, angioplasty)      Hx of CHF, heart attack. Endorses taking fluid pill, but still with BLE swelling (L>R). 7. LUNG HISTORY: Do you have any history of lung disease?  (e.g., pulmonary embolus, asthma, emphysema)     OSA 8. CAUSE: What do you think is causing the breathing problem?      unknown 9. OTHER SYMPTOMS: Do you have any other symptoms? (e.g., chest pain, cough, dizziness, fever, runny nose)     Headache, fatigue-increased falling asleep in chair. Endorses dizziness from sitting to standing. 10. O2 SATURATION MONITOR:  Do you use an oxygen saturation monitor (pulse oximeter) at home? If Yes, ask: What is your  reading (oxygen level) today? What is your usual oxygen saturation reading? (e.g., 95%)       N/a 11. PREGNANCY: Is there any chance you are pregnant? When was your last menstrual period?       N/a 12. TRAVEL: Have you traveled out of the country in the last month? (e.g., travel history, exposures)       N/a  Protocols used: Breathing Difficulty-A-AH

## 2023-10-29 ENCOUNTER — Ambulatory Visit: Admitting: "Endocrinology

## 2023-10-29 ENCOUNTER — Ambulatory Visit: Admitting: Pulmonary Disease

## 2023-10-30 NOTE — Telephone Encounter (Signed)
 Patient was scheduled to see me on 10/1 but was a no-show.

## 2023-10-30 NOTE — Telephone Encounter (Signed)
 Atc x1 lmtcb

## 2023-11-06 NOTE — Telephone Encounter (Signed)
 Patient scheduled for 12/11/2023. NFN

## 2023-11-19 ENCOUNTER — Other Ambulatory Visit: Payer: Self-pay | Admitting: "Endocrinology

## 2023-11-19 DIAGNOSIS — E1165 Type 2 diabetes mellitus with hyperglycemia: Secondary | ICD-10-CM

## 2023-11-20 ENCOUNTER — Other Ambulatory Visit (HOSPITAL_COMMUNITY): Payer: Self-pay

## 2023-11-20 NOTE — Telephone Encounter (Signed)
 Requested Prescriptions   Pending Prescriptions Disp Refills   HUMULIN  R U-500 KWIKPEN 500 UNIT/ML KwikPen [Pharmacy Med Name: HumuLIN  R U-500 KwikPen 500 UNIT/ML Subcutaneous Solution Pen-injector] 12 mL 0    Sig: INJECT 140 UNITS INTO THE SKIN BEFORE BREAKFAST AND 140 UNITS BEFORE SUPPER 30 MINUTES BEFORE MEALS

## 2023-12-08 ENCOUNTER — Other Ambulatory Visit: Payer: Self-pay | Admitting: General Practice

## 2023-12-11 ENCOUNTER — Ambulatory Visit: Admitting: Pulmonary Disease

## 2023-12-15 ENCOUNTER — Other Ambulatory Visit: Payer: Self-pay | Admitting: "Endocrinology

## 2023-12-15 DIAGNOSIS — E1165 Type 2 diabetes mellitus with hyperglycemia: Secondary | ICD-10-CM

## 2023-12-16 NOTE — Progress Notes (Signed)
 Patient's office note and sleep study report sent to [facility] to process Bipap order. Documentation sent to [facility] via email (Received). NFN

## 2023-12-22 ENCOUNTER — Ambulatory Visit (INDEPENDENT_AMBULATORY_CARE_PROVIDER_SITE_OTHER): Admitting: "Endocrinology

## 2023-12-22 ENCOUNTER — Other Ambulatory Visit

## 2023-12-22 ENCOUNTER — Encounter: Payer: Self-pay | Admitting: "Endocrinology

## 2023-12-22 ENCOUNTER — Other Ambulatory Visit: Payer: Self-pay | Admitting: "Endocrinology

## 2023-12-22 ENCOUNTER — Other Ambulatory Visit: Payer: Self-pay

## 2023-12-22 VITALS — BP 110/80 | HR 84 | Ht 66.0 in | Wt 388.0 lb

## 2023-12-22 DIAGNOSIS — Z794 Long term (current) use of insulin: Secondary | ICD-10-CM

## 2023-12-22 DIAGNOSIS — Z7984 Long term (current) use of oral hypoglycemic drugs: Secondary | ICD-10-CM

## 2023-12-22 DIAGNOSIS — E039 Hypothyroidism, unspecified: Secondary | ICD-10-CM

## 2023-12-22 DIAGNOSIS — E782 Mixed hyperlipidemia: Secondary | ICD-10-CM

## 2023-12-22 DIAGNOSIS — E1165 Type 2 diabetes mellitus with hyperglycemia: Secondary | ICD-10-CM

## 2023-12-22 LAB — POCT GLYCOSYLATED HEMOGLOBIN (HGB A1C): Hemoglobin A1C: 9.7 % — AB (ref 4.0–5.6)

## 2023-12-22 MED ORDER — FREESTYLE LIBRE 3 PLUS SENSOR MISC: 1.0000 | 3 refills | Status: AC

## 2023-12-22 MED ORDER — FREESTYLE LIBRE 3 PLUS SENSOR MISC
1.0000 | 3 refills | Status: AC
  Filled 2023-12-22: qty 6, 90d supply, fill #0

## 2023-12-22 NOTE — Progress Notes (Addendum)
 Outpatient Endocrinology Note Obadiah Birmingham, MD  12/22/23   [name] [birth year] [id number]  Referring Provider: Donata Snowman, PA* Primary Care Provider: Shepperson, Kirstin, PA-C Reason for consultation: Subjective   Assessment & Plan  Diagnoses and all orders for this visit:  Uncontrolled type 2 diabetes mellitus with hyperglycemia, with long-term current use of insulin  (HCC) -     POCT glycosylated hemoglobin (Hb A1C) -     Microalbumin / creatinine urine ratio -     Continuous Glucose Sensor (FREESTYLE LIBRE 3 PLUS SENSOR) MISC; Inject 1 Device into the skin continuous. 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, neuropathy, MI  Lab Results  Component Value Date   GFR 71.22 11/01/2022   Hba1c goal less than 7, current Hba1c is  Lab Results  Component Value Date   HGBA1C 9.7 (A) 12/22/2023   Will recommend the following: Cannot make any changes in patient's medications given lack of data, patient will need to start with CGM and return for adjustment of her insulin  doses as needed Patient is supposed to be on: Sent ozempic  0.25 mg/week previously but patient did not started U-500 140 units before break fast and 140 units before supper-30 min before meals (doses self adjusted by the patient) Metformin  XR 500 mg 2 pills bid Jardiance  25 mg every day   Stopped Januvia 25 mg every day  No known contraindications/side effects to any of above medications No history of MEN syndrome/medullary thyroid  cancer/pancreatitis or pancreatic cancer in self or family Had a long discussion with the patient about GLP-1, patient was afraid of the side effects,  discussed at length.  Patient is interested in trying it  -Last LD and Tg are as follows: Lab Results  Component Value Date   LDLCALC 36 08/25/2023    Lab Results  Component Value Date   TRIG 168 (H) 08/25/2023   -On rosuvastatin  40 mg QD -Follow low fat diet and exercise   -Blood pressure goal <140/90 - Microalbumin/creatinine goal is < 30 -Last MA/Cr is as follows: No results found for: MICROALBUR, MALB24HUR  -not on ACE/ARB  -diet changes including salt restriction -limit eating outside -counseled BP targets per standards of diabetes care -uncontrolled blood pressure can lead to retinopathy, nephropathy and cardiovascular and atherosclerotic heart disease  Reviewed and counseled on: -A1C target -Blood sugar targets -Complications of uncontrolled diabetes  -Checking blood sugar before meals and bedtime and bring log next visit -All medications with mechanism of action and side effects -Hypoglycemia management: rule of 15's, Glucagon Emergency Kit and medical alert ID -low-carb low-fat plate-method diet -At least 20 minutes of physical activity per day -Annual dilated retinal eye exam and foot exam -compliance and follow up needs -follow up as scheduled or earlier if problem gets worse  Call if blood sugar is less than 70 or consistently above 250    Take a 15 gm snack of carbohydrate at bedtime before you go to sleep if your blood sugar is less than 100.    If you are going to fast after midnight for a test or procedure, ask your physician for instructions on how to reduce/decrease your insulin  dose.  Call if blood sugar is less than 70 or consistently above 250  -Treating a low sugar by rule of 15  (15 gms of sugar every 15 min until sugar is more than 70) If you feel your sugar is low, test your sugar to be sure If your sugar is low (less than 70), then take 15 grams of a fast acting Carbohydrate (3-4 glucose tablets or glucose gel or 4 ounces of juice or  regular soda) Recheck your sugar 15 min after treating low to make sure it is more than 70 If sugar is still less than 70, treat again with 15 grams of carbohydrate          Don't drive the hour of hypoglycemia  If unconscious/unable to eat or drink by mouth, use glucagon injection or nasal spray baqsimi and call 911. Can repeat again in 15 min if still unconscious.  Return in about 2 weeks (around 01/05/2024) for visit, labs today.   I have reviewed current medications, nurse's notes, allergies, vital signs, past medical and surgical history, family medical history, and social history for this encounter. Counseled patient on symptoms, examination findings, lab findings, imaging results, treatment decisions and monitoring and prognosis. The patient understood the recommendations and agrees with the treatment plan. All questions regarding treatment plan were fully answered.  Obadiah Birmingham, MD  12/22/23   History of Present Illness [name] is a 53 y.o. year old female who presents for follow up of Type II diabetes mellitus.  [name] was first diagnosed in age 64, 50.    Home diabetes regimen: Metformin  XR 500 mg 2 pills bid U-500 150 units before break fast and 150 units before supper-30 min before meals  Self stopped: Januvia 25 mg every day Jardiance  25 mg every day   Stopped Humulin  70/30 150 units before dinner and 130 units before dinner  COMPLICATIONS +  MI/Stroke +  retinopathy +  neuropathy -  nephropathy  BLOOD SUGAR DATA Did not bring meter No sugar log  Physical Exam  BP 110/80   Pulse 84   Ht 5' 6 (1.676 m)   Wt (!) 388 lb (176 kg)   SpO2 94%   BMI 62.62 kg/m    Constitutional: well developed, well nourished Head: normocephalic, atraumatic Eyes: sclera anicteric, no redness Neck: supple Lungs: normal respiratory effort Neurology: alert and oriented Skin: dry, no appreciable rashes Musculoskeletal: no appreciable defects Psychiatric:  normal mood and affect Diabetic Foot Exam - Simple   No data filed      Current Medications Patient's Medications  New Prescriptions   No medications on file  Previous Medications   ACETAMINOPHEN  (TYLENOL ) 500 MG TABLET    Take 1,000 mg by mouth every 6 (six) hours as needed for moderate pain or headache.   ASPIRIN  EC 81 MG TABLET    Take 81 mg by mouth daily. Swallow whole.   B COMPLEX-C (SUPER B COMPLEX PO)    Take 1 tablet by mouth daily.   CHOLECALCIFEROL (VITAMIN D3) 50 MCG (2000 UT) CAPSULE    Take 2,000 Units by mouth daily.   CONTINUOUS GLUCOSE SENSOR (FREESTYLE LIBRE 3 SENSOR) MISC    APPLY 1 SENSOR ON UPPER ARM EVERY 14 DAYS FOR CONTINUOUS GLUCOSE MONITORING   DULOXETINE  (CYMBALTA ) 30 MG CAPSULE    Take 30 mg by mouth daily.   EMPAGLIFLOZIN  (JARDIANCE ) 25 MG TABS TABLET    Take 1 tablet (25 mg total) by mouth daily before breakfast.   FUROSEMIDE  (LASIX )  40 MG TABLET    Take 1 tablet by mouth once daily   GABAPENTIN  (NEURONTIN ) 300 MG CAPSULE    Take 2 capsules by mouth 3 (three) times daily.   HUMULIN  R U-500 KWIKPEN 500 UNIT/ML KWIKPEN    INJECT 140 UNITS INTO THE SKIN BEFORE BREAKFAST AND 140 UNITS BEFORE SUPPER 30 MINUTES BEFORE MEALS   INSULIN  SYRINGES, DISPOSABLE, U-100 1 ML MISC    Use to inject insulin  daily   LEVOTHYROXINE  (SYNTHROID , LEVOTHROID) 50 MCG TABLET    Take 50 mcg by mouth daily.   METFORMIN  (GLUCOPHAGE -XR) 500 MG 24 HR TABLET    TAKE 4 TABLETS BY MOUTH WITH BREAKFAST   METOPROLOL  SUCCINATE (TOPROL -XL) 50 MG 24 HR TABLET    Take 1 tablet by mouth once daily   NYSTATIN  CREAM (MYCOSTATIN )    Apply 1 application  topically 2 (two) times daily. Abdominal folds   OMEPRAZOLE (PRILOSEC) 40 MG CAPSULE    Take 40 mg by mouth 2 (two) times daily.   OZEMPIC , 0.25 OR 0.5 MG/DOSE, 2 MG/3ML SOPN    INJECT 0.25 MG SUBCUTANEOUSLY ONCE A WEEK   POTASSIUM CHLORIDE  (KLOR-CON ) 10 MEQ TABLET    Take 2 tablets by mouth once daily   RELION PEN NEEDLES 31G X 6 MM MISC    USE 1   TWICE DAILY   ROSUVASTATIN  (CRESTOR ) 40 MG TABLET    Take 40 mg by mouth daily.   SENNOSIDES 17.2 MG TABS    Take 34.4 mg by mouth 3 (three) times a week. No set days  Modified Medications   Modified Medication Previous Medication   CONTINUOUS GLUCOSE SENSOR (FREESTYLE LIBRE 3 PLUS SENSOR) MISC Continuous Glucose Sensor (FREESTYLE LIBRE 3 PLUS SENSOR) MISC      Inject 1 Device into the skin continuous. Change every 15 days    Inject 1 Device into the skin continuous. Change every 15 days   CONTINUOUS GLUCOSE SENSOR (FREESTYLE LIBRE 3 PLUS SENSOR) MISC Continuous Glucose Sensor (FREESTYLE LIBRE 3 PLUS SENSOR) MISC      Inject 1 Device into the skin continuous. Change every 15 days    Inject 1 Device into the skin continuous. Change every 15 days  Discontinued Medications   No medications on file    Allergies Allergies  Allergen Reactions   Irbesartan  Other (See Comments)    Dizziness, muscle pain   Clindamycin/Lincomycin Other (See Comments) and Nausea And Vomiting    Stomach pain    Latex Rash   Ultram [Tramadol] Rash    Past Medical History Past Medical History:  Diagnosis Date   CAD (coronary artery disease)    a. s/p NSTEMI, LHC 03/2020 dRCA 25%, m-LAD-1 95%-0% (DES), mLAD-2 50%, LVEDP moderately elevated, 27 mmHg.   Chronic diastolic heart failure (HCC)    a. LHC/echo 3/22 c/ elevated LVEDP, EF 50-55%, hypokinesis; b. Echo 10/22 EF 60-65%, mild LVH, borderline dilatation of ascending aorta, 39mm.   DDD (degenerative disc disease), lumbar    Diabetes mellitus without complication (HCC)    GERD (gastroesophageal reflux disease)    Hyperlipidemia    Hypertension    Hypothyroidism    Lumbar herniated disc    Major depressive disorder    Morbid obesity (HCC)     Past Surgical History Past Surgical History:  Procedure Laterality Date   CORONARY STENT INTERVENTION N/A 03/28/2020   Procedure: CORONARY STENT INTERVENTION;  Surgeon: Dann Candyce RAMAN, MD;  Location: [facility]  INVASIVE CV LAB;  Service: Cardiovascular;  Laterality: N/A;  DILATION AND CURETTAGE OF UTERUS     INCISION AND DRAINAGE PERIRECTAL ABSCESS  06/21/2014   INCISION AND DRAINAGE PERIRECTAL ABSCESS Bilateral 06/21/2014   Procedure: IRRIGATION AND DEBRIDEMENT PERIRECTAL ABSCESS;  Surgeon: Vicenta Poli, MD;  Location: [facility] OR;  Service: General;  Laterality: Bilateral;   LEFT HEART CATH AND CORONARY ANGIOGRAPHY N/A 03/28/2020   Procedure: LEFT HEART CATH AND CORONARY ANGIOGRAPHY;  Surgeon: Dann Candyce RAMAN, MD;  Location: [facility] INVASIVE CV LAB;  Service: Cardiovascular;  Laterality: N/A;    Family History family history includes Diabetes in her father and mother.  Social History Social History   Socioeconomic History   Marital status: Single    Spouse name: Not on file   Number of children: Not on file   Years of education: Not on file   Highest education level: Not on file  Occupational History   Not on file  Tobacco Use   Smoking status: Never    Passive exposure: Past   Smokeless tobacco: Never  Vaping Use   Vaping status: Never Used  Substance and Sexual Activity   Alcohol use: No   Drug use: No   Sexual activity: Not on file  Other Topics Concern   Not on file  Social History Narrative   Not on file   Social Drivers of Health   Financial Resource Strain: Low Risk  (04/22/2023)   Received from [facility]   Overall Financial Resource Strain (CARDIA)    Difficulty of Paying Living Expenses: Not hard at all  Food Insecurity: No Food Insecurity (04/22/2023)   Received from [facility]   Hunger Vital Sign    Within the past 12 months, you worried that your food would run out before you got the money to buy more.: Never true    Within the past 12 months, the food you bought just didn't last and you didn't have money to get more.: Never true  Transportation Needs: No Transportation Needs (04/22/2023)   Received from [facility] - Transportation    Lack of  Transportation (Non-Medical): No    Lack of Transportation (Medical): No  Physical Activity: Inactive (03/29/2020)   Exercise Vital Sign    Days of Exercise per Week: 0 days    Minutes of Exercise per Session: 0 min  Stress: Not on file  Social Connections: Not on file  Intimate Partner Violence: Not on file    Lab Results  Component Value Date   HGBA1C 9.7 (A) 12/22/2023   HGBA1C 9.5 (A) 02/13/2022   HGBA1C 7.2 (A) 05/31/2021   Lab Results  Component Value Date   CHOL 101 08/25/2023   Lab Results  Component Value Date   HDL 37 (L) 08/25/2023   Lab Results  Component Value Date   LDLCALC 36 08/25/2023   Lab Results  Component Value Date   TRIG 168 (H) 08/25/2023   Lab Results  Component Value Date   CHOLHDL 2.7 08/25/2023   Lab Results  Component Value Date   CREATININE 1.32 (H) 08/25/2023   Lab Results  Component Value Date   GFR 71.22 11/01/2022   No results found for: MACKEY CURRENT     Component Value Date/Time   NA 145 (H) 08/25/2023 1034   K 4.0 08/25/2023 1034   CL 102 08/25/2023 1034   CO2 25 08/25/2023 1034   GLUCOSE 101 (H) 08/25/2023 1034   GLUCOSE 100 (H) 11/01/2022 1053   BUN 26 (H) 08/25/2023 1034   CREATININE 1.32 (H)  08/25/2023 1034   CALCIUM  9.2 08/25/2023 1034   PROT 6.4 08/25/2023 1034   ALBUMIN 4.0 08/25/2023 1034   AST 14 08/25/2023 1034   ALT 21 08/25/2023 1034   ALKPHOS 98 08/25/2023 1034   BILITOT 0.9 08/25/2023 1034   GFRNONAA >60 07/21/2021 2026   GFRAA >60 06/23/2014 0357      Latest Ref Rng & Units 08/25/2023   10:34 AM 11/01/2022   10:53 AM 02/13/2022   11:16 AM  BMP  Glucose 70 - 99 mg/dL 898  899  82   BUN 6 - 24 mg/dL 26  27  28    Creatinine 0.57 - 1.00 mg/dL 8.67  9.06  8.95   BUN/Creat Ratio 9 - 23 20     Sodium 134 - 144 mmol/L 145  142  141   Potassium 3.5 - 5.2 mmol/L 4.0  4.0  3.5   Chloride 96 - 106 mmol/L 102  101  98   CO2 20 - 29 mmol/L 25  33  36   Calcium  8.7 - 10.2 mg/dL 9.2  9.4  9.0         Component Value Date/Time   WBC 9.2 08/25/2023 1034   WBC 9.0 07/21/2021 2026   RBC 4.44 08/25/2023 1034   RBC 3.86 (L) 07/21/2021 2026   HGB 13.1 08/25/2023 1034   HCT 40.3 08/25/2023 1034   PLT 196 08/25/2023 1034   MCV 91 08/25/2023 1034   MCH 29.5 08/25/2023 1034   MCH 26.4 07/21/2021 2026   MCHC 32.5 08/25/2023 1034   MCHC 30.7 07/21/2021 2026   RDW 14.2 08/25/2023 1034   LYMPHSABS 2.3 04/09/2021 0233   MONOABS 0.7 04/09/2021 0233   EOSABS 0.3 04/09/2021 0233   BASOSABS 0.0 04/09/2021 0233     Parts of this note may have been dictated using voice recognition software. There may be variances in spelling and vocabulary which are unintentional. Not all errors are proofread. Please notify the dino if any discrepancies are noted or if the meaning of any statement is not clear.

## 2023-12-22 NOTE — Telephone Encounter (Signed)
 Requested Prescriptions   Pending Prescriptions Disp Refills   Continuous Glucose Sensor (FREESTYLE LIBRE 3 PLUS SENSOR) MISC 6 each 3    Sig: Inject 1 Device into the skin continuous. Change every 15 days

## 2023-12-22 NOTE — Patient Instructions (Signed)

## 2023-12-23 LAB — MICROALBUMIN / CREATININE URINE RATIO
Creatinine, Urine: 141 mg/dL (ref 20–275)
Microalb Creat Ratio: 67 mg/g{creat} — ABNORMAL HIGH (ref <30)
Microalb, Ur: 9.4 mg/dL

## 2023-12-23 LAB — TSH+FREE T4: TSH W/REFLEX TO FT4: 2.47 m[IU]/L

## 2024-01-02 ENCOUNTER — Other Ambulatory Visit: Payer: Self-pay | Admitting: "Endocrinology

## 2024-01-02 DIAGNOSIS — E1165 Type 2 diabetes mellitus with hyperglycemia: Secondary | ICD-10-CM

## 2024-01-02 NOTE — Telephone Encounter (Signed)
 Requested Prescriptions   Pending Prescriptions Disp Refills   HUMULIN  R U-500 KWIKPEN 500 UNIT/ML KwikPen [Pharmacy Med Name: HumuLIN  R U-500 KwikPen 500 UNIT/ML Subcutaneous Solution Pen-injector] 12 mL 0    Sig: INJECT 140 UNITS INTO THE SKIN BEFORE BREAKFAST AND 140 UNITS BEFORE SUPPER 30 MINUTES BEFORE MEALS

## 2024-01-07 ENCOUNTER — Ambulatory Visit: Admitting: "Endocrinology

## 2024-01-12 ENCOUNTER — Other Ambulatory Visit: Payer: Self-pay | Admitting: "Endocrinology

## 2024-01-12 DIAGNOSIS — E1165 Type 2 diabetes mellitus with hyperglycemia: Secondary | ICD-10-CM

## 2024-01-12 NOTE — Telephone Encounter (Signed)
 Requested Prescriptions   Pending Prescriptions Disp Refills   metFORMIN  (GLUCOPHAGE -XR) 500 MG 24 hr tablet [Pharmacy Med Name: metFORMIN  HCl ER 500 MG Oral Tablet Extended Release 24 Hour] 360 tablet 0    Sig: TAKE 4 TABLETS BY MOUTH WITH BREAKFAST

## 2024-01-14 ENCOUNTER — Telehealth: Payer: Self-pay

## 2024-01-14 NOTE — Telephone Encounter (Signed)
 CRM received from [employer] regarding pt's oxygen. This is only requiring a signature from the provider. I will fax once signed.

## 2024-01-19 NOTE — Telephone Encounter (Signed)
 This has been signed and faxed. NFN

## 2024-01-26 ENCOUNTER — Telehealth: Admitting: "Endocrinology

## 2024-01-26 ENCOUNTER — Encounter: Payer: Self-pay | Admitting: "Endocrinology

## 2024-01-26 VITALS — Ht 66.0 in | Wt 388.0 lb

## 2024-01-26 DIAGNOSIS — Z7984 Long term (current) use of oral hypoglycemic drugs: Secondary | ICD-10-CM | POA: Diagnosis not present

## 2024-01-26 DIAGNOSIS — Z794 Long term (current) use of insulin: Secondary | ICD-10-CM | POA: Diagnosis not present

## 2024-01-26 DIAGNOSIS — E1165 Type 2 diabetes mellitus with hyperglycemia: Secondary | ICD-10-CM

## 2024-01-26 DIAGNOSIS — E782 Mixed hyperlipidemia: Secondary | ICD-10-CM | POA: Diagnosis not present

## 2024-01-26 MED ORDER — OZEMPIC (0.25 OR 0.5 MG/DOSE) 2 MG/3ML ~~LOC~~ SOPN: 0.5000 mg | PEN_INJECTOR | SUBCUTANEOUS | 0 refills | Status: AC

## 2024-01-26 MED ORDER — EMPAGLIFLOZIN 10 MG PO TABS: 10.0000 mg | ORAL_TABLET | Freq: Every day | ORAL | 0 refills | Status: AC

## 2024-01-26 NOTE — Progress Notes (Signed)
 The patient reports they are currently: [name]. I spent 17 minutes on the video with the patient on the date of service. I spent an additional 5 minutes on pre- and post-visit activities on the date of service.   The patient was physically located in [place]  or a state in which I am permitted to provide care. The patient and/or parent/guardian understood that s/he may incur co-pays and cost sharing, and agreed to the telemedicine visit. The visit was reasonable and appropriate under the circumstances given the patient's presentation at the time.  The patient and/or parent/guardian understands the potential risks and limitations of this mode of treatment (including, but not limited to, the absence of in-person examination) and has agreed to be treated using telemedicine. The patient's/patient's family's questions regarding telemedicine have been answered.   The patient and/or parent/guardian will contact their provider's office for worsening conditions, and seek emergency medical treatment and/or call 911 if the patient deems either necessary.    Outpatient Endocrinology Note Obadiah Birmingham, MD  01/26/2024   Camelia Spillers 1970-03-23 [id number]  Referring Provider: Donata Snowman, PA-C Primary Care Provider: Shepperson, Kirstin, PA-C Reason for consultation: Subjective   Assessment & Plan  Diagnoses and all orders for this visit:  Uncontrolled type 2 diabetes mellitus with hyperglycemia, with long-term current use of insulin  (HCC)  Long term (current) use of oral hypoglycemic drugs  Long-term insulin  use (HCC)  Insulin  dose changed (HCC)  Mixed hyperlipidemia  Other orders -     Semaglutide ,0.25 or 0.5MG /DOS, (OZEMPIC , 0.25 OR 0.5 MG/DOSE,) 2 MG/3ML SOPN; Inject 0.5 mg into the skin once a week. -     empagliflozin  (JARDIANCE ) 10 MG TABS tablet; Take 1 tablet (10 mg total) by mouth daily.    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 Stop dose escalation if blood sugar drops <80 anytime of the day. Metformin  XR 500 mg 2 pills bid Jardiance  10 mg every day  DM educator will follow up with the patient in 1-2 weeks  Patient understands that CGM readings could be very misleading at times, and will double check her blood sugars with blood glucose meter if readings are too high/too low or unexpected. Patient will also avoid sleeping on the side of CGM to avoid false low readings. Patient will use OTC skin tac under CGM and tegaderm on top of CGM to improve CGM sticking as needed.  Stopped Januvia 25 mg every day  No known contraindications/side effects to any of above medications No history of MEN syndrome/medullary thyroid  cancer/pancreatitis or pancreatic cancer in self or family Had a long discussion with the patient about GLP-1, patient was afraid of the side effects, discussed at length.  Patient is interested in trying it  -Last LD and Tg are as follows: Lab Results  Component Value Date   LDLCALC 36 08/25/2023    Lab Results  Component Value Date   TRIG 168 (H) 08/25/2023   -On rosuvastatin  40 mg QD -Follow low fat diet and exercise   -Blood pressure goal <140/90 - Microalbumin/creatinine goal is < 30 -Last MA/Cr is as follows: Lab Results  Component Value Date   MICROALBUR 9.4 12/22/2023    -not on ACE/ARB  -diet changes including salt  restriction -limit eating outside -counseled BP targets per standards of diabetes care -uncontrolled blood pressure can lead to retinopathy, nephropathy and cardiovascular and atherosclerotic heart disease  Reviewed and counseled on: -A1C target -Blood sugar targets -Complications of uncontrolled diabetes  -Checking blood sugar before meals and bedtime and bring log next visit -All medications with mechanism of action and side effects -Hypoglycemia management: rule of 15's, Glucagon Emergency Kit and medical alert ID -low-carb low-fat plate-method diet -At least 20 minutes of physical activity per day -Annual dilated retinal eye exam and foot exam -compliance and follow up needs -follow up as scheduled or earlier if problem gets worse  Call if blood sugar is less than 70 or consistently above 250    Take a 15 gm snack of carbohydrate at bedtime before you go to sleep if your blood sugar is less than 100.    If you are going to fast after midnight for a test or procedure, ask your physician for instructions on how to reduce/decrease your insulin  dose.    Call if blood sugar is less than 70 or consistently above 250  -Treating a low sugar by rule of 15  (15 gms of sugar every 15 min until sugar is more than 70) If you feel your sugar is low, test your sugar to be sure If your sugar is low (less than 70), then take 15 grams of a fast acting Carbohydrate (3-4 glucose tablets or glucose gel or 4 ounces of juice or regular soda) Recheck your sugar 15 min after treating low to make sure it is more than 70 If sugar is still less than 70, treat again with 15 grams of carbohydrate          Don't drive the hour of hypoglycemia  If unconscious/unable to eat or drink by mouth, use glucagon injection or nasal spray baqsimi and call 911. Can repeat again in 15 min if still unconscious.  Return in about 25 days (around 02/20/2024).   I have reviewed current medications, nurse's notes, allergies,  vital signs, past medical and surgical history, family medical history, and social history for this encounter. Counseled patient on symptoms, examination findings, lab findings, imaging results, treatment decisions and monitoring and prognosis. The patient understood the recommendations and agrees with the treatment plan. All questions regarding treatment plan were fully answered.  Obadiah Birmingham, MD  01/26/2024   History of Present Illness Lasandra Pecina is a 53 y.o. year old female who presents for follow up of Type II diabetes mellitus.  Remy Depaola was first diagnosed in age 47, 89.    Home diabetes regimen: Metformin  XR 500 mg 2 pills bid U-500 140 units before break fast and 140 units before supper-30 min before meals  Self stopped: Januvia 25 mg every day Jardiance  25 mg every day   Stopped Humulin  70/30 150 units before dinner and 130 units before dinner  COMPLICATIONS +  MI/Stroke +  retinopathy +  neuropathy -  nephropathy  BLOOD SUGAR DATA  CGM interpretation: At today's visit, we reviewed her CGM downloads. The full report is scanned in the media. Reviewing the CGM trends, BG  are high - undetectably high most of the day. CGM is connected only 305 of the time.   Physical Exam  Ht 5' 6 (1.676 m)   Wt (!) 388 lb (176 kg)   BMI 62.62 kg/m    Constitutional: well developed, well nourished Head: normocephalic, atraumatic Eyes: sclera anicteric, no redness Neck: supple Lungs: normal respiratory effort Neurology: alert and oriented Skin: dry, no appreciable rashes Musculoskeletal: no appreciable defects Psychiatric: normal mood and affect Diabetic Foot Exam - Simple   No data filed      Current Medications Patient's Medications  New Prescriptions   EMPAGLIFLOZIN  (JARDIANCE ) 10 MG TABS TABLET    Take 1 tablet (10 mg total) by mouth daily.   SEMAGLUTIDE ,0.25 OR 0.5MG /DOS, (OZEMPIC , 0.25 OR 0.5 MG/DOSE,) 2 MG/3ML SOPN    Inject 0.5 mg into the skin once  a week.  Previous Medications   ACETAMINOPHEN  (TYLENOL ) 500 MG TABLET    Take 1,000 mg by mouth every 6 (six) hours as needed for moderate pain or headache.   ASPIRIN  EC 81 MG TABLET    Take 81 mg by mouth daily. Swallow whole.   B COMPLEX-C (SUPER B COMPLEX PO)    Take 1 tablet by mouth daily.   CHOLECALCIFEROL (VITAMIN D3) 50 MCG (2000 UT) CAPSULE    Take 2,000 Units by mouth daily.   CONTINUOUS GLUCOSE SENSOR (FREESTYLE LIBRE 3 PLUS SENSOR) MISC    Inject 1 Device into the skin continuous. Change every 15 days   CONTINUOUS GLUCOSE SENSOR (FREESTYLE LIBRE 3 PLUS SENSOR) MISC    Inject 1 Device into the skin continuous. Change every 15 days   CONTINUOUS GLUCOSE SENSOR (FREESTYLE LIBRE 3 SENSOR) MISC    APPLY 1 SENSOR ON UPPER ARM EVERY 14 DAYS FOR CONTINUOUS GLUCOSE MONITORING   DULOXETINE  (CYMBALTA ) 30 MG CAPSULE    Take 30 mg by mouth daily.   FUROSEMIDE  (LASIX ) 40 MG TABLET    Take 1 tablet by mouth once daily   GABAPENTIN  (NEURONTIN ) 300 MG CAPSULE    Take 2 capsules by mouth 3 (three) times daily.   HUMULIN  R U-500 KWIKPEN 500 UNIT/ML KWIKPEN    INJECT 140 UNITS INTO THE SKIN BEFORE BREAKFAST AND 140 UNITS BEFORE SUPPER 30 MINUTES BEFORE MEALS   INSULIN  SYRINGES, DISPOSABLE, U-100 1 ML MISC    Use to inject insulin  daily   LEVOTHYROXINE  (SYNTHROID , LEVOTHROID) 50 MCG TABLET    Take 50 mcg by mouth daily.   METFORMIN  (GLUCOPHAGE -XR) 500 MG 24 HR TABLET    TAKE 4 TABLETS BY MOUTH WITH BREAKFAST   METOPROLOL  SUCCINATE (TOPROL -XL) 50 MG 24 HR TABLET    Take 1 tablet by mouth once daily   NYSTATIN  CREAM (MYCOSTATIN )    Apply 1 application  topically 2 (two) times daily. Abdominal folds   OMEPRAZOLE (PRILOSEC) 40 MG CAPSULE    Take 40 mg by mouth 2 (two) times daily.   OZEMPIC , 0.25 OR 0.5 MG/DOSE, 2 MG/3ML SOPN    INJECT 0.25 MG SUBCUTANEOUSLY ONCE A WEEK   POTASSIUM CHLORIDE  (KLOR-CON ) 10 MEQ TABLET    Take 2 tablets by mouth once daily   RELION PEN NEEDLES 31G X 6 MM MISC    USE 1  TWICE  DAILY   ROSUVASTATIN  (CRESTOR ) 40 MG TABLET    Take 40 mg by mouth daily.   SENNOSIDES 17.2 MG TABS    Take 34.4 mg by mouth 3 (three) times a week. No set days  Modified Medications   No  medications on file  Discontinued Medications   EMPAGLIFLOZIN  (JARDIANCE ) 25 MG TABS TABLET    Take 1 tablet (25 mg total) by mouth daily before breakfast.    Allergies Allergies  Allergen Reactions   Irbesartan  Other (See Comments)    Dizziness, muscle pain   Clindamycin/Lincomycin Other (See Comments) and Nausea And Vomiting    Stomach pain    Latex Rash   Ultram [Tramadol] Rash    Past Medical History Past Medical History:  Diagnosis Date   CAD (coronary artery disease)    a. s/p NSTEMI, LHC 03/2020 dRCA 25%, m-LAD-1 95%-0% (DES), mLAD-2 50%, LVEDP moderately elevated, 27 mmHg.   Chronic diastolic heart failure (HCC)    a. LHC/echo 3/22 c/ elevated LVEDP, EF 50-55%, hypokinesis; b. Echo 10/22 EF 60-65%, mild LVH, borderline dilatation of ascending aorta, 39mm.   DDD (degenerative disc disease), lumbar    Diabetes mellitus without complication (HCC)    GERD (gastroesophageal reflux disease)    Hyperlipidemia    Hypertension    Hypothyroidism    Lumbar herniated disc    Major depressive disorder    Morbid obesity (HCC)     Past Surgical History Past Surgical History:  Procedure Laterality Date   CORONARY STENT INTERVENTION N/A 03/28/2020   Procedure: CORONARY STENT INTERVENTION;  Surgeon: Dann Candyce RAMAN, MD;  Location: [facility] INVASIVE CV LAB;  Service: Cardiovascular;  Laterality: N/A;   DILATION AND CURETTAGE OF UTERUS     INCISION AND DRAINAGE PERIRECTAL ABSCESS  06/21/2014   INCISION AND DRAINAGE PERIRECTAL ABSCESS Bilateral 06/21/2014   Procedure: IRRIGATION AND DEBRIDEMENT PERIRECTAL ABSCESS;  Surgeon: Vicenta Poli, MD;  Location: [facility] OR;  Service: General;  Laterality: Bilateral;   LEFT HEART CATH AND CORONARY ANGIOGRAPHY N/A 03/28/2020   Procedure: LEFT HEART CATH AND CORONARY  ANGIOGRAPHY;  Surgeon: Dann Candyce RAMAN, MD;  Location: [facility] INVASIVE CV LAB;  Service: Cardiovascular;  Laterality: N/A;    Family History family history includes Diabetes in her father and mother.  Social History Social History   Socioeconomic History   Marital status: Single    Spouse name: Not on file   Number of children: Not on file   Years of education: Not on file   Highest education level: Not on file  Occupational History   Not on file  Tobacco Use   Smoking status: Never    Passive exposure: Past   Smokeless tobacco: Never  Vaping Use   Vaping status: Never Used  Substance and Sexual Activity   Alcohol use: No   Drug use: No   Sexual activity: Not on file  Other Topics Concern   Not on file  Social History Narrative   Not on file   Social Drivers of Health   Tobacco Use: Low Risk (01/26/2024)   Patient History    Smoking Tobacco Use: Never    Smokeless Tobacco Use: Never    Passive Exposure: Past  Financial Resource Strain: Low Risk (04/22/2023)   Received from [facility]   Overall Financial Resource Strain (CARDIA)    Difficulty of Paying Living Expenses: Not hard at all  Food Insecurity: No Food Insecurity (04/22/2023)   Received from [facility]   Epic    Within the past 12 months, you worried that your food would run out before you got the money to buy more.: Never true    Within the past 12 months, the food you bought just didn't last and you didn't have money to get more.: Never true  Transportation Needs: No Transportation Needs (04/22/2023)   Received from [facility]   PRAPARE - Transportation    Lack of Transportation (Non-Medical): No    Lack of Transportation (Medical): No  Physical Activity: Not on file  Stress: Not on file  Social Connections: Not on file  Intimate Partner Violence: Not on file  Depression (EYV7-0): Not on file  Alcohol Screen: Not on file  Housing: Low Risk (04/22/2023)   Received from [facility]   Epic     At any time in the past 12 months, were you homeless or living in a shelter (including now)?: No    In the past 12 months, how many times have you moved where you were living?: 0    In the last 12 months, was there a time when you were not able to pay the mortgage or rent on time?: No  Utilities: Not At Risk (04/22/2023)   Received from [facility] Utilities    Threatened with loss of utilities: No  Health Literacy: Not on file    Lab Results  Component Value Date   HGBA1C 9.7 (A) 12/22/2023   HGBA1C 9.5 (A) 02/13/2022   HGBA1C 7.2 (A) 05/31/2021   Lab Results  Component Value Date   CHOL 101 08/25/2023   Lab Results  Component Value Date   HDL 37 (L) 08/25/2023   Lab Results  Component Value Date   LDLCALC 36 08/25/2023   Lab Results  Component Value Date   TRIG 168 (H) 08/25/2023   Lab Results  Component Value Date   CHOLHDL 2.7 08/25/2023   Lab Results  Component Value Date   CREATININE 1.32 (H) 08/25/2023   Lab Results  Component Value Date   GFR 71.22 11/01/2022   Lab Results  Component Value Date   MICROALBUR 9.4 12/22/2023       Component Value Date/Time   NA 145 (H) 08/25/2023 1034   K 4.0 08/25/2023 1034   CL 102 08/25/2023 1034   CO2 25 08/25/2023 1034   GLUCOSE 101 (H) 08/25/2023 1034   GLUCOSE 100 (H) 11/01/2022 1053   BUN 26 (H) 08/25/2023 1034   CREATININE 1.32 (H) 08/25/2023 1034   CALCIUM  9.2 08/25/2023 1034   PROT 6.4 08/25/2023 1034   ALBUMIN 4.0 08/25/2023 1034   AST 14 08/25/2023 1034   ALT 21 08/25/2023 1034   ALKPHOS 98 08/25/2023 1034   BILITOT 0.9 08/25/2023 1034   GFRNONAA >60 07/21/2021 2026   GFRAA >60 06/23/2014 0357      Latest Ref Rng & Units 08/25/2023   10:34 AM 11/01/2022   10:53 AM 02/13/2022   11:16 AM  BMP  Glucose 70 - 99 mg/dL 898  899  82   BUN 6 - 24 mg/dL 26  27  28    Creatinine 0.57 - 1.00 mg/dL 8.67  9.06  8.95   BUN/Creat Ratio 9 - 23 20     Sodium 134 - 144 mmol/L 145  142  141    Potassium 3.5 - 5.2 mmol/L 4.0  4.0  3.5   Chloride 96 - 106 mmol/L 102  101  98   CO2 20 - 29 mmol/L 25  33  36   Calcium  8.7 - 10.2 mg/dL 9.2  9.4  9.0        Component Value Date/Time   WBC 9.2 08/25/2023 1034   WBC 9.0 07/21/2021 2026   RBC 4.44 08/25/2023 1034   RBC 3.86 (L) 07/21/2021 2026  HGB 13.1 08/25/2023 1034   HCT 40.3 08/25/2023 1034   PLT 196 08/25/2023 1034   MCV 91 08/25/2023 1034   MCH 29.5 08/25/2023 1034   MCH 26.4 07/21/2021 2026   MCHC 32.5 08/25/2023 1034   MCHC 30.7 07/21/2021 2026   RDW 14.2 08/25/2023 1034   LYMPHSABS 2.3 04/09/2021 0233   MONOABS 0.7 04/09/2021 0233   EOSABS 0.3 04/09/2021 0233   BASOSABS 0.0 04/09/2021 0233     Parts of this note may have been dictated using voice recognition software. There may be variances in spelling and vocabulary which are unintentional. Not all errors are proofread. Please notify the dino if any discrepancies are noted or if the meaning of any statement is not clear.

## 2024-01-26 NOTE — Patient Instructions (Addendum)
 Will recommend the following: Ozempic  0.25 mg/week -> increase to 0.5 mg weekly after 4 weeks if well tolerated  U-500 insulin : 145 units before break fast and 145 units before supper-30 min before meals: increase the dose by 5 units every 2 weeks if 2 hours post meal sugar is elevated above 150 for two days. Stop dose escalation if blood sugar drops <80 anytime of the day. Metformin  XR 500 mg 2 pills bid Jardiance  10 mg every day   CGM readings could be very misleading at times. Please double check her blood sugars with blood glucose meter if readings are too high/too low or unexpected. Patient will also avoid sleeping on the side of CGM to avoid false low readings. Patient will use OTC skin tac under CGM and tegaderm on top of CGM to improve CGM sticking as needed.  ___________   Goals of DM therapy:  Morning Fasting blood sugar: 80-140  Blood sugar before meals: 80-140 Bed time blood sugar: 100-150  A1C <7%, limited only by hypoglycemia  1.Diabetes medications and their side effects discussed, including hypoglycemia    2. Check blood glucose:  a) Always check blood sugars before driving. Please see below (under hypoglycemia) on how to manage b) Check a minimum of 3 times/day or more as needed when having symptoms of hypoglycemia.   c) Try to check blood glucose before sleeping/in the middle of the night to ensure that it is remaining stable and not dropping less than 100 d) Check blood glucose more often if sick  3. Diet: a) 3 meals per day schedule b: Restrict carbs to 60-70 grams (4 servings) per meal c) Colorful vegetables - 3 servings a day, and low sugar fruit 2 servings/day Plate control method: 1/4 plate protein, 1/4 starch, 1/2 green, yellow, or red vegetables d) Avoid carbohydrate snacks unless hypoglycemic episode, or increased physical activity  4. Regular exercise as tolerated, preferably 3 or more hours a week  5. Hypoglycemia: a)  Do not drive or operate machinery  without first testing blood glucose to assure it is over 90 mg%, or if dizzy, lightheaded, not feeling normal, etc, or  if foot or leg is numb or weak. b)  If blood glucose less than 70, take four 5gm Glucose tabs or 15-30 gm Glucose gel.  Repeat every 15 min as needed until blood sugar is >100 mg/dl. If hypoglycemia persists then call 911.   6. Sick day management: a) Check blood glucose more often b) Continue usual therapy if blood sugars are elevated.   7. Contact the doctor immediately if blood glucose is frequently <60 mg/dl, or an episode of severe hypoglycemia occurs (where someone had to give you glucose/  glucagon or if you passed out from a low blood glucose), or if blood glucose is persistently >350 mg/dl, for further management  8. A change in level of physical activity or exercise and a change in diet may also affect your blood sugar. Check blood sugars more often and call if needed.  Instructions: 1. Bring glucose meter, blood glucose records on every visit for review 2. Continue to follow up with primary care physician and other providers for medical care 3. Yearly eye  and foot exam 4. Please get blood work done prior to the next appointment

## 2024-02-04 ENCOUNTER — Ambulatory Visit: Payer: Self-pay | Admitting: Pulmonary Disease

## 2024-02-04 ENCOUNTER — Ambulatory Visit: Admitting: Pulmonary Disease

## 2024-02-04 ENCOUNTER — Encounter: Payer: Self-pay | Admitting: General Practice

## 2024-02-04 NOTE — Telephone Encounter (Signed)
 I called and spoke with the patient, she has not received her CPAP machine that was ordered in September.  I advised her that I would send a message to our patient care coordinators and have them find out what the hold up is and once we hear back we will call her back with an update.  She verbalized understanding.  Looks like the order was placed 10/27/23, order was sent to [facility] and they said they received it on 10/28/2023  PCCs, Can you all find out why this patient has not received her CPAP that was ordered in September of 2025 please?   Thank you.

## 2024-02-04 NOTE — Telephone Encounter (Signed)
 [name] Pulmonary Triage - Initial Assessment Questions Chief Complaint (e.g., cough, sob, wheezing, fever, chills, sweat or additional symptoms) *Go to specific symptom protocol after initial questions. SOB  How long have symptoms been present? Few weeks  Have you tested for COVID or Flu? Note: If not, ask patient if a home test can be taken. If so, instruct patient to call back for positive results. No  MEDICINES:   Have you used any OTC meds to help with symptoms? No If yes, ask What medications?   Have you used your inhalers/maintenance medication? No If yes, What medications?   If inhaler, ask How many puffs and how often? Note: Review instructions on medication in the chart.   OXYGEN: Do you wear supplemental oxygen? Yes If yes, How many liters are you supposed to use? 4L  Do you monitor your oxygen levels? No If yes, What is your reading (oxygen level) today?   What is your usual oxygen saturation reading?  (Note: Pulmonary O2 sats should be 90% or greater)   Reason for Disposition  [1] MILD difficulty breathing (e.g., minimal/no SOB at rest, SOB with walking, pulse < 100) AND [2] NEW-onset or WORSE than normal  Answer Assessment - Initial Assessment Questions Pt unable to make appt today for SOB. Reschedule to next week. Will call 911 for severe SOB/ CP    E2C[facility] Pulmonary Triage - Initial Assessment Questions Chief Complaint (e.g., cough, sob, wheezing, fever, chills, sweat or additional symptoms) *Go to specific symptom protocol after initial questions. Increased SOB with activity  How long have symptoms been present? Few weeks  Have you tested for COVID or Flu? Note: If not, ask patient if a home test can be taken. If so, instruct patient to call back for positive results. No  MEDICINES:   Have you used any OTC meds to help with symptoms? No If yes, ask What medications?   Have you used your inhalers/maintenance  medication? No If yes, What medications?   If inhaler, ask How many puffs and how often? Note: Review instructions on medication in the chart.   OXYGEN: Do you wear supplemental oxygen? Yes If yes, How many liters are you supposed to use? 4  Do you monitor your oxygen levels? No If yes, What is your reading (oxygen level) today?   What is your usual oxygen saturation reading?  (Note: Pulmonary O2 sats should be 90% or greater)     1. RESPIRATORY STATUS: Describe your breathing? (e.g., wheezing, shortness of breath, unable to speak, severe coughing)      Sob with activity 2. ONSET: When did this breathing problem begin?      Few weeks ago 3. PATTERN Does the difficult breathing come and go, or has it been constant since it started?      intermittent 4. SEVERITY: How bad is your breathing? (e.g., mild, moderate, severe)      Speaking in full sentences 5. RECURRENT SYMPTOM: Have you had difficulty breathing before? If Yes, ask: When was the last time? and What happened that time?      ongoing 6. CARDIAC HISTORY: Do you have any history of heart disease? (e.g., heart attack, angina, bypass surgery, angioplasty)      CHF 7. LUNG HISTORY: Do you have any history of lung disease?  (e.g., pulmonary embolus, asthma, emphysema)     Yes 8. CAUSE: What do you think is causing the breathing problem?      Wt gain 9. OTHER SYMPTOMS: Do you have any other  symptoms? (e.g., chest pain, cough, dizziness, fever, runny nose)     denies 10. O2 SATURATION MONITOR:  Do you use an oxygen saturation monitor (pulse oximeter) at home? If Yes, ask: What is your reading (oxygen level) today? What is your usual oxygen saturation reading? (e.g., 95%)  Protocols used: Breathing Difficulty-A-AH   Copied from CRM #[id number]. Topic: Clinical - Red Word Triage >> Feb 04, 2024 11:23 AM [name] wrote: Red Word that prompted transfer to Nurse Triage: Patient has acute  appt today for worsening SOB but wants to switch to virtual due to her legs being swollen. Patient said she is unable to get in the shower or car. She is also unable to take her lasix . Warm transfer to NT

## 2024-02-05 NOTE — Telephone Encounter (Signed)
 ATC x1.  Left detailed VM per DPR.

## 2024-02-06 NOTE — Telephone Encounter (Signed)
 ATC x2.  Left detailed message and asked that she either respond to our VM or the mychart message that was sent so we know she received the message.

## 2024-02-09 NOTE — Telephone Encounter (Signed)
 Message relayed, pt was also given dme number to call for any question. NFN She will keep apt scheduled tomorrow.

## 2024-02-10 ENCOUNTER — Encounter: Payer: Self-pay | Admitting: Primary Care

## 2024-02-10 ENCOUNTER — Ambulatory Visit: Admitting: Primary Care

## 2024-02-10 ENCOUNTER — Ambulatory Visit

## 2024-02-10 VITALS — BP 134/72 | HR 71 | Temp 97.6°F | Ht 66.0 in | Wt 388.0 lb

## 2024-02-10 DIAGNOSIS — R0602 Shortness of breath: Secondary | ICD-10-CM

## 2024-02-10 DIAGNOSIS — G4733 Obstructive sleep apnea (adult) (pediatric): Secondary | ICD-10-CM | POA: Diagnosis not present

## 2024-02-10 DIAGNOSIS — J9611 Chronic respiratory failure with hypoxia: Secondary | ICD-10-CM

## 2024-02-10 LAB — BASIC METABOLIC PANEL WITH GFR
BUN: 22 mg/dL (ref 6–23)
CO2: 31 meq/L (ref 19–32)
Calcium: 9 mg/dL (ref 8.4–10.5)
Chloride: 101 meq/L (ref 96–112)
Creatinine, Ser: 0.97 mg/dL (ref 0.40–1.20)
GFR: 67.1 mL/min
Glucose, Bld: 160 mg/dL — ABNORMAL HIGH (ref 70–99)
Potassium: 3.8 meq/L (ref 3.5–5.1)
Sodium: 140 meq/L (ref 135–145)

## 2024-02-10 LAB — BRAIN NATRIURETIC PEPTIDE: Pro B Natriuretic peptide (BNP): 34 pg/mL (ref 1.0–100.0)

## 2024-02-10 NOTE — Progress Notes (Signed)
 "  @Patient  ID: [name], female    DOB: 1970-04-09, 54 y.o.   MRN: [id number]  No chief complaint on file.   Referring provider: Shepperson, Kirstin, PA-C  HPI: 54 year old, never smoked. PMH significant for HTN, CAD, chronic diastolic heart failure, chronic respiratory failure, type 2 diabetes, hyperlipidemia.   Originally seen in October 2023 for dyspnea, chronic hypoxic respiratory failure.  She has history significant for coronary artery disease s/p stenting of her LAD in 2021 after non-ST elevation MI.  She has been on oxygen therapy since then.  Currently on 2 L oxygen 24/7   Pets: No pets Occupation: Used to work as a education administrator.  Went on disability in 2014 Exposures: No mold, hot tub, Jacuzzi.  No feather pillows or comforters Smoking history: Never smoker Travel history: Originally from [place] .  No recent travel Relevant family history: No family history of lung disease  Previous [facility] pulmonary encounter: 04/23/23- Dr. Theophilus  Discussed the use of AI scribe software for clinical note transcription with the patient, who gave verbal consent to proceed.  History of Present Illness [name] is a 54 year old female with heart failure who presents with difficulty breathing.  She experiences increased difficulty breathing, particularly during physical activity, and becomes winded more quickly. There is a sensation of heaviness in the middle of her chest. She uses three liters of oxygen during the day and increases to four liters at night to aid her breathing during sleep.  She is not currently using CPAP or BiPAP therapy. She wants to complete a sleep study to address her concerns about breathing difficulties during sleep. She has attempted to reschedule a missed sleep study appointment but has not received a response.  She has a history of depression, which has worsened following the recent deaths of her mother and brother from cancer. She takes two  pills daily for her depression and reports some improvement, though she continues to work on managing her mental health.   02/10/2024- Acute OV/ shortness of breath  Discussed the use of AI scribe software for clinical note transcription with the patient, who gave verbal consent to proceed.  History of Present Illness [name] is a 53 year old female with sleep apnea and chronic respiratory failure who presents for follow-up regarding her respiratory conditions. She is accompanied by her boyfriend.  She has a history of sleep apnea and chronic respiratory failure, initially presenting in October 2023 with shortness of breath. She has been on oxygen therapy since 2021 following a non-ST elevated myocardial infarction and stent placement in her LAD. Currently, she is on 4 liters of oxygen. Patient underwent CPAP titration study on October 16, 2023, recommending a BiPAP with a pressure of 21/17 and a medium full face mask with 4 liters of oxygen, she has not yet received the machine. She experiences difficulty sleeping, waking with palpitations, and shortness of breath both day and night.  She has a history of heart failure and is on Lasix  once daily. Recent weight gain was noted, with her weight at 388 pounds during a recent endocrinology visit. She has started Ozempic  at a dose of 2.5 mg, experiencing diarrhea and upset stomach but no vomiting. She is cautious about using Imodium due to existing constipation issues.  She reports significant swelling in her legs, particularly the left, which she states remains larger and does not reduce in size. She spends a lot of time in bed. She has a history of cellulitis in  her feet due to ingrown toenails and is on aspirin  and metoprolol . She has difficulty using compression stockings due to the risk of tearing blisters on her legs.  She reports a persistent cough with mucus, especially at night, causing frequent awakenings. She experiences dryness in her  nose and sometimes chokes while eating or talking. No history of smoking is noted. Occasional chest pain and pressure in the middle of her chest and sharp pains on the right side of her breast occur when she coughs or breathes deeply.  She has a history of coronary artery disease with a stent placed in her LAD in 2021 after a non-ST elevated myocardial infarction. No recent ultrasounds of her legs have been performed, and she has not visited a lymphedema clinic. She has a history of a fractured ankle as a child but no other significant leg injuries. Additionally, she reports a history of a ruptured disc, degenerative disc disorder, and sciatic nerve issues, contributing to her difficulty walking.   Allergies[1]  There is no immunization history for the selected administration types on file for this patient.  Past Medical History:  Diagnosis Date   CAD (coronary artery disease)    a. s/p NSTEMI, LHC 03/2020 dRCA 25%, m-LAD-1 95%-0% (DES), mLAD-2 50%, LVEDP moderately elevated, 27 mmHg.   Chronic diastolic heart failure (HCC)    a. LHC/echo 3/22 c/ elevated LVEDP, EF 50-55%, hypokinesis; b. Echo 10/22 EF 60-65%, mild LVH, borderline dilatation of ascending aorta, 39mm.   DDD (degenerative disc disease), lumbar    Diabetes mellitus without complication (HCC)    GERD (gastroesophageal reflux disease)    Hyperlipidemia    Hypertension    Hypothyroidism    Lumbar herniated disc    Major depressive disorder    Morbid obesity (HCC)     Tobacco History: Tobacco Use History[2] Counseling given: Not Answered   Outpatient Medications Prior to Visit  Medication Sig Dispense Refill   acetaminophen  (TYLENOL ) 500 MG tablet Take 1,000 mg by mouth every 6 (six) hours as needed for moderate pain or headache.     aspirin  EC 81 MG tablet Take 81 mg by mouth daily. Swallow whole.     B Complex-C (SUPER B COMPLEX PO) Take 1 tablet by mouth daily.     Cholecalciferol (VITAMIN D3) 50 MCG (2000 UT) capsule  Take 2,000 Units by mouth daily.     Continuous Glucose Sensor (FREESTYLE LIBRE 3 PLUS SENSOR) MISC Inject 1 Device into the skin continuous. Change every 15 days 6 each 3   Continuous Glucose Sensor (FREESTYLE LIBRE 3 PLUS SENSOR) MISC Inject 1 Device into the skin continuous. Change every 15 days 6 each 3   Continuous Glucose Sensor (FREESTYLE LIBRE 3 SENSOR) MISC APPLY 1 SENSOR ON UPPER ARM EVERY 14 DAYS FOR CONTINUOUS GLUCOSE MONITORING 2 each 3   DULoxetine  (CYMBALTA ) 30 MG capsule Take 30 mg by mouth daily.     empagliflozin  (JARDIANCE ) 10 MG TABS tablet Take 1 tablet (10 mg total) by mouth daily. 90 tablet 0   furosemide  (LASIX ) 40 MG tablet Take 1 tablet by mouth once daily 90 tablet 3   gabapentin  (NEURONTIN ) 300 MG capsule Take 2 capsules by mouth 3 (three) times daily.     HUMULIN  R U-500 KWIKPEN 500 UNIT/ML KwikPen INJECT 140 UNITS INTO THE SKIN BEFORE BREAKFAST AND 140 UNITS BEFORE SUPPER 30 MINUTES BEFORE MEALS 12 mL 0   Insulin  Syringes, Disposable, U-100 1 ML MISC Use to inject insulin  daily 100 each 3  levothyroxine  (SYNTHROID , LEVOTHROID) 50 MCG tablet Take 50 mcg by mouth daily.  3   metFORMIN  (GLUCOPHAGE -XR) 500 MG 24 hr tablet TAKE 4 TABLETS BY MOUTH WITH BREAKFAST 360 tablet 0   metoprolol  succinate (TOPROL -XL) 50 MG 24 hr tablet Take 1 tablet by mouth once daily 90 tablet 2   nystatin  cream (MYCOSTATIN ) Apply 1 application  topically 2 (two) times daily. Abdominal folds     omeprazole (PRILOSEC) 40 MG capsule Take 40 mg by mouth 2 (two) times daily.     OZEMPIC , 0.25 OR 0.5 MG/DOSE, 2 MG/3ML SOPN INJECT 0.25 MG SUBCUTANEOUSLY ONCE A WEEK 3 mL 0   potassium chloride  (KLOR-CON ) 10 MEQ tablet Take 2 tablets by mouth once daily 60 tablet 8   RELION PEN NEEDLES 31G X 6 MM MISC USE 1  TWICE DAILY 100 each 0   rosuvastatin  (CRESTOR ) 40 MG tablet Take 40 mg by mouth daily.     Semaglutide ,0.25 or 0.5MG /DOS, (OZEMPIC , 0.25 OR 0.5 MG/DOSE,) 2 MG/3ML SOPN Inject 0.5 mg into the skin  once a week. 3 mL 0   Sennosides 17.2 MG TABS Take 34.4 mg by mouth 3 (three) times a week. No set days     No facility-administered medications prior to visit.   Review of Systems  Review of Systems  Constitutional: Negative.   HENT: Negative.    Respiratory:  Positive for cough and shortness of breath.   Cardiovascular:  Positive for leg swelling.   Physical Exam  There were no vitals taken for this visit. Physical Exam Constitutional:      General: She is not in acute distress.    Appearance: She is well-developed. She is obese.  HENT:     Head: Normocephalic and atraumatic.  Cardiovascular:     Rate and Rhythm: Normal rate.  Pulmonary:     Effort: Pulmonary effort is normal. No accessory muscle usage or respiratory distress.     Breath sounds: Normal breath sounds. No decreased breath sounds, wheezing, rhonchi or rales.     Comments: 4L continuous oxygen  Musculoskeletal:        General: Normal range of motion.     Right lower leg: Edema present.     Left lower leg: Edema present.     Comments: Blisters left lower extremity - intact  Mild-moderate erythema   Skin:    General: Skin is warm and dry.  Neurological:     General: No focal deficit present.     Mental Status: She is alert and oriented to person, place, and time.  Psychiatric:        Mood and Affect: Mood normal.        Behavior: Behavior normal.      Lab Results:  CBC    Component Value Date/Time   WBC 9.2 08/25/2023 1034   WBC 9.0 07/21/2021 2026   RBC 4.44 08/25/2023 1034   RBC 3.86 (L) 07/21/2021 2026   HGB 13.1 08/25/2023 1034   HCT 40.3 08/25/2023 1034   PLT 196 08/25/2023 1034   MCV 91 08/25/2023 1034   MCH 29.5 08/25/2023 1034   MCH 26.4 07/21/2021 2026   MCHC 32.5 08/25/2023 1034   MCHC 30.7 07/21/2021 2026   RDW 14.2 08/25/2023 1034   LYMPHSABS 2.3 04/09/2021 0233   MONOABS 0.7 04/09/2021 0233   EOSABS 0.3 04/09/2021 0233   BASOSABS 0.0 04/09/2021 0233    BMET    Component  Value Date/Time   NA 145 (H) 08/25/2023 1034  K 4.0 08/25/2023 1034   CL 102 08/25/2023 1034   CO2 25 08/25/2023 1034   GLUCOSE 101 (H) 08/25/2023 1034   GLUCOSE 100 (H) 11/01/2022 1053   BUN 26 (H) 08/25/2023 1034   CREATININE 1.32 (H) 08/25/2023 1034   CALCIUM  9.2 08/25/2023 1034   GFRNONAA >60 07/21/2021 2026   GFRAA >60 06/23/2014 0357    BNP    Component Value Date/Time   BNP 50.6 07/22/2021 0141    ProBNP    Component Value Date/Time   PROBNP 13.0 11/15/2021 0937    Imaging: No results found.   Assessment & Plan:   1. Chronic respiratory failure with hypoxia (HCC) (Primary) - Ambulatory Referral for DME - Brain natriuretic peptide; Future - Basic metabolic panel with GFR; Future - D-Dimer, Quantitative; Future - DG Chest 2 View; Future  2. OSA (obstructive sleep apnea) - Ambulatory Referral for DME  3. Shortness of breath - Brain natriuretic peptide; Future - Basic metabolic panel with GFR; Future - D-Dimer, Quantitative; Future - DG Chest 2 View; Future   Assessment and Plan Assessment & Plan Dyspnea with chronic respiratory failure with hypoxia Chronic respiratory failure with hypoxia, managed with 4 liters of oxygen. Reports shortness of breath both during the day and at night. No evidence of obstructive lung disease on previous pulmonary function tests. Mild restriction likely weight-related. Reports cough with mucus and dryness in the nose. No smoking history. No underlying COPD or asthma. Heart failure and obesity may contribute to respiratory symptoms. - Continue oxygen therapy at 4 liters. - Ordered chest x-ray, BNP and D-dimer   Obstructive sleep apnea Patient underwent CPAP titration study in September 2025 recommended BiPAP with pressure settings of 21/17 cm H2O and 4 liters of oxygen blended in. Reports difficulty sleeping and waking up with palpitations. No current CPAP or BiPAP machine. Sleep apnea can increase risk for cardiac arrhythmia,  stroke, pulmonary hypertension, and heart enlargement. - Ordered BiPAP machine with recommended settings 21/17cm h20 and 4L oxygen blended into PAP therapy. - Advised to use BiPAP for a minimum of 4-6 hours per day, including naps and nighttime sleep. - Advised to contact [facility] for equipment delivery. - Scheduled follow-up in 8 weeks to check compliance with BiPAP use.  Morbid obesity Recent weight of 388 lbs. Recently started on Ozempic  2.5 mg for weight loss. Reports gastrointestinal side effects including diarrhea and stomach upset. Discussed potential for constipation and management strategies. Weight loss is crucial for improving respiratory function and overall health. Discussed alternative GLP-1 medication, Zepbound, if Ozempic  is not tolerated. - Continue Ozempic  0.25 mg/2ml weekly as tolerated. - Advised BRAT diet and Imodium for diarrhea management. - Advised Miralax  for constipation management. - Discussed potential switch to Zepbound if Ozempic  is not tolerated. - Encouraged weight loss efforts to improve respiratory function and overall health.  I personally spent a total of 40 minutes in the care of the patient today including performing a medically appropriate exam/evaluation, counseling and educating, placing orders, documenting clinical information in the EHR, independently interpreting results, and coordinating care.  Caitlin LELON Ferrari, NP 02/10/2024     [1]  Allergies Allergen Reactions   Irbesartan  Other (See Comments)    Dizziness, muscle pain   Clindamycin/Lincomycin Other (See Comments) and Nausea And Vomiting    Stomach pain    Latex Rash   Ultram [Tramadol] Rash  [2]  Social History Tobacco Use  Smoking Status Never   Passive exposure: Past  Smokeless Tobacco Never   "

## 2024-02-10 NOTE — Patient Instructions (Addendum)
" °  VISIT SUMMARY: During your visit, we discussed your ongoing respiratory conditions, including chronic respiratory failure and obstructive sleep apnea, as well as your recent weight gain and related health concerns. We reviewed your current treatments and made several recommendations to help manage your symptoms and improve your overall health.  YOUR PLAN: -CHRONIC RESPIRATORY FAILURE WITH HYPOXIA: Chronic respiratory failure with hypoxia means your body is not getting enough oxygen. We will continue your oxygen therapy at 4 liters. We have ordered a chest x-ray to check for fluid in your lungs, a BMP to check your fluid levels, a D-dimer test. Please discuss with your primary care doctor about getting an ultrasound or a referral to a vascular or lymphedema clinic for your leg swelling.  -OBSTRUCTIVE SLEEP APNEA: Obstructive sleep apnea is a condition where your breathing stops and starts during sleep. We have ordered a BiPAP machine with the recommended settings and oxygen blending. Please use the BiPAP for at least 4-6 hours per day, including naps and nighttime sleep. Contact [facility] for equipment delivery. We will follow up in 8 weeks to check your compliance with BiPAP use.  -MORBID OBESITY: Morbid obesity means having a body weight that is significantly higher than what is considered healthy. You recently started on Ozempic  2.5 mg for weight loss and have experienced some gastrointestinal side effects. Continue taking Ozempic  if you can tolerate it. For diarrhea, follow the BRAT diet and use Imodium if needed. For constipation, use Miralax . If you cannot tolerate Ozempic , we can discuss switching to Zepbound. Weight loss is important for improving your respiratory function and overall health.  INSTRUCTIONS: Please follow up with your primary care doctor about getting an ultrasound or a referral to a vascular or lymphedema clinic for your leg swelling. Use the BiPAP machine for at least 4-6  hours per day and contact [facility] for equipment delivery. We will see you again in 8 weeks to check your compliance with BiPAP use.      "

## 2024-02-11 ENCOUNTER — Ambulatory Visit: Payer: Self-pay | Admitting: Primary Care

## 2024-02-11 LAB — D-DIMER, QUANTITATIVE: D-Dimer, Quant: 0.26 ug{FEU}/mL

## 2024-02-11 NOTE — Progress Notes (Signed)
 Labs were normal including d-dimer and bnp

## 2024-02-12 NOTE — Progress Notes (Signed)
 Pt was called and informed of note. Pt verbalized understanding. NFN

## 2024-02-16 NOTE — Progress Notes (Signed)
 Please let patient know CXR showed no evidence of pneumonia, pleural effusion or lung collapse. Low lung volume (lungs are under expanded) likely due to weight. NFN at the time

## 2024-02-18 ENCOUNTER — Other Ambulatory Visit: Payer: Self-pay | Admitting: "Endocrinology

## 2024-02-18 DIAGNOSIS — E1165 Type 2 diabetes mellitus with hyperglycemia: Secondary | ICD-10-CM

## 2024-04-01 ENCOUNTER — Ambulatory Visit: Admitting: Pulmonary Disease
